# Patient Record
Sex: Male | Born: 1965
Health system: Southern US, Community
[De-identification: ages and names within clinical notes are randomized; demographics above are authoritative.]

## PROBLEM LIST (undated history)

## (undated) DIAGNOSIS — I10 Essential (primary) hypertension: Secondary | ICD-10-CM

## (undated) DIAGNOSIS — A159 Respiratory tuberculosis unspecified: Secondary | ICD-10-CM

## (undated) DIAGNOSIS — Z21 Asymptomatic human immunodeficiency virus [HIV] infection status: Secondary | ICD-10-CM

## (undated) DIAGNOSIS — F1910 Other psychoactive substance abuse, uncomplicated: Secondary | ICD-10-CM

## (undated) HISTORY — DX: Other psychoactive substance abuse, uncomplicated: F19.10

## (undated) HISTORY — DX: Essential (primary) hypertension: I10

## (undated) HISTORY — DX: Respiratory tuberculosis unspecified: A15.9

## (undated) HISTORY — DX: Asymptomatic human immunodeficiency virus (hiv) infection status: Z21

## (undated) HISTORY — PX: OTHER SURGICAL HISTORY: SHX169

---

## 2019-07-02 IMAGING — CR DG LUMBAR SPINE COMPLETE 4+V
5 series · 5 of 5 positions shown · non-contrast
Comparison: None.

CLINICAL DATA: Lumbar back pain.

EXAM:
LUMBAR SPINE - COMPLETE 4+ VIEW

[l-spine ap]
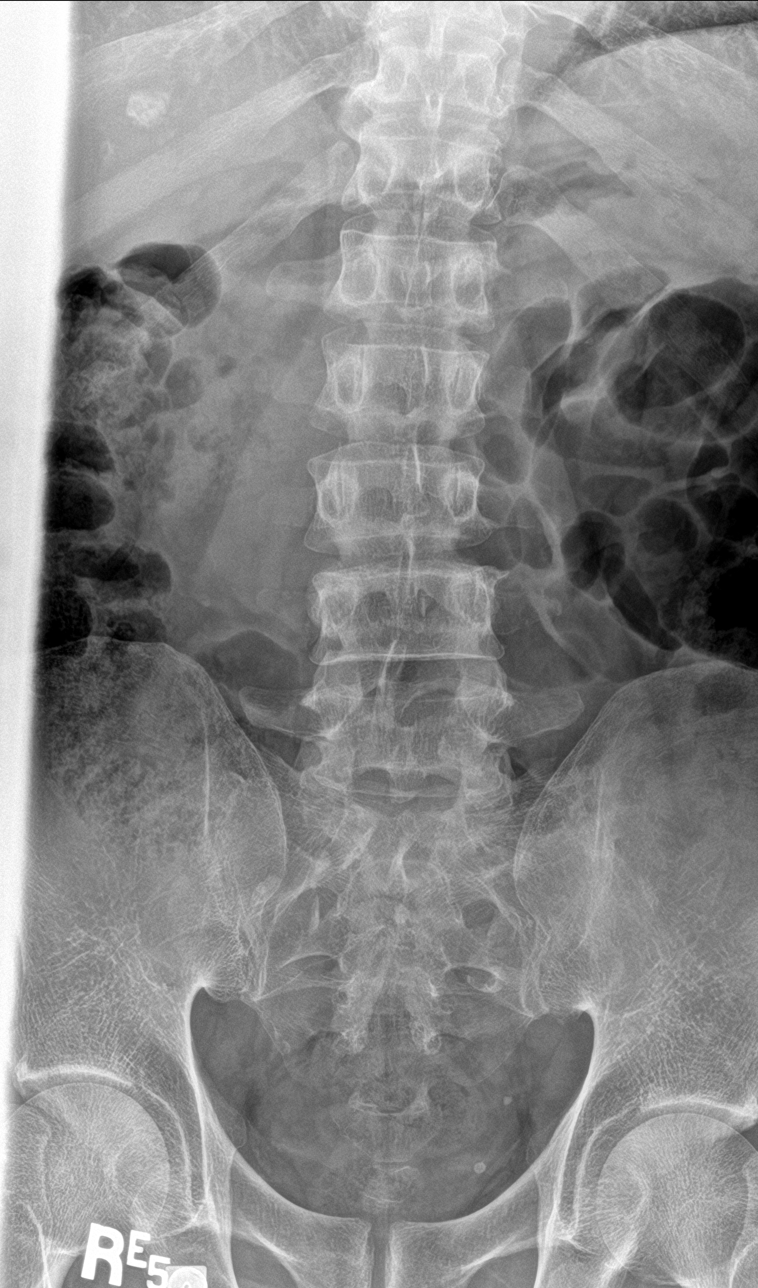

[l-spine obl (1 of 2)]
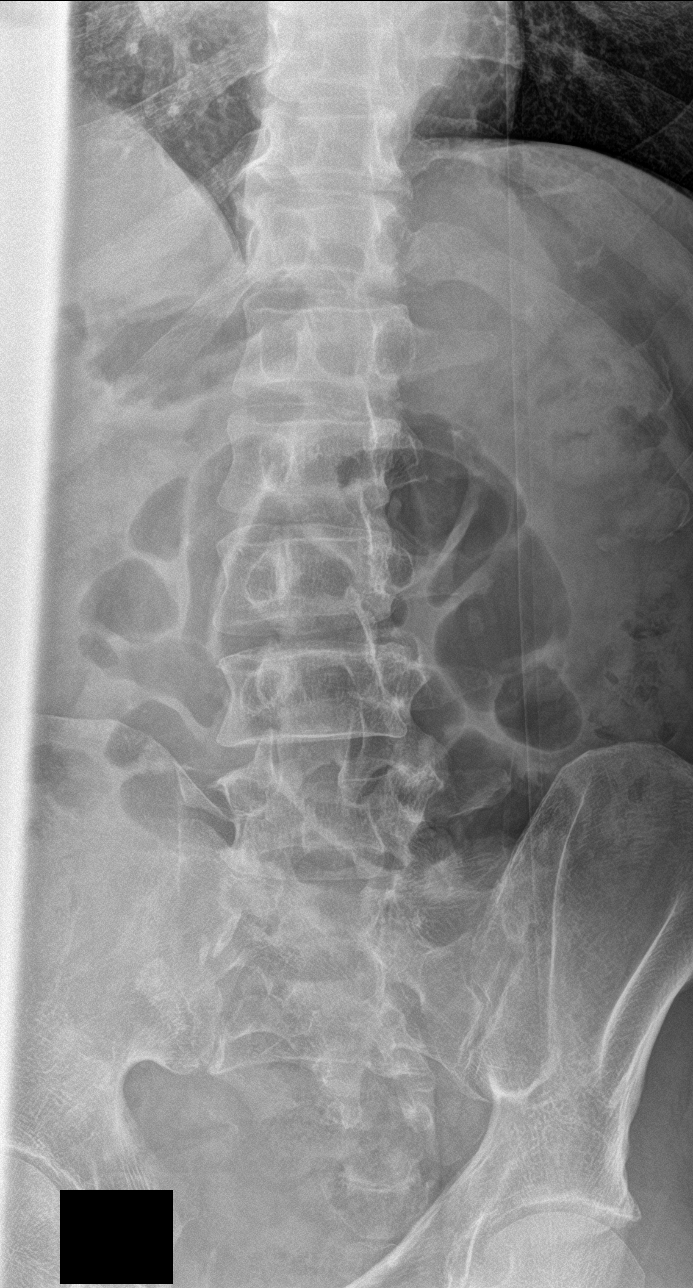

[l-spine obl (2 of 2)]
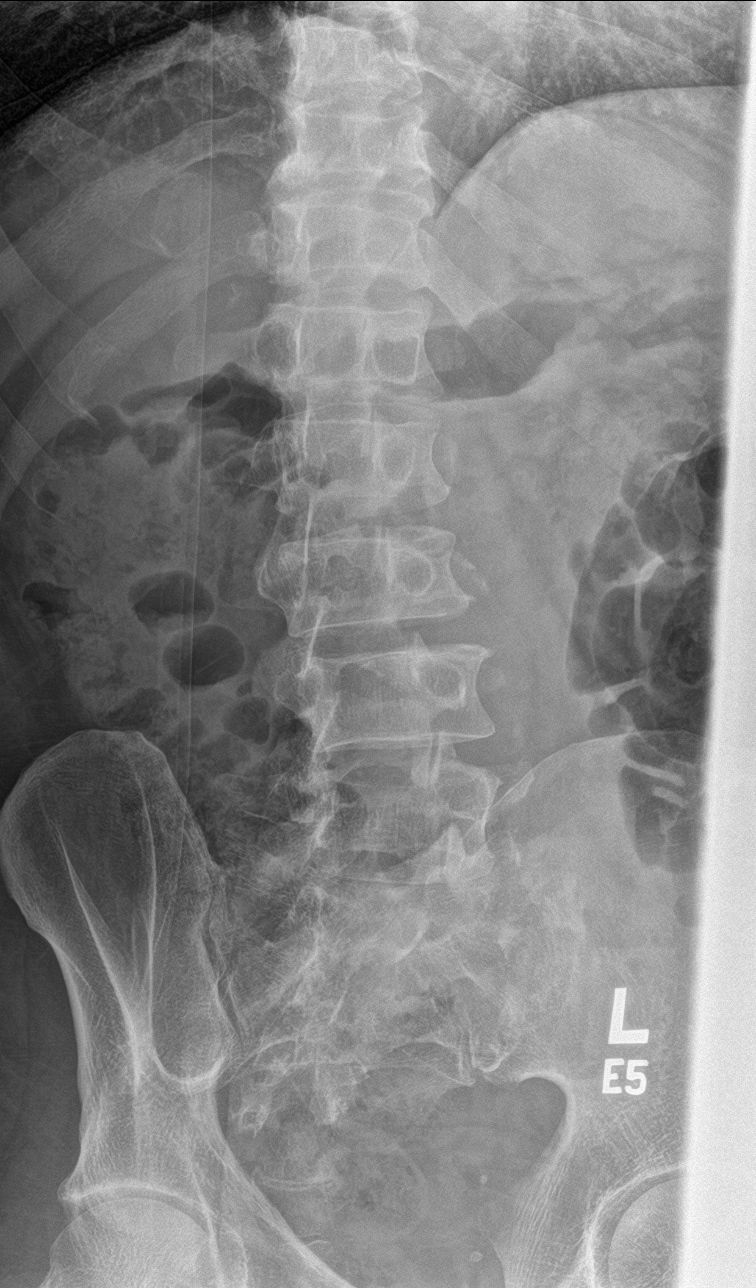

[l-spine lat]
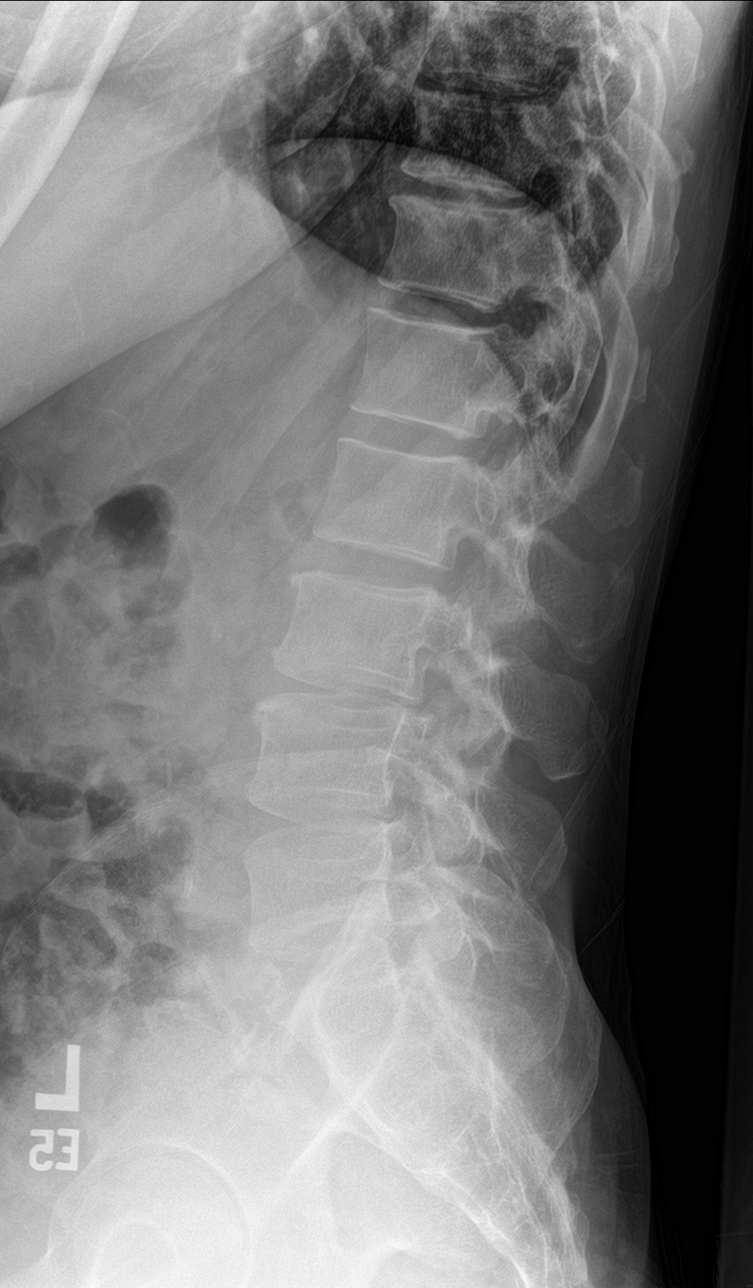

[l-spine spot]
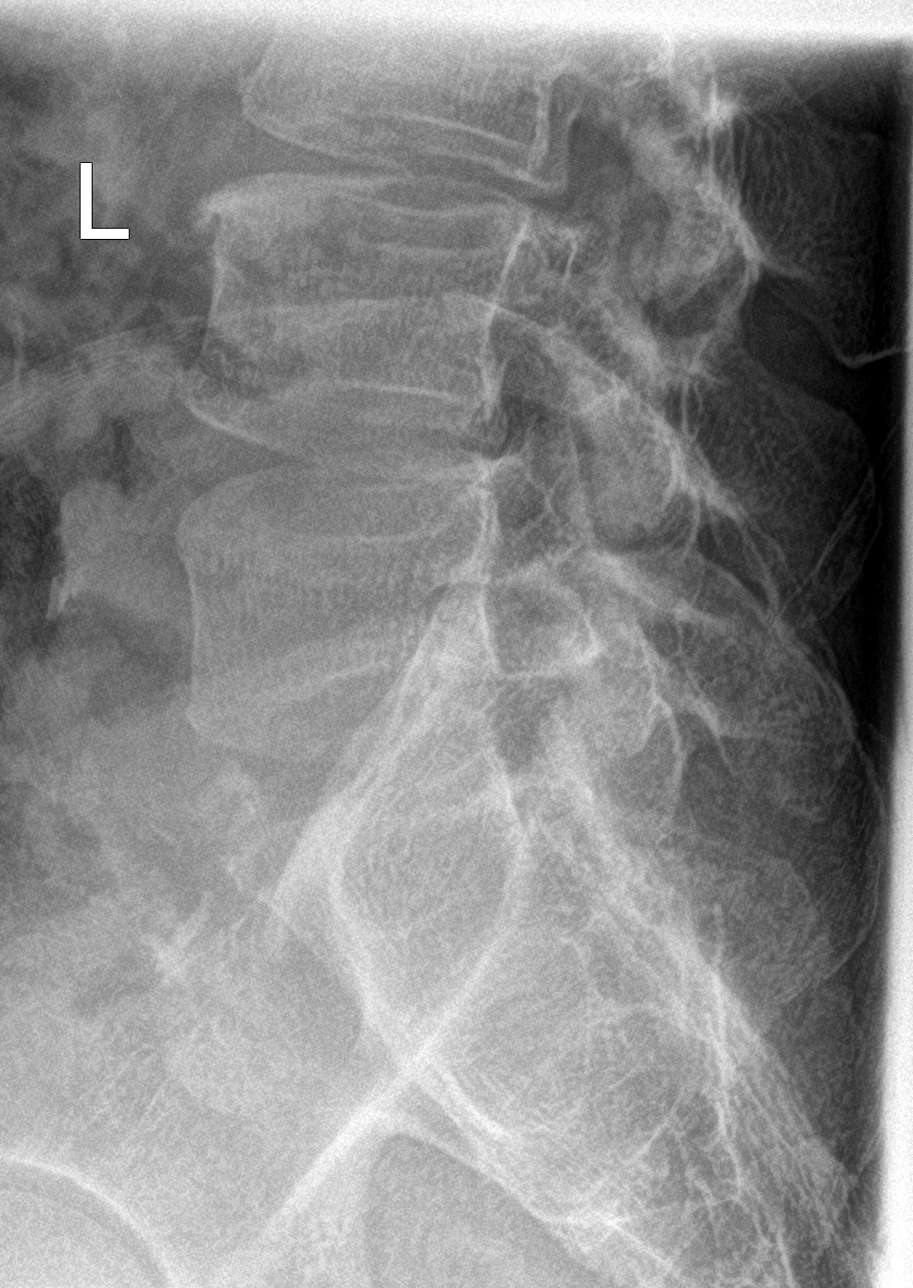

[5 of 5 positions shown; findings below may reference images not displayed]

FINDINGS: There is no evidence for acute fracture. Alignment is anatomic. Disc
spaces are preserved. There are mild degenerative endplate
osteophytes of the mid and lower lumbar spine. Soft tissues are
within normal limits. No focal osseous lesions are seen.
IMPRESSION: No acute fracture or malalignment.

Minimal degenerative changes.

## 2020-09-17 ENCOUNTER — Encounter: Payer: Self-pay | Admitting: Critical Care Medicine

## 2020-09-18 NOTE — Progress Notes (Addendum)
Patient ID: Paul Blake, male   DOB: 1965/08/07, 55 y.o.   MRN: [id number] This is a 55 year old male who is an new resident for 1 week at the [facility].  He just got out of prison from a correctional facility in [place] February 15.  Patient has HIV for the past 33 years and is currently on triple therapy and has an upcoming appointment March 17 at the HIV [facility] clinic.  He does have enough supply medications to last until that visit.  He brings with him a stack of medical records during his prison term.  He was followed closely by a physician in the prison system.  He is triple vaccinated with Materna vaccine.  Patient also has history of hypertension was on hydrochlorthiazide however this was stopped during the imprisonment last blood pressure in the prison was 120/70.  The patient does complain of some headaches  On arrival blood pressure 154/96 pulse 97 saturation 97% room air  Patient is a well-appearing male in no acute distress chest is clear cardiac exam unremarkable Neurologic exam unremarkable  Plan is to get this patient to my clinic to establish for primary care and then as well get this patient in 2 [facility] for his HIV treatment he has very poor dentition including several cavities he had some dental work done in the prison system which was not successful apparently there is dental care access to [employer] and we will endeavor to access that care if not he will have to obtain an orange card  As to the blood pressure will observe for now I have asked the nurse to check several blood pressures on him if he remains elevated we will resume hydrochlorothiazide therapy for his blood pressure

## 2020-09-25 ENCOUNTER — Ambulatory Visit: Payer: Self-pay

## 2020-09-25 ENCOUNTER — Other Ambulatory Visit: Payer: Self-pay

## 2020-09-25 ENCOUNTER — Other Ambulatory Visit (HOSPITAL_COMMUNITY)
Admission: RE | Admit: 2020-09-25 | Discharge: 2020-09-25 | Disposition: A | Discharge status: Home / Self Care | Payer: Medicaid Other | Source: Ambulatory Visit | Attending: Internal Medicine | Admitting: Internal Medicine

## 2020-09-25 DIAGNOSIS — Z79899 Other long term (current) drug therapy: Secondary | ICD-10-CM

## 2020-09-25 DIAGNOSIS — B20 Human immunodeficiency virus [HIV] disease: Secondary | ICD-10-CM | POA: Insufficient documentation

## 2020-09-25 DIAGNOSIS — Z113 Encounter for screening for infections with a predominantly sexual mode of transmission: Secondary | ICD-10-CM | POA: Insufficient documentation

## 2020-09-26 ENCOUNTER — Encounter: Payer: Self-pay | Admitting: Internal Medicine

## 2020-09-26 LAB — URINE CYTOLOGY ANCILLARY ONLY
Chlamydia: NEGATIVE
Comment: NEGATIVE
Comment: NORMAL
Neisseria Gonorrhea: NEGATIVE

## 2020-09-26 LAB — URINALYSIS
Bilirubin Urine: NEGATIVE
Glucose, UA: NEGATIVE
Hgb urine dipstick: NEGATIVE
Ketones, ur: NEGATIVE
Leukocytes,Ua: NEGATIVE
Nitrite: NEGATIVE
Protein, ur: NEGATIVE
Specific Gravity, Urine: 1.011 (ref 1.001–1.03)
pH: 7 (ref 5.0–8.0)

## 2020-09-26 LAB — T-HELPER CELL (CD4) - (RCID CLINIC ONLY)
CD4 % Helper T Cell: 18 % — ABNORMAL LOW (ref 33–65)
CD4 T Cell Abs: 283 /uL — ABNORMAL LOW (ref 400–1790)

## 2020-09-30 LAB — CBC WITH DIFFERENTIAL/PLATELET
Absolute Monocytes: 463 cells/uL (ref 200–950)
Basophils Absolute: 52 cells/uL (ref 0–200)
Basophils Relative: 1 %
Eosinophils Absolute: 109 cells/uL (ref 15–500)
Eosinophils Relative: 2.1 %
HCT: 43.1 % (ref 38.5–50.0)
Hemoglobin: 14.5 g/dL (ref 13.2–17.1)
Lymphs Abs: 1664 cells/uL (ref 850–3900)
MCH: 28.7 pg (ref 27.0–33.0)
MCHC: 33.6 g/dL (ref 32.0–36.0)
MCV: 85.2 fL (ref 80.0–100.0)
MPV: 12 fL (ref 7.5–12.5)
Monocytes Relative: 8.9 %
Neutro Abs: 2912 cells/uL (ref 1500–7800)
Neutrophils Relative %: 56 %
Platelets: 170 10*3/uL (ref 140–400)
RBC: 5.06 10*6/uL (ref 4.20–5.80)
RDW: 14.3 % (ref 11.0–15.0)
Total Lymphocyte: 32 %
WBC: 5.2 10*3/uL (ref 3.8–10.8)

## 2020-09-30 LAB — QUANTIFERON-TB GOLD PLUS
Mitogen-NIL: >10 IU/mL
NIL: 0.2 IU/mL
QuantiFERON-TB Gold Plus: POSITIVE — AB
TB1-NIL: 2.89 IU/mL
TB2-NIL: 2.89 IU/mL

## 2020-09-30 LAB — COMPLETE METABOLIC PANEL WITH GFR
AG Ratio: 1.8 (calc) (ref 1.0–2.5)
ALT: 55 U/L — ABNORMAL HIGH (ref 9–46)
AST: 30 U/L (ref 10–35)
Albumin: 4.6 g/dL (ref 3.6–5.1)
Alkaline phosphatase (APISO): 82 U/L (ref 35–144)
BUN: 11 mg/dL (ref 7–25)
CO2: 28 mmol/L (ref 20–32)
Calcium: 9.6 mg/dL (ref 8.6–10.3)
Chloride: 107 mmol/L (ref 98–110)
Creat: 0.81 mg/dL (ref 0.70–1.33)
GFR, Est African American: 118 mL/min/{1.73_m2} (ref >=60)
GFR, Est Non African American: 101 mL/min/{1.73_m2} (ref >=60)
Globulin: 2.6 g/dL (calc) (ref 1.9–3.7)
Glucose, Bld: 93 mg/dL (ref 65–99)
Potassium: 4.2 mmol/L (ref 3.5–5.3)
Sodium: 141 mmol/L (ref 135–146)
Total Bilirubin: 0.5 mg/dL (ref 0.2–1.2)
Total Protein: 7.2 g/dL (ref 6.1–8.1)

## 2020-09-30 LAB — LIPID PANEL
Cholesterol: 210 mg/dL — ABNORMAL HIGH (ref <200)
HDL: 55 mg/dL (ref >=40)
LDL Cholesterol (Calc): 129 mg/dL (calc) — ABNORMAL HIGH
Non-HDL Cholesterol (Calc): 155 mg/dL (calc) — ABNORMAL HIGH (ref <130)
Total CHOL/HDL Ratio: 3.8 (calc) (ref <5.0)
Triglycerides: 144 mg/dL (ref <150)

## 2020-09-30 LAB — HIV-1 RNA ULTRAQUANT REFLEX TO GENTYP+
HIV 1 RNA Quant: NOT DETECTED copies/mL
HIV-1 RNA Quant, Log: NOT DETECTED Log copies/mL

## 2020-09-30 LAB — HEPATITIS B SURFACE ANTIGEN: Hepatitis B Surface Ag: NONREACTIVE

## 2020-09-30 LAB — RPR: RPR Ser Ql: NONREACTIVE

## 2020-09-30 LAB — HIV ANTIBODY (ROUTINE TESTING W REFLEX): HIV 1&2 Ab, 4th Generation: REACTIVE — AB

## 2020-09-30 LAB — HLA B*5701: HLA-B*5701 w/rflx HLA-B High: NEGATIVE

## 2020-09-30 LAB — HEPATITIS B CORE ANTIBODY, TOTAL: Hep B Core Total Ab: NONREACTIVE

## 2020-09-30 LAB — HEPATITIS C ANTIBODY
Hepatitis C Ab: NONREACTIVE
SIGNAL TO CUT-OFF: 0.01 (ref <1.00)

## 2020-09-30 LAB — HEPATITIS B SURFACE ANTIBODY,QUALITATIVE: Hep B S Ab: NONREACTIVE

## 2020-09-30 LAB — HIV-1/2 AB - DIFFERENTIATION
HIV-1 antibody: POSITIVE — AB
HIV-2 Ab: NEGATIVE

## 2020-09-30 LAB — HEPATITIS A ANTIBODY, TOTAL: Hepatitis A AB,Total: REACTIVE — AB

## 2020-10-01 ENCOUNTER — Encounter: Payer: Self-pay | Admitting: *Deleted

## 2020-10-01 ENCOUNTER — Other Ambulatory Visit (HOSPITAL_COMMUNITY): Payer: Self-pay | Admitting: Critical Care Medicine

## 2020-10-01 ENCOUNTER — Other Ambulatory Visit: Payer: Self-pay | Admitting: Critical Care Medicine

## 2020-10-01 ENCOUNTER — Encounter: Payer: Self-pay | Admitting: Critical Care Medicine

## 2020-10-01 MED ORDER — VALSARTAN-HYDROCHLOROTHIAZIDE 80-12.5 MG PO TABS: 1.0000 | ORAL_TABLET | Freq: Every day | ORAL | 1 refills | Status: DC

## 2020-10-01 MED FILL — VALSARTAN-HCTZ 80-12.5 MG T: 80-12.5 | 30 days supply | Qty: 30 | Fill #0

## 2020-10-01 NOTE — Progress Notes (Signed)
Patient ID: Paul Blake, male   DOB: [birth year], 55 y.o.   MRN: [id number] This is a 55 year old male history of HIV seen at the [facility] clinic.  He has had his initial intake at the [facility] and will have his physician visit in a week.  The patient currently has labs showing no viral load and all of his other S TI infections are negative  On arrival blood pressure 160/110 pulse 75 saturation 98% room air patient's exam is unremarkable  Impression is that of hypertension with HIV  Plan is for the patient to begin valsartan HCT 80/12-1/2 1 daily prescription will be sent to the pharmacy under the nurse find  The patient has an established visit with me March 29 in the clinic and he is encouraged to keep this follow-up appointment

## 2020-10-01 NOTE — Progress Notes (Signed)
valsa

## 2020-10-01 NOTE — Congregational Nurse Program (Signed)
  Dept: [phone]   Congregational Nurse Program Note  Date of Encounter: 10/01/2020  Past Medical History: No past medical history on file.  Encounter Details:  CNP Questionnaire - 10/01/20 1049      Questionnaire   Do you give verbal consent to treat you today? Yes    Visit Setting Other    Location [facility] At [facility]    Patient Status Homeless    Medical Provider No    Insurance Medicaid    Intervention Educate    Housing/Utilities No permanent housing    Transportation Within past 12 months, lack of transportation negatively impacted life    Interpersonal Safety Do not feel physically and emotionally safe where you currently live    Food Have food insecurities           Pt presents and is confused about upcoming appts, wrote down map and directions, address, ph# to [facility]. Wrote down date and time for dr Delford Field [facility]. And next visit w/ [facility].  He states he would also like to see dr Delford Field this pm for continued h/a's, he is first on list

## 2020-10-08 ENCOUNTER — Telehealth: Payer: Self-pay

## 2020-10-08 NOTE — Telephone Encounter (Signed)
[facility] Patient Advocate Encounter ? ?Insurance verification completed.   ? ?The patient is uninsured and will need patient assistance for medication. ? ?We can complete the application and will need to meet with the patient for signatures and income documentation. ? ?Izzah Pasqua, CPhT ?Specialty Pharmacy Patient Advocate ?[facility] ?Phone: [phone] ?Fax:  [phone]  ?

## 2020-10-09 ENCOUNTER — Ambulatory Visit (INDEPENDENT_AMBULATORY_CARE_PROVIDER_SITE_OTHER): Payer: Self-pay | Admitting: Internal Medicine

## 2020-10-09 ENCOUNTER — Other Ambulatory Visit: Payer: Self-pay

## 2020-10-09 ENCOUNTER — Encounter: Payer: Self-pay | Admitting: Internal Medicine

## 2020-10-09 VITALS — Wt 168.0 lb

## 2020-10-09 DIAGNOSIS — Z23 Encounter for immunization: Secondary | ICD-10-CM

## 2020-10-09 DIAGNOSIS — B20 Human immunodeficiency virus [HIV] disease: Secondary | ICD-10-CM

## 2020-10-09 MED ORDER — BICTEGRAVIR-EMTRICITAB-TENOFOV 50-200-25 MG PO TABS: 1.0000 | ORAL_TABLET | Freq: Every day | ORAL | 11 refills | Status: DC

## 2020-10-09 NOTE — Addendum Note (Signed)
Addended byRutha Bouchard T on: 10/09/2020 02:49 PM   Modules accepted: Orders

## 2020-10-09 NOTE — Progress Notes (Addendum)
[facility]  Reason for Consult: new patient to continue HIV care    There are no problems to display for this patient.     HPI: Paul Blake is a 55 y.o. male here to establish hiv care  He is currently well controlled on prezcobix/descovy  #hiv -dx'ed 1991; got it from his wife; no ivdu -remembers taking bactrim for a long time; but doesn't remember any OI history -previous HIV provider [facility] Lenise Arena most recent prior to going to [place]) -therapy Approximately 2016-Currently on prezcobix/descovy Doesn't recall switching ART regimen previously due to failure Previous therapy include kaletra, atazanavir, lamivudine, truvada, combivir, azt -still have medication from prison  #positive quantiferon gold testing/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 Took inh for 6-9 months 1980s -currently no cough, chest pain, sob -endorse some weight loss (185 lbs --> 169 lbs since prison) - he thinks stress/food related from prison -had good appetite before, but not as good since prison time, lots of stress now looking for place to stay/job -no fever/chill/nightsweat, rash -have chronic knees pain no new joint pain  #diarrhea/fatigue -ate something from [facility] [employer] 2 days ago and developed diarrhea/fatigue, prior to 10/09/2020 visit -diarrhea 2 x/day  -no n/v/abd pain (just the first day -no blood in stool  -the course is improving   #htn -he doesn't really know about this much -med list show hctz   #social -he was recently incarcerated in [place] (2020-2021) prior to transfer here -his wife passed away from AIDS 1990s -just relocated from [place] 08/2020 -no relative here; staying at [employer] -in the process of getting a job; he previously drive trucks in [place] -not in active sexual relationship at this time -smoking; no etoh; no street drugs  Review of Systems: ROS Negative other  ROS      PMH: htn hiv  Social History   Tobacco Use  . Smoking status: Current Every Day Smoker    Types: Cigarettes  . Smokeless tobacco: Never Used  Substance Use Topics  . Alcohol use: Not Currently    Fam hx: No cancer, dm2, rheumatologic disease  OBJECTIVE: Vitals:   10/09/20 1356  Weight: 168 lb (76.2 kg)   There is no height or weight on file to calculate BMI.   Physical Exam Constitutional:      Appearance: He is obese.  HENT:     Head: Normocephalic and atraumatic.     Mouth/Throat:     Mouth: Mucous membranes are moist.     Pharynx: Oropharynx is clear.  Eyes:     Conjunctiva/sclera: Conjunctivae normal.     Pupils: Pupils are equal, round, and reactive to light.  Cardiovascular:     Rate and Rhythm: Normal rate and regular rhythm.     Pulses: Normal pulses.     Heart sounds: Normal heart sounds.  Pulmonary:     Effort: Pulmonary effort is normal.     Breath sounds: Normal breath sounds.  Abdominal:     General: Abdomen is flat.     Palpations: Abdomen is soft.  Musculoskeletal:        General: Normal range of motion.     Cervical back: Normal range of motion.  Skin:    General: Skin is warm.     Findings: No rash.  Neurological:     General: No focal deficit present.     Mental Status: He is alert and oriented to person, place, and time.  Cranial Nerves: No cranial nerve deficit.  Psychiatric:        Mood and Affect: Mood normal.        Behavior: Behavior normal.     Lab: Lab Results  Component Value Date   WBC 5.2 09/25/2020   HGB 14.5 09/25/2020   HCT 43.1 09/25/2020   MCV 85.2 09/25/2020   PLT 170 09/25/2020     Chemistry      Component Value Date/Time   NA 141 09/25/2020 0950   K 4.2 09/25/2020 0950   CL 107 09/25/2020 0950   CO2 28 09/25/2020 0950   BUN 11 09/25/2020 0950   CREATININE 0.81 09/25/2020 0950      Component Value Date/Time   CALCIUM 9.6 09/25/2020 0950   AST 30 09/25/2020 0950   ALT 55 (H)  09/25/2020 0950   BILITOT 0.5 09/25/2020 0950     Lab Results  Component Value Date   CHOL 210 (H) 09/25/2020   HDL 55 09/25/2020   LDLCALC 129 (H) 09/25/2020   TRIG 144 09/25/2020   CHOLHDL 3.8 09/25/2020    hiv          vl      /     cd4 (%) 09/25/20   UD    /     283 (18) 08/220    166 2020-2021 UD with blips here and there  Microbiology:  Serology: 09/25/2020 Quant gold Positive Urine gc/chlam negative rpr nonreactive Hep c Ab, hep b sAg/sAb/cAb nonreactive Hep a Ab reactive  Imaging:   Assessment/plan: Problem List Items Addressed This Visit   None   Visit Diagnoses    HIV disease (HCC)    -  Primary   Encounter for vaccination         #hiv -current therapy prezcobix, descovy; discuss switching to biktarvy. doenst appear prior VF or need to switch regimen due to that. 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 No sx  #recent diarrheal illness Improving Continue to monitor  #hcm -immunization covid vaccine x2 by 11/2019 -hepatitis Previous hepatitis B vaccine 2018; 09/25/2020 hep B sAb negative; will repeat hep b vaccine today 10/09/2020 with heplasav Hep A vaccine 2019 Pneumovax 04/2017 prevnar to give today 10/09/2020 Will save tdap for next visit as he is feeling under the weather at this time   I spent 60 minute reviewing data/chart, and coordinating care and >50% direct face to face time providing counseling/discussing diagnostics/treatment plan with patient    Follow-up: Return in about 5 weeks (around 11/13/2020).  [name], MD [facility] for Infectious Disease [facility] [phone] pager   [phone] cell 10/09/2020, 2:10 PM

## 2020-10-09 NOTE — Addendum Note (Signed)
Addended by: Lorenso Courier on: 10/09/2020 04:18 PM   Modules accepted: Orders

## 2020-10-09 NOTE — Patient Instructions (Signed)
Your hiv viral load is undetectable  Start biktarvy today (1 pill once a day); recheck blood test in 1 month; follow up with me 1 week after (5-6 weeks)  Vaccination today Pneumonia (prevnar) Hepatitis b (heplisav) shot 1 of 2; next visit can get the 2nd dose of hepatitis b vaccine Will also consider given tetatnus booster on next visit

## 2020-10-21 ENCOUNTER — Other Ambulatory Visit: Payer: Self-pay

## 2020-10-21 ENCOUNTER — Encounter: Payer: Self-pay | Admitting: Critical Care Medicine

## 2020-10-21 ENCOUNTER — Other Ambulatory Visit: Payer: Self-pay | Admitting: Critical Care Medicine

## 2020-10-21 ENCOUNTER — Ambulatory Visit: Payer: Self-pay | Attending: Critical Care Medicine | Admitting: Critical Care Medicine

## 2020-10-21 VITALS — BP 135/84 | HR 101 | Resp 20 | Ht 70.0 in | Wt 167.0 lb

## 2020-10-21 DIAGNOSIS — I10 Essential (primary) hypertension: Secondary | ICD-10-CM | POA: Insufficient documentation

## 2020-10-21 DIAGNOSIS — R7611 Nonspecific reaction to tuberculin skin test without active tuberculosis: Secondary | ICD-10-CM | POA: Insufficient documentation

## 2020-10-21 DIAGNOSIS — Z21 Asymptomatic human immunodeficiency virus [HIV] infection status: Secondary | ICD-10-CM | POA: Insufficient documentation

## 2020-10-21 DIAGNOSIS — B20 Human immunodeficiency virus [HIV] disease: Secondary | ICD-10-CM | POA: Insufficient documentation

## 2020-10-21 MED ORDER — VALSARTAN-HYDROCHLOROTHIAZIDE 80-12.5 MG PO TABS
1.0000 | ORAL_TABLET | Freq: Every day | ORAL | 1 refills | Status: DC
Start: 2020-10-21 — End: 2020-11-04

## 2020-10-21 NOTE — Assessment & Plan Note (Signed)
Primary hypertension at goal now continue valsartan HCT 80/12.5     electrolytes kidney function normal

## 2020-10-21 NOTE — Progress Notes (Signed)
Subjective:    Patient ID: Paul Blake, male    DOB: 1965/09/24, 55 y.o.   MRN: [id number]  55 y.o.M HTN HIV+  Here to est PCP.  10/21/2020 This patient was seen previously at the [facility] clinic and is here to establish primary care.  He has been in with infectious disease for his HIV.  He is now on Biktarvy and his viral load is quite low at this time.  He is still smoking a pack and half a day of cigarettes. Previously was seen at the [facility] clinic as documented below he now lives at the [facility]  Seen at [facility] now at [facility]: This is a 55 year old male who is an new resident for 1 week at the [facility].  He just got out of prison from a correctional facility in [place] February 15.  Patient has HIV for the past 33 years and is currently on triple therapy and has an upcoming appointment March 17 at the [facility].  He does have enough supply medications to last until that visit.  He brings with him a stack of medical records during his prison term.  He was followed closely by a physician in the prison system.  He is triple vaccinated with Materna vaccine.  Patient also has history of hypertension was on hydrochlorthiazide however this was stopped during the imprisonment last blood pressure in the prison was 120/70.  The patient does complain of some headaches Plan is to get this patient to my clinic to establish for primary care and then as well get this patient in 2 [facility] for his HIV treatment he has very poor dentition including several cavities he had some dental work done in the prison system which was not successful apparently there is dental care access to [employer] and we will endeavor to access that care if not he will have to obtain an orange card  Patient has been on valsartan HCT 80/12-1/2 since being seen at the shelter.  Blood pressure is improved today on arrival 135/84 which is  at goal  Patient has no other complaints at this time  Past Medical History:  Diagnosis Date  . Hypertension   . Tuberculosis      History reviewed. No pertinent family history.   Social History   Socioeconomic History  . Marital status: Single    Spouse name: Not on file  . Number of children: Not on file  . Years of education: Not on file  . Highest education level: Not on file  Occupational History  . Not on file  Tobacco Use  . Smoking status: Current Every Day Smoker    Types: Cigarettes  . Smokeless tobacco: Never Used  Substance and Sexual Activity  . Alcohol use: Not Currently  . Drug use: Not on file  . Sexual activity: Not on file  Other Topics Concern  . Not on file  Social History Narrative  . Not on file   Social Determinants of Health   Financial Resource Strain: Not on file  Food Insecurity: Not on file  Transportation Needs: Not on file  Physical Activity: Not on file  Stress: Not on file  Social Connections: Not on file  Intimate Partner Violence: Not on file     Not on File   Outpatient Medications Prior to Visit  Medication Sig Dispense Refill  . bictegravir-emtricitabine-tenofovir AF (BIKTARVY) 50-200-25 MG TABS tablet Take 1 tablet  by mouth daily. 30 tablet 11  . valsartan-hydrochlorothiazide (DIOVAN HCT) 80-12.5 MG tablet Take 1 tablet by mouth daily. 30 tablet 1   No facility-administered medications prior to visit.     Review of Systems  HENT: Negative.   Respiratory: Negative.   Cardiovascular: Negative.   Gastrointestinal: Negative.   Genitourinary: Negative.   Musculoskeletal: Negative.   Neurological: Negative.   Psychiatric/Behavioral: Negative.        Objective:   Physical Exam Vitals:   10/21/20 1459  BP: 135/84  Pulse: (!) 101  Resp: 20  SpO2: 99%  Weight: 167 lb (75.8 kg)  Height: 5\' 10"  (1.778 m)    Gen: Pleasant, well-nourished, in no distress,  normal affect  ENT: No lesions,  mouth clear,   oropharynx clear, no postnasal drip  Neck: No JVD, no TMG, no carotid bruits  Lungs: No use of accessory muscles, no dullness to percussion, clear without rales or rhonchi  Cardiovascular: RRR, heart sounds normal, no murmur or gallops, no peripheral edema  Abdomen: soft and NT, no HSM,  BS normal  Musculoskeletal: No deformities, no cyanosis or clubbing  Neuro: alert, non focal  Skin: Warm, no lesions or rashes        Assessment & Plan:  I personally reviewed all images and lab data in the [facility] system as well as any outside material available during this office visit and agree with the  radiology impressions.   HIV (human immunodeficiency virus infection) (HCC) HIV currently asymptomatic his CD4 counts are good and his viral load is low  Continue Biktarvy and follow-up per infectious disease  Positive PPD, treated Positive PPD and QuantiFERON gold previously treated for 6 months with INH in the criminal justice system  HTN (hypertension) Primary hypertension at goal now continue valsartan HCT 80/12.5     electrolytes kidney function normal   [place] was seen today for establish care.  Diagnoses and all orders for this visit:  Asymptomatic HIV infection, with no history of HIV-related illness (HCC)  Positive PPD, treated  Primary hypertension  Other orders -     valsartan-hydrochlorothiazide (DIOVAN HCT) 80-12.5 MG tablet; Take 1 tablet by mouth daily.   Tdap given at this visit

## 2020-10-21 NOTE — Patient Instructions (Signed)
You are on valsartan HCT for your blood pressure continue to stay on this medication for now,  Future refills of your blood pressure medicine are sent to our pharmacy here at the clinic this is where you get your medicine refilled for future  Follow a low-salt diet as outlined below  Tetanus shot was given  Try to reduce the amount of tobacco consumption you are you currently using by at least 25% to help reduce the blood pressure  Return to see Dr. Delford Field 4 months   https://www.mata.com/.pdf">  DASH Eating Plan DASH stands for Dietary Approaches to Stop Hypertension. The DASH eating plan is a healthy eating plan that has been shown to:  Reduce high blood pressure (hypertension).  Reduce your risk for type 2 diabetes, heart disease, and stroke.  Help with weight loss. What are tips for following this plan? Reading food labels  Check food labels for the amount of salt (sodium) per serving. Choose foods with less than 5 percent of the Daily Value of sodium. Generally, foods with less than 300 milligrams (mg) of sodium per serving fit into this eating plan.  To find whole grains, look for the word "whole" as the first word in the ingredient list. Shopping  Buy products labeled as "low-sodium" or "no salt added."  Buy fresh foods. Avoid canned foods and pre-made or frozen meals. Cooking  Avoid adding salt when cooking. Use salt-free seasonings or herbs instead of table salt or sea salt. Check with your health care provider or pharmacist before using salt substitutes.  Do not fry foods. Cook foods using healthy methods such as baking, boiling, grilling, roasting, and broiling instead.  Cook with heart-healthy oils, such as olive, canola, avocado, soybean, or sunflower oil. Meal planning  Eat a balanced diet that includes: ? 4 or more servings of fruits and 4 or more servings of vegetables each day. Try to fill one-half of your plate with  fruits and vegetables. ? 6-8 servings of whole grains each day. ? Less than 6 oz (170 g) of lean meat, poultry, or fish each day. A 3-oz (85-g) serving of meat is about the same size as a deck of cards. One egg equals 1 oz (28 g). ? 2-3 servings of low-fat dairy each day. One serving is 1 cup (237 mL). ? 1 serving of nuts, seeds, or beans 5 times each week. ? 2-3 servings of heart-healthy fats. Healthy fats called omega-3 fatty acids are found in foods such as walnuts, flaxseeds, fortified milks, and eggs. These fats are also found in cold-water fish, such as sardines, salmon, and mackerel.  Limit how much you eat of: ? Canned or prepackaged foods. ? Food that is high in trans fat, such as some fried foods. ? Food that is high in saturated fat, such as fatty meat. ? Desserts and other sweets, sugary drinks, and other foods with added sugar. ? Full-fat dairy products.  Do not salt foods before eating.  Do not eat more than 4 egg yolks a week.  Try to eat at least 2 vegetarian meals a week.  Eat more home-cooked food and less restaurant, buffet, and fast food.   Lifestyle  When eating at a restaurant, ask that your food be prepared with less salt or no salt, if possible.  If you drink alcohol: ? Limit how much you use to:  0-1 drink a day for women who are not pregnant.  0-2 drinks a day for men. ? Be aware of how much  alcohol is in your drink. In the [place]S., one drink equals one 12 oz bottle of beer (355 mL), one 5 oz glass of wine (148 mL), or one 1 oz glass of hard liquor (44 mL). General information  Avoid eating more than 2,300 mg of salt a day. If you have hypertension, you may need to reduce your sodium intake to 1,500 mg a day.  Work with your health care provider to maintain a healthy body weight or to lose weight. Ask what an ideal weight is for you.  Get at least 30 minutes of exercise that causes your heart to beat faster (aerobic exercise) most days of the week.  Activities may include walking, swimming, or biking.  Work with your health care provider or dietitian to adjust your eating plan to your individual calorie needs. What foods should I eat? Fruits All fresh, dried, or frozen fruit. Canned fruit in natural juice (without added sugar). Vegetables Fresh or frozen vegetables (raw, steamed, roasted, or grilled). Low-sodium or reduced-sodium tomato and vegetable juice. Low-sodium or reduced-sodium tomato sauce and tomato paste. Low-sodium or reduced-sodium canned vegetables. Grains Whole-grain or whole-wheat bread. Whole-grain or whole-wheat pasta. Brown rice. Orpah Cobb. Bulgur. Whole-grain and low-sodium cereals. Pita bread. Low-fat, low-sodium crackers. Whole-wheat flour tortillas. Meats and other proteins Skinless chicken or [place]. Ground chicken or [place]. Pork with fat trimmed off. Fish and seafood. Egg whites. Dried beans, peas, or lentils. Unsalted nuts, nut butters, and seeds. Unsalted canned beans. Lean cuts of beef with fat trimmed off. Low-sodium, lean precooked or cured meat, such as sausages or meat loaves. Dairy Low-fat (1%) or fat-free (skim) milk. Reduced-fat, low-fat, or fat-free cheeses. Nonfat, low-sodium ricotta or cottage cheese. Low-fat or nonfat yogurt. Low-fat, low-sodium cheese. Fats and oils Soft margarine without trans fats. Vegetable oil. Reduced-fat, low-fat, or light mayonnaise and salad dressings (reduced-sodium). Canola, safflower, olive, avocado, soybean, and sunflower oils. Avocado. Seasonings and condiments Herbs. Spices. Seasoning mixes without salt. Other foods Unsalted popcorn and pretzels. Fat-free sweets. The items listed above may not be a complete list of foods and beverages you can eat. Contact a dietitian for more information. What foods should I avoid? Fruits Canned fruit in a light or heavy syrup. Fried fruit. Fruit in cream or butter sauce. Vegetables Creamed or fried vegetables. Vegetables in a  cheese sauce. Regular canned vegetables (not low-sodium or reduced-sodium). Regular canned tomato sauce and paste (not low-sodium or reduced-sodium). Regular tomato and vegetable juice (not low-sodium or reduced-sodium). Rosita Fire. Olives. Grains Baked goods made with fat, such as croissants, muffins, or some breads. Dry pasta or rice meal packs. Meats and other proteins Fatty cuts of meat. Ribs. Fried meat. Tomasa Blase. Bologna, salami, and other precooked or cured meats, such as sausages or meat loaves. Fat from the back of a pig (fatback). Bratwurst. Salted nuts and seeds. Canned beans with added salt. Canned or smoked fish. Whole eggs or egg yolks. Chicken or [place] with skin. Dairy Whole or 2% milk, cream, and half-and-half. Whole or full-fat cream cheese. Whole-fat or sweetened yogurt. Full-fat cheese. Nondairy creamers. Whipped toppings. Processed cheese and cheese spreads. Fats and oils Butter. Stick margarine. Lard. Shortening. Ghee. Bacon fat. Tropical oils, such as coconut, palm kernel, or palm oil. Seasonings and condiments Onion salt, garlic salt, seasoned salt, table salt, and sea salt. Worcestershire sauce. Tartar sauce. Barbecue sauce. Teriyaki sauce. Soy sauce, including reduced-sodium. Steak sauce. Canned and packaged gravies. Fish sauce. Oyster sauce. Cocktail sauce. Store-bought horseradish. Ketchup. Mustard. Meat flavorings and tenderizers. Bouillon  cubes. Hot sauces. Pre-made or packaged marinades. Pre-made or packaged taco seasonings. Relishes. Regular salad dressings. Other foods Salted popcorn and pretzels. The items listed above may not be a complete list of foods and beverages you should avoid. Contact a dietitian for more information. Where to find more information  National Heart, Lung, and Blood Institute: PopSteam.iswww.nhlbi.nih.gov  American Heart Association: www.heart.org  Academy of Nutrition and Dietetics: www.eatright.org  National Kidney Foundation:  www.kidney.org Summary  The DASH eating plan is a healthy eating plan that has been shown to reduce high blood pressure (hypertension). It may also reduce your risk for type 2 diabetes, heart disease, and stroke.  When on the DASH eating plan, aim to eat more fresh fruits and vegetables, whole grains, lean proteins, low-fat dairy, and heart-healthy fats.  With the DASH eating plan, you should limit salt (sodium) intake to 2,300 mg a day. If you have hypertension, you may need to reduce your sodium intake to 1,500 mg a day.  Work with your health care provider or dietitian to adjust your eating plan to your individual calorie needs. This information is not intended to replace advice given to you by your health care provider. Make sure you discuss any questions you have with your health care provider. Document Revised: 06/15/2019 Document Reviewed: 06/15/2019 Elsevier Patient Education  2021 Elsevier Inc.  Tobacco Use Disorder Tobacco use disorder (TUD) occurs when a person craves, seeks, and uses tobacco, regardless of the consequences. This disorder can cause problems with mental and physical health. It can affect your ability to have healthy relationships, and it can keep you from meeting your responsibilities at work, home, or school. Tobacco may be:  Smoked as a cigarette or cigar.  Inhaled using e-cigarettes.  Smoked in a pipe or hookah.  Chewed as smokeless tobacco.  Inhaled into the nostrils as snuff. Tobacco products contain a dangerous chemical called nicotine, which is very addictive. Nicotine triggers hormones that make the body feel stimulated and works on areas of the brain that make you feel good. These effects can make it hard for people to quit nicotine. Tobacco contains many other unsafe chemicals that can damage almost every organ in the body. Smoking tobacco also puts others in danger due to fire risk and possible health problems caused by breathing in secondhand  smoke. What are the signs or symptoms? Symptoms of TUD may include:  Being unable to slow down or stop your tobacco use.  Spending an abnormal amount of time getting or using tobacco.  Craving tobacco. Cravings may last for up to 6 months after quitting.  Tobacco use that: ? Interferes with your work, school, or home life. ? Interferes with your personal and social relationships. ? Makes you give up activities that you once enjoyed or found important.  Using tobacco even though you know that it is: ? Dangerous or bad for your health or someone else's health. ? Causing problems in your life.  Needing more and more of the substance to get the same effect (developing tolerance).  Experiencing unpleasant symptoms if you do not use the substance (withdrawal). Withdrawal symptoms may include: ? Depressed, anxious, or irritable mood. ? Difficulty concentrating. ? Increased appetite. ? Restlessness or trouble sleeping.  Using the substance to avoid withdrawal. How is this diagnosed? This condition may be diagnosed based on:  Your current and past tobacco use. Your health care provider may ask questions about how your tobacco use affects your life.  A physical exam. You may be  diagnosed with TUD if you have at least two symptoms within a 10-month period. How is this treated? This condition is treated by stopping tobacco use. Many people are unable to quit on their own and need help. Treatment may include:  Nicotine replacement therapy (NRT). NRT provides nicotine without the other harmful chemicals in tobacco. NRT gradually lowers the dosage of nicotine in the body and reduces withdrawal symptoms. NRT is available as: ? Over-the-counter gums, lozenges, and skin patches. ? Prescription mouth inhalers and nasal sprays.  Medicine that acts on the brain to reduce cravings and withdrawal symptoms.  A type of talk therapy that examines your triggers for tobacco use, how to avoid them,  and how to cope with cravings (behavioral therapy).  Hypnosis. This may help with withdrawal symptoms.  Joining a support group for others coping with TUD. The best treatment for TUD is usually a combination of medicine, talk therapy, and support groups. Recovery can be a long process. Many people start using tobacco again after stopping (relapse). If you relapse, it does not mean that treatment will not work. Follow these instructions at home: Lifestyle  Do not use any products that contain nicotine or tobacco, such as cigarettes and e-cigarettes.  Avoid things that trigger tobacco use as much as you can. Triggers include people and situations that usually cause you to use tobacco.  Avoid drinks that contain caffeine, including coffee. These may worsen some withdrawal symptoms.  Find ways to manage stress. Wanting to smoke may cause stress, and stress can make you want to smoke. Relaxation techniques such as deep breathing, meditation, and yoga may help.  Attend support groups as needed. These groups are an important part of long-term recovery for many people. General instructions  Take over-the-counter and prescription medicines only as told by your health care provider.  Check with your health care provider before taking any new prescription or over-the-counter medicines.  Decide on a friend, family member, or smoking quit-line (such as 1-800-QUIT-NOW in the U.S[place] that you can call or text when you feel the urge to smoke or when you need help coping with cravings.  Keep all follow-up visits as told by your health care provider and therapist. This is important.   Contact a health care provider if:  You are not able to take your medicines as prescribed.  Your symptoms get worse, even with treatment. Summary  Tobacco use disorder (TUD) occurs when a person craves, seeks, and uses tobacco regardless of the consequences.  This condition may be diagnosed based on your current and  past tobacco use and a physical exam.  Many people are unable to quit on their own and need help. Recovery can be a long process.  The most effective treatment for TUD is usually a combination of medicine, talk therapy, and support groups. This information is not intended to replace advice given to you by your health care provider. Make sure you discuss any questions you have with your health care provider. Document Revised: 06/29/2017 Document Reviewed: 06/29/2017 Elsevier Patient Education  2021 [employer].

## 2020-10-21 NOTE — Assessment & Plan Note (Signed)
Positive PPD and QuantiFERON gold previously treated for 6 months with INH in the criminal justice system

## 2020-10-21 NOTE — Assessment & Plan Note (Signed)
HIV currently asymptomatic his CD4 counts are good and his viral load is low  Continue Biktarvy and follow-up per infectious disease

## 2020-11-04 ENCOUNTER — Other Ambulatory Visit (HOSPITAL_COMMUNITY): Payer: Self-pay

## 2020-11-04 ENCOUNTER — Other Ambulatory Visit: Payer: Self-pay | Admitting: Critical Care Medicine

## 2020-11-04 ENCOUNTER — Telehealth: Payer: Self-pay

## 2020-11-04 MED ORDER — VALSARTAN-HYDROCHLOROTHIAZIDE 80-12.5 MG PO TABS
1.0000 | ORAL_TABLET | Freq: Every day | ORAL | 0 refills | Status: DC
  Filled 2020-11-04: qty 30, 30d supply, fill #0
  Filled 2020-12-13: qty 30, 30d supply, fill #1

## 2020-11-04 NOTE — Congregational Nurse Program (Signed)
  Dept: [phone]   Congregational Nurse Program Note  Date of Encounter: 11/04/2020  Past Medical History: Past Medical History:  Diagnosis Date  . Hypertension   . Tuberculosis     Encounter Details:  CNP Questionnaire - 11/04/20 1715      Questionnaire   Do you give verbal consent to treat you today? Yes    Visit Setting Other    Location Patient Served At [facility]    Patient Status Homeless    Medical Provider Yes    Insurance Medicaid    Intervention Counsel;Educate;Support    Housing/Utilities No permanent housing    Transportation Need transportation assistance    Medication Have medication insecurities    Referrals PCP - [place];Medication Assistance         Initial viist  With client since his move in to the [facility]. States he is adjusting to [place] and the various systems . He has been connected with a PCP and [facility] case management Fleeta Emmer [phone] . He states he is on high blood pressure medication and needs to know how to get the next refill. Nurse checked with [facility]  ,medication filled at [facility] ,nurse will send note to Dr.W fro refill and pick up , client recently got his medicaid card but doesn't have money for co -pay. 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 Has an appointment at [facility] May 14 ,2022 2:30 pm. Odette Fraction cigarette  Smoker , using bus transportation system and trying to learn the city . Was at [facility] for 1 month being here at [facility] has given him the opportunity to set his goals ,and is determined to make things happen for him.Praised him for setting goals an making things work for him.  Very confident ,very polite  man . Receives help from [employer] .Nurse explained her role and will work to get his refills on medication.  Follow  as needed.

## 2020-11-04 NOTE — Telephone Encounter (Signed)
No script for blood pressure medication only for Biktarvy  And it will be delivered on 11-06-2020. Nurse followed up with note to Dr Delford Field ,he will order  blood pressure medications and call in order for [facility] for next refill Nurse will pick up med's tomorrow  And deliver to the center.

## 2020-11-05 ENCOUNTER — Other Ambulatory Visit (HOSPITAL_COMMUNITY): Payer: Self-pay

## 2020-11-05 NOTE — Congregational Nurse Program (Signed)
  Dept: [phone]   Congregational Nurse Program Note  Date of Encounter: 11/05/2020  Past Medical History: Past Medical History:  Diagnosis Date  . Hypertension   . Tuberculosis     Encounter Details:  CNP Questionnaire - 11/05/20 1716      Questionnaire   Do you give verbal consent to treat you today? Yes    Visit Setting Other    Location Patient Served At [facility]    Patient Status Homeless    Medical Provider Yes    Insurance Medicaid    Intervention Counsel;Educate;Support    Housing/Utilities No permanent housing    Transportation Need transportation assistance    Referrals Medication Assistance         nurse assisted with medication refill/pickup and delivery.client was in hallway ,delivered his meds to him .States today has been a little disappointing but elaborate as  he was in the hallway . Follow weekly

## 2020-12-01 ENCOUNTER — Ambulatory Visit: Payer: Medicaid Other | Admitting: Internal Medicine

## 2020-12-10 ENCOUNTER — Ambulatory Visit (INDEPENDENT_AMBULATORY_CARE_PROVIDER_SITE_OTHER): Payer: Self-pay | Admitting: Internal Medicine

## 2020-12-10 ENCOUNTER — Other Ambulatory Visit: Payer: Self-pay

## 2020-12-10 ENCOUNTER — Encounter: Payer: Self-pay | Admitting: Internal Medicine

## 2020-12-10 VITALS — BP 128/88 | HR 83 | Temp 97.6°F | Ht 70.0 in | Wt 163.0 lb

## 2020-12-10 DIAGNOSIS — B20 Human immunodeficiency virus [HIV] disease: Secondary | ICD-10-CM

## 2020-12-10 DIAGNOSIS — Z23 Encounter for immunization: Secondary | ICD-10-CM

## 2020-12-10 NOTE — Patient Instructions (Signed)
Will get 1 tube of blood test today to see how you do on your new hiv medication  In 3 months will see you again (please do blood test a week prior to visit)   Vaccine today: Tetanus booster

## 2020-12-10 NOTE — Progress Notes (Signed)
[facility]  Reason: HIV care f/u  Patient Active Problem List   Diagnosis Date Noted  . HIV (human immunodeficiency virus infection) (HCC) 10/21/2020  . 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 HTN (hypertension) 10/21/2020      HPI: Paul Blake is a 55 y.o. 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 He is frustrated waiting too long for the licence to come    He first saw me on 10/09/2020 in [place] ----------------------------------------- background He is currently well controlled on prezcobix/descovy  #hiv -dx'ed 1991; got it from his wife; no ivdu -remembers taking bactrim for a long time; but doesn't remember any OI history -previous HIV provider [facility] Lenise Arena most recent prior to going to [place]) -therapy Approximately 2016-Currently on prezcobix/descovy Doesn't recall switching ART regimen previously due to failure Previous therapy include kaletra, atazanavir, lamivudine, truvada, combivir, azt -still have medication from prison  #positive quantiferon gold testing/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 Took inh for 6-9 months 1980s -currently no cough, chest pain, sob -endorse some weight loss (185 lbs --> 169 lbs since prison) - he thinks stress/food related from prison -had good appetite before, but not as good since prison time, lots of stress now looking for place to stay/job -no fever/chill/nightsweat, rash -have  chronic knees pain no new joint pain  #htn -taking hctz -continue to see pcp dr Paul Blake   #social -he was recently incarcerated in [place] (2020-2021) prior to transfer here -his wife passed away from AIDS 1990s -just relocated from [place] 08/2020 -no relative here; staying at [employer] -in the process of getting a job; he previously drive trucks in [place] -not in active sexual relationship at this time -smoking; no etoh; no street drugs  Review of Systems: ROS Negative other ROS      PMH: htn hiv  Social History   Tobacco Use  . Smoking status: Current Every Day Smoker    Types: Cigarettes, Cigars  . Smokeless tobacco: Never Used  . Tobacco comment: cigars and black and milds 2-3 a week   Substance Use Topics  . Alcohol use: Not Currently  . Drug use: Never    Fam hx: No cancer, dm2, rheumatologic disease  OBJECTIVE: Vitals:   12/10/20 1104  BP: 128/88  Pulse: 83  Temp: 97.6 F (36.4 C)  TempSrc: Oral  SpO2: 99%  Weight: 163 lb (73.9 kg)  Height: 5\' 10"  (1.778 m)   Body mass index is 23.39 kg/m.   Exam: General/constitutional: no distress, pleasant HEENT: Normocephalic, PER, Conj Clear, EOMI, Oropharynx clear Neck supple CV: rrr no mrg Lungs: clear to auscultation, normal respiratory effort Abd: Soft, Nontender Ext: no edema Skin: No Rash Neuro: nonfocal MSK: no peripheral joint swelling/tenderness/warmth; back spines nontender   Lab: Lab Results  Component Value Date   WBC 5.2 09/25/2020   HGB 14.5 09/25/2020   HCT 43.1 09/25/2020   MCV 85.2 09/25/2020   PLT  170 09/25/2020     Chemistry      Component Value Date/Time   NA 141 09/25/2020 0950   K 4.2 09/25/2020 0950   CL 107 09/25/2020 0950   CO2 28 09/25/2020 0950   BUN 11 09/25/2020 0950   CREATININE 0.81 09/25/2020 0950      Component Value Date/Time   CALCIUM 9.6 09/25/2020 0950   AST 30 09/25/2020 0950   ALT 55 (H) 09/25/2020 0950   BILITOT 0.5 09/25/2020 0950      Lab Results  Component Value Date   CHOL 210 (H) 09/25/2020   HDL 55 09/25/2020   LDLCALC 129 (H) 09/25/2020   TRIG 144 09/25/2020   CHOLHDL 3.8 09/25/2020    hiv          vl      /     cd4 (%) 09/25/20   UD    /     283 (18) 08/220    166 2020-2021 UD with blips here and there  Microbiology:  Serology: 09/25/2020 Quant gold Positive Urine gc/chlam negative rpr nonreactive Hep c Ab, hep b sAg/sAb/cAb nonreactive Hep a Ab reactive  Imaging:   Assessment/plan: Problem List Items Addressed This Visit   None    #hiv current therapy prezcobix, descovy; discuss switching to biktarvy. doenst appear prior VF or need to switch regimen due to that. 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  Paul Band, MD [facility] [phone] pager   [phone] cell 12/10/2020, 11:32 AM

## 2020-12-10 NOTE — Addendum Note (Signed)
Addended by: Linna Hoff D on: 12/10/2020 11:57 AM   Modules accepted: Orders

## 2020-12-15 ENCOUNTER — Other Ambulatory Visit (HOSPITAL_COMMUNITY): Payer: Self-pay

## 2020-12-16 ENCOUNTER — Other Ambulatory Visit (HOSPITAL_COMMUNITY): Payer: Self-pay

## 2020-12-26 LAB — HIV-1 RNA ULTRAQUANT REFLEX TO GENTYP+
HIV 1 RNA Quant: NOT DETECTED copies/mL
HIV-1 RNA Quant, Log: NOT DETECTED Log copies/mL

## 2021-01-19 ENCOUNTER — Other Ambulatory Visit: Payer: Self-pay | Admitting: Critical Care Medicine

## 2021-01-19 ENCOUNTER — Other Ambulatory Visit (HOSPITAL_COMMUNITY): Payer: Self-pay

## 2021-01-19 MED ORDER — VALSARTAN-HYDROCHLOROTHIAZIDE 80-12.5 MG PO TABS
1.0000 | ORAL_TABLET | Freq: Every day | ORAL | 0 refills | Status: DC
  Filled 2021-01-19: qty 30, 30d supply, fill #0
  Filled 2021-02-17: qty 30, 30d supply, fill #1

## 2021-01-21 ENCOUNTER — Other Ambulatory Visit: Payer: Self-pay | Admitting: Family

## 2021-01-21 DIAGNOSIS — B20 Human immunodeficiency virus [HIV] disease: Secondary | ICD-10-CM

## 2021-02-09 ENCOUNTER — Other Ambulatory Visit: Payer: Self-pay

## 2021-02-09 ENCOUNTER — Ambulatory Visit: Payer: Medicaid Other

## 2021-02-09 ENCOUNTER — Ambulatory Visit: Payer: Self-pay

## 2021-02-11 ENCOUNTER — Encounter: Payer: Self-pay | Admitting: Internal Medicine

## 2021-02-18 ENCOUNTER — Other Ambulatory Visit (HOSPITAL_COMMUNITY): Payer: Self-pay

## 2021-02-23 ENCOUNTER — Ambulatory Visit: Payer: Medicaid Other | Admitting: Critical Care Medicine

## 2021-02-26 ENCOUNTER — Other Ambulatory Visit: Payer: Medicaid Other

## 2021-03-11 ENCOUNTER — Other Ambulatory Visit: Payer: Self-pay

## 2021-03-11 DIAGNOSIS — B20 Human immunodeficiency virus [HIV] disease: Secondary | ICD-10-CM

## 2021-03-12 LAB — T-HELPER CELL (CD4) - (RCID CLINIC ONLY)
CD4 % Helper T Cell: 20 % — ABNORMAL LOW (ref 33–65)
CD4 T Cell Abs: 429 /uL (ref 400–1790)

## 2021-03-13 ENCOUNTER — Encounter: Payer: Medicaid Other | Admitting: Internal Medicine

## 2021-03-13 LAB — CBC
HCT: 45.2 % (ref 38.5–50.0)
Hemoglobin: 14.8 g/dL (ref 13.2–17.1)
MCH: 27.8 pg (ref 27.0–33.0)
MCHC: 32.7 g/dL (ref 32.0–36.0)
MCV: 84.8 fL (ref 80.0–100.0)
MPV: 11.8 fL (ref 7.5–12.5)
Platelets: 155 10*3/uL (ref 140–400)
RBC: 5.33 10*6/uL (ref 4.20–5.80)
RDW: 14.7 % (ref 11.0–15.0)
WBC: 5.1 10*3/uL (ref 3.8–10.8)

## 2021-03-13 LAB — COMPREHENSIVE METABOLIC PANEL
AG Ratio: 1.7 (calc) (ref 1.0–2.5)
ALT: 17 U/L (ref 9–46)
AST: 19 U/L (ref 10–35)
Albumin: 4.5 g/dL (ref 3.6–5.1)
Alkaline phosphatase (APISO): 79 U/L (ref 35–144)
BUN: 20 mg/dL (ref 7–25)
CO2: 26 mmol/L (ref 20–32)
Calcium: 9.2 mg/dL (ref 8.6–10.3)
Chloride: 108 mmol/L (ref 98–110)
Creat: 1 mg/dL (ref 0.70–1.30)
Globulin: 2.6 g/dL (calc) (ref 1.9–3.7)
Glucose, Bld: 84 mg/dL (ref 65–99)
Potassium: 3.8 mmol/L (ref 3.5–5.3)
Sodium: 140 mmol/L (ref 135–146)
Total Bilirubin: 0.4 mg/dL (ref 0.2–1.2)
Total Protein: 7.1 g/dL (ref 6.1–8.1)

## 2021-03-13 LAB — RPR: RPR Ser Ql: NONREACTIVE

## 2021-03-13 LAB — HIV-1 RNA QUANT-NO REFLEX-BLD
HIV 1 RNA Quant: <20 Copies/mL — ABNORMAL HIGH
HIV-1 RNA Quant, Log: <1.3 Log cps/mL — ABNORMAL HIGH

## 2021-03-17 ENCOUNTER — Other Ambulatory Visit (HOSPITAL_COMMUNITY): Payer: Self-pay

## 2021-03-17 ENCOUNTER — Other Ambulatory Visit: Payer: Self-pay | Admitting: Critical Care Medicine

## 2021-03-17 MED ORDER — VALSARTAN-HYDROCHLOROTHIAZIDE 80-12.5 MG PO TABS
1.0000 | ORAL_TABLET | Freq: Every day | ORAL | 0 refills | Status: DC
  Filled 2021-03-17: qty 30, 30d supply, fill #0

## 2021-03-18 ENCOUNTER — Other Ambulatory Visit (HOSPITAL_COMMUNITY): Payer: Self-pay

## 2021-03-25 ENCOUNTER — Encounter: Payer: Self-pay | Admitting: Internal Medicine

## 2021-03-31 ENCOUNTER — Ambulatory Visit: Payer: Medicaid Other | Admitting: Critical Care Medicine

## 2021-04-06 ENCOUNTER — Ambulatory Visit: Payer: Self-pay | Attending: Critical Care Medicine | Admitting: Critical Care Medicine

## 2021-04-06 ENCOUNTER — Other Ambulatory Visit: Payer: Self-pay

## 2021-04-06 ENCOUNTER — Other Ambulatory Visit (HOSPITAL_COMMUNITY): Payer: Self-pay

## 2021-04-06 ENCOUNTER — Ambulatory Visit (HOSPITAL_COMMUNITY)
Admission: RE | Admit: 2021-04-06 | Discharge: 2021-04-06 | Disposition: A | Discharge status: Home / Self Care | Payer: Medicaid Other | Source: Ambulatory Visit | Attending: Critical Care Medicine | Admitting: Critical Care Medicine

## 2021-04-06 ENCOUNTER — Encounter: Payer: Self-pay | Admitting: Critical Care Medicine

## 2021-04-06 VITALS — BP 143/92 | HR 82 | Resp 16 | Wt 175.2 lb

## 2021-04-06 DIAGNOSIS — M545 Low back pain, unspecified: Secondary | ICD-10-CM | POA: Insufficient documentation

## 2021-04-06 DIAGNOSIS — Z21 Asymptomatic human immunodeficiency virus [HIV] infection status: Secondary | ICD-10-CM

## 2021-04-06 DIAGNOSIS — I10 Essential (primary) hypertension: Secondary | ICD-10-CM

## 2021-04-06 DIAGNOSIS — G4700 Insomnia, unspecified: Secondary | ICD-10-CM | POA: Insufficient documentation

## 2021-04-06 DIAGNOSIS — G8929 Other chronic pain: Secondary | ICD-10-CM | POA: Insufficient documentation

## 2021-04-06 DIAGNOSIS — F419 Anxiety disorder, unspecified: Secondary | ICD-10-CM | POA: Insufficient documentation

## 2021-04-06 DIAGNOSIS — F5101 Primary insomnia: Secondary | ICD-10-CM

## 2021-04-06 MED ORDER — MELOXICAM 15 MG PO TABS
15.0000 mg | ORAL_TABLET | Freq: Every day | ORAL | 0 refills | Status: DC
  Filled 2021-04-06: qty 30, 30d supply, fill #0
  Filled 2021-05-25: qty 10, 10d supply, fill #1

## 2021-04-06 MED ORDER — HYDROXYZINE PAMOATE 25 MG PO CAPS
25.0000 mg | ORAL_CAPSULE | Freq: Three times a day (TID) | ORAL | 0 refills | Status: DC | PRN
  Filled 2021-04-06: qty 60, 20d supply, fill #0

## 2021-04-06 MED ORDER — VALSARTAN-HYDROCHLOROTHIAZIDE 160-25 MG PO TABS
1.0000 | ORAL_TABLET | Freq: Every day | ORAL | 3 refills | Status: DC
  Filled 2021-04-06: qty 30, 30d supply, fill #0
  Filled 2021-05-08: qty 30, 30d supply, fill #1
  Filled 2021-06-16: qty 30, 30d supply, fill #2

## 2021-04-06 MED ORDER — CYCLOBENZAPRINE HCL 10 MG PO TABS
10.0000 mg | ORAL_TABLET | Freq: Three times a day (TID) | ORAL | 0 refills | Status: DC | PRN
  Filled 2021-04-06: qty 60, 20d supply, fill #0

## 2021-04-06 NOTE — Assessment & Plan Note (Signed)
Lumbar back pain with muscle spasticity Plan meloxicam 15 mg daily, as needed cyclobenzaprine, and will obtain x-rays of the lumbar spine

## 2021-04-06 NOTE — Assessment & Plan Note (Signed)
Plan trial of hydroxyzine 25 mg 3 times daily as needed

## 2021-04-06 NOTE — Assessment & Plan Note (Signed)
Hypertension not well controlled  Plan to increase valsartan HCT to 160/25 daily

## 2021-04-06 NOTE — Progress Notes (Signed)
Established Patient Office Visit  Subjective:  Patient ID: Paul Blake, male    DOB: 1966/05/07  Age: 55 y.o. MRN: [id number]  CC:  Chief Complaint  Patient presents with   Hypertension    HPI Paul Blake presents for primary care to follow-up.  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  He is this is caused increased pain in the lumbar area.  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  He is compliant with his HIV therapy.  He does agree to a flu vaccine.  Patient has no other complaints other than some insomnia and also mild anxiety.  He is requesting hydroxyzine which he is received in the past. On arrival blood pressure is elevated 143/92 despite being on valsartan HCT 80/12.5 daily Past Medical History:  Diagnosis Date   Hypertension    Tuberculosis     Past Surgical History:  Procedure Laterality Date   no past surgery       History reviewed. No pertinent family history.  Social History   Socioeconomic History   Marital status: Single    Spouse name: Not on file   Number of children: Not on file   Years of education: Not on file   Highest education level: Not on file  Occupational History   Not on file  Tobacco Use   Smoking status: Every Day    Types: Cigarettes, Cigars   Smokeless tobacco: Never   Tobacco comments:    cigars and black and milds 2-3 a week   Substance and Sexual Activity   Alcohol use: Not Currently   Drug use: Never   Sexual activity: Not Currently    Comment: declined condoms 12/10/20  Other Topics Concern   Not on file  Social History Narrative   Not on file   Social Determinants of Health   Financial Resource Strain: Not on file  Food Insecurity: Not on file  Transportation Needs: Not on file  Physical Activity: Not on file  Stress: Not on file  Social Connections: Not on file  Intimate Partner Violence: Not on file    Outpatient Medications Prior to Visit  Medication Sig Dispense  Refill   bictegravir-emtricitabine-tenofovir AF (BIKTARVY) 50-200-25 MG TABS tablet Take 1 tablet by mouth daily. 30 tablet 11   valsartan-hydrochlorothiazide (DIOVAN HCT) 80-12.5 MG tablet Take 1 tablet by mouth daily. 60 tablet 0   No facility-administered medications prior to visit.    No Known Allergies  ROS Review of Systems  Constitutional:  Negative for chills, diaphoresis and fever.  HENT:  Negative for congestion, ear discharge, ear pain, hearing loss, nosebleeds, sore throat and tinnitus.   Eyes:  Negative for photophobia and discharge.  Respiratory:  Negative for cough, shortness of breath, wheezing and stridor.        No excess mucus  Cardiovascular:  Negative for chest pain, palpitations and leg swelling.  Gastrointestinal:  Negative for abdominal pain, blood in stool, constipation, diarrhea, nausea and vomiting.  Endocrine: Negative for polydipsia.  Genitourinary:  Negative for dysuria, flank pain, frequency, hematuria and urgency.  Musculoskeletal:  Positive for back pain. Negative for myalgias and neck pain.  Skin:  Negative for rash.  Allergic/Immunologic: Negative for environmental allergies.  Neurological:  Negative for dizziness, tremors, seizures, weakness and headaches.  Hematological:  Does not bruise/bleed easily.  Psychiatric/Behavioral:  Positive for sleep disturbance. Negative for hallucinations and suicidal ideas. The patient is nervous/anxious.   All other systems  reviewed and are negative.    Objective:    Physical Exam Vitals reviewed.  Constitutional:      Appearance: Normal appearance. He is well-developed. He is not diaphoretic.  HENT:     Head: Normocephalic and atraumatic.     Nose: No nasal deformity, septal deviation, mucosal edema or rhinorrhea.     Right Sinus: No maxillary sinus tenderness or frontal sinus tenderness.     Left Sinus: No maxillary sinus tenderness or frontal sinus tenderness.     Mouth/Throat:     Pharynx: No  oropharyngeal exudate.  Eyes:     General: No scleral icterus.    Conjunctiva/sclera: Conjunctivae normal.     Pupils: Pupils are equal, round, and reactive to light.  Neck:     Thyroid: No thyromegaly.     Vascular: No carotid bruit or JVD.     Trachea: Trachea normal. No tracheal tenderness or tracheal deviation.  Cardiovascular:     Rate and Rhythm: Normal rate and regular rhythm.     Chest Wall: PMI is not displaced.     Pulses: Normal pulses. No decreased pulses.     Heart sounds: Normal heart sounds, S1 normal and S2 normal. Heart sounds not distant. No murmur heard. No systolic murmur is present.  No diastolic murmur is present.    No friction rub. No gallop. No S3 or S4 sounds.  Pulmonary:     Effort: No tachypnea, accessory muscle usage or respiratory distress.     Breath sounds: No stridor. No decreased breath sounds, wheezing, rhonchi or rales.  Chest:     Chest wall: No tenderness.  Abdominal:     General: Bowel sounds are normal. There is no distension.     Palpations: Abdomen is soft. Abdomen is not rigid.     Tenderness: There is no abdominal tenderness. There is no guarding or rebound.  Musculoskeletal:        General: Normal range of motion.     Cervical back: Normal range of motion and neck supple. No edema, erythema or rigidity. No muscular tenderness. Normal range of motion.     Comments: Bilateral lumbar muscle paraspinal spasticity, no deformity seen, tenderness in lower lumbar areas.  No focal deficits  Lymphadenopathy:     Head:     Right side of head: No submental or submandibular adenopathy.     Left side of head: No submental or submandibular adenopathy.     Cervical: No cervical adenopathy.  Skin:    General: Skin is warm and dry.     Coloration: Skin is not pale.     Findings: No rash.     Nails: There is no clubbing.  Neurological:     General: No focal deficit present.     Mental Status: He is alert and oriented to person, place, and time.  Mental status is at baseline.     Sensory: No sensory deficit.  Psychiatric:        Speech: Speech normal.        Behavior: Behavior normal.    BP (!) 143/92   Pulse 82   Resp 16   Wt 175 lb 3.2 oz (79.5 kg)   SpO2 97%   BMI 25.14 kg/m  Wt Readings from Last 3 Encounters:  04/06/21 175 lb 3.2 oz (79.5 kg)  12/10/20 163 lb (73.9 kg)  11/04/20 172 lb (78 kg)     Health Maintenance Due  Topic Date Due   Zoster Vaccines- Shingrix (1 of  2) Never done   COVID-19 Vaccine (4 - Booster for Moderna series) 11/28/2020   INFLUENZA VACCINE  Never done    There are no preventive care reminders to display for this patient.  No results found for: TSH Lab Results  Component Value Date   WBC 5.1 03/11/2021   HGB 14.8 03/11/2021   HCT 45.2 03/11/2021   MCV 84.8 03/11/2021   PLT 155 03/11/2021   Lab Results  Component Value Date   NA 140 03/11/2021   K 3.8 03/11/2021   CO2 26 03/11/2021   GLUCOSE 84 03/11/2021   BUN 20 03/11/2021   CREATININE 1.00 03/11/2021   BILITOT 0.4 03/11/2021   AST 19 03/11/2021   ALT 17 03/11/2021   PROT 7.1 03/11/2021   CALCIUM 9.2 03/11/2021   Lab Results  Component Value Date   CHOL 210 (H) 09/25/2020   Lab Results  Component Value Date   HDL 55 09/25/2020   Lab Results  Component Value Date   LDLCALC 129 (H) 09/25/2020   Lab Results  Component Value Date   TRIG 144 09/25/2020   Lab Results  Component Value Date   CHOLHDL 3.8 09/25/2020   No results found for: HGBA1C    Assessment & Plan:   Problem List Items Addressed This Visit       Cardiovascular and Mediastinum   HTN (hypertension) - Primary    Hypertension not well controlled  Plan to increase valsartan HCT to 160/25 daily      Relevant Medications   valsartan-hydrochlorothiazide (DIOVAN-HCT) 160-25 MG tablet     Other   HIV (human immunodeficiency virus infection) (HCC)    Continue B[facility]tarvy per [facility]      Lumbar back pain     Lumbar back pain with muscle spasticity Plan meloxicam 15 mg daily, as needed cyclobenzaprine, and will obtain x-rays of the lumbar spine      Relevant Medications   meloxicam (MOBIC) 15 MG tablet   cyclobenzaprine (FLEXERIL) 10 MG tablet   Other Relevant Orders   DG Lumbar Spine Complete   Insomnia    Plan trial of melatonin 10 mg daily      Anxiety    Plan trial of hydroxyzine 25 mg 3 times daily as needed      Relevant Medications   hydrOXYzine (VISTARIL) 25 MG capsule    Meds ordered this encounter  Medications   valsartan-hydrochlorothiazide (DIOVAN-HCT) 160-25 MG tablet    Sig: Take 1 tablet by mouth daily.    Dispense:  60 tablet    Refill:  3    [employer], new dose   meloxicam (MOBIC) 15 MG tablet    Sig: Take 1 tablet (15 mg total) by mouth daily.    Dispense:  40 tablet    Refill:  0    [employer] [facility]   cyclobenzaprine (FLEXERIL) 10 MG tablet    Sig: Take 1 tablet (10 mg total) by mouth 3 (three) times daily as needed for muscle spasms.    Dispense:  60 tablet    Refill:  0    [employer] [facility]   hydrOXYzine (VISTARIL) 25 MG capsule    Sig: Take 1 capsule (25 mg total) by mouth every 8 (eight) hours as needed for anxiety.    Dispense:  60 capsule    Refill:  0    [employer] [facility]   Patient did receive flu vaccine this visit Follow-up: Return in about 3 months (around 07/06/2021).  Asencion Noble, MD

## 2021-04-06 NOTE — Patient Instructions (Addendum)
Flu vaccine was given  Begin meloxicam 15 mg daily for back pain and use cyclobenzaprine 3 times daily as needed for back spasm these medications were sent to the [facility] outpatient pharmacy  X-rays of the lower back will be made, go to [facility] next-door in the ground-floor is the radiology department  Increase valsartan HCT to 160/25 1 daily and this was sent to the [facility] outpatient pharmacy, you can take 2 of your current valsartan medications daily until that bottle is empty the refill will be a 1 tablet daily which is a higher dose  Trial of hydroxyzine 25 mg take 3 times daily as needed for anxiety  Try 10 mg of melatonin an hour before trying to sleep as needed for insomnia  Return to see Dr. Delford Field 3 months

## 2021-04-06 NOTE — Assessment & Plan Note (Signed)
Continue Biktarvy per [facility]

## 2021-04-06 NOTE — Assessment & Plan Note (Signed)
Plan trial of melatonin 10 mg daily

## 2021-04-07 ENCOUNTER — Other Ambulatory Visit (HOSPITAL_COMMUNITY): Payer: Self-pay

## 2021-04-08 ENCOUNTER — Telehealth: Payer: Self-pay

## 2021-04-08 NOTE — Telephone Encounter (Signed)
Contacted pt to go over lab results pt didn't answer lvm   Sent a CRM and forward labs to NT to give pt labs when they call back   

## 2021-04-22 ENCOUNTER — Telehealth: Payer: Self-pay

## 2021-04-22 NOTE — Telephone Encounter (Signed)
Patient called, states [facility] told him there is an issue with his insurance and will not refill his Biktarvy. Patient states he only has ADAP, not insurance.   Sandie Ano, RN

## 2021-04-22 NOTE — Telephone Encounter (Signed)
Contacted [facility] who stated it's too early for him to refill his medication. Patient filled medication on 9/18 and cannot fill until 10/8. Left message for patient to return call.   Raylee Strehl Loyola Mast, RN

## 2021-04-22 NOTE — Telephone Encounter (Signed)
I called patient to relay information. Patient states he has checked with his wife at home and she has found the medication.  Melani Brisbane T Pricilla Loveless

## 2021-05-06 ENCOUNTER — Ambulatory Visit: Payer: Medicaid Other | Admitting: Internal Medicine

## 2021-05-08 ENCOUNTER — Other Ambulatory Visit (HOSPITAL_COMMUNITY): Payer: Self-pay

## 2021-05-08 ENCOUNTER — Other Ambulatory Visit: Payer: Self-pay | Admitting: Critical Care Medicine

## 2021-05-09 MED ORDER — HYDROXYZINE PAMOATE 25 MG PO CAPS
25.0000 mg | ORAL_CAPSULE | Freq: Three times a day (TID) | ORAL | 2 refills | Status: DC | PRN
  Filled 2021-05-09: qty 60, 20d supply, fill #0
  Filled 2021-05-25 – 2021-06-16 (×2): qty 60, 20d supply, fill #1
  Filled 2021-08-24: qty 60, 20d supply, fill #0

## 2021-05-09 NOTE — Telephone Encounter (Signed)
Requested Prescriptions  Pending Prescriptions Disp Refills  . hydrOXYzine (VISTARIL) 25 MG capsule 60 capsule 2    Sig: Take 1 capsule (25 mg total) by mouth every 8 (eight) hours as needed for anxiety.     Ear, Nose, and Throat:  Antihistamines Passed - 05/08/2021  3:28 PM      Passed - Valid encounter within last 12 months    Recent Outpatient Visits          1 month ago Primary hypertension   [facility] Storm Frisk, MD   6 months ago Primary hypertension   [facility] Storm Frisk, MD      Future Appointments            In 1 month Delford Field Charlcie Cradle, MD [facility]

## 2021-05-11 ENCOUNTER — Other Ambulatory Visit (HOSPITAL_COMMUNITY): Payer: Self-pay

## 2021-05-25 ENCOUNTER — Encounter: Payer: Self-pay | Admitting: Internal Medicine

## 2021-05-25 ENCOUNTER — Other Ambulatory Visit: Payer: Self-pay | Admitting: Critical Care Medicine

## 2021-05-25 ENCOUNTER — Other Ambulatory Visit: Payer: Self-pay

## 2021-05-25 ENCOUNTER — Ambulatory Visit (INDEPENDENT_AMBULATORY_CARE_PROVIDER_SITE_OTHER): Payer: Self-pay | Admitting: Internal Medicine

## 2021-05-25 ENCOUNTER — Other Ambulatory Visit (HOSPITAL_COMMUNITY): Payer: Self-pay

## 2021-05-25 VITALS — BP 131/79 | HR 96 | Temp 98.3°F | Wt 172.0 lb

## 2021-05-25 DIAGNOSIS — Z113 Encounter for screening for infections with a predominantly sexual mode of transmission: Secondary | ICD-10-CM

## 2021-05-25 DIAGNOSIS — Z1159 Encounter for screening for other viral diseases: Secondary | ICD-10-CM

## 2021-05-25 DIAGNOSIS — Z23 Encounter for immunization: Secondary | ICD-10-CM

## 2021-05-25 DIAGNOSIS — B20 Human immunodeficiency virus [HIV] disease: Secondary | ICD-10-CM

## 2021-05-25 DIAGNOSIS — Z1211 Encounter for screening for malignant neoplasm of colon: Secondary | ICD-10-CM

## 2021-05-25 DIAGNOSIS — R21 Rash and other nonspecific skin eruption: Secondary | ICD-10-CM

## 2021-05-25 NOTE — Progress Notes (Signed)
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 male here for ongoing hiv care  05/25/21 id f/u Social -- Psychologist, occupational. Lives with wife now; married 02/28/2021. Wife is HIV positive and controlled on medication Reviewed labs from 02/2021 when he missed his appointment HIV rna quant <20 and has been undetectable in our system since 09/2020 No missed dose biktarvy last 4 weeks ROS -- no fever, chill, weight loss, headache, cough, chest pain, abd pain/n/v/diarrhea, rash, focal weakness/numbness-tingling  Has 3-4 days right armpit region tender nodule. Just changed deodorant recently when it occurs. 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 He is frustrated waiting too long for the licence to come    He first saw me on 10/09/2020 in [facility] ----------------------------------------- background He is currently well controlled on prezcobix/descovy  #hiv -dx'ed 1991; got it from his wife; no ivdu -remembers taking bactrim for a long time; but doesn't remember any OI history -previous HIV provider [facility] Lenise Arena most recent prior to  going to [place]) -therapy Approximately 2016-Currently on prezcobix/descovy Doesn't recall switching ART regimen previously due to failure Previous therapy include kaletra, atazanavir, lamivudine, truvada, combivir, azt -still have medication from prison  #positive quantiferon gold testing/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 Took inh for 6-9 months 1980s -currently no cough, chest pain, sob -endorse some weight loss (185 lbs --> 169 lbs since prison) - he thinks stress/food related from prison -had good appetite before, but not as good since prison time, lots of stress now looking for place to stay/job -no fever/chill/nightsweat, rash -have chronic knees pain no new joint pain  #htn -taking hctz -continue to see pcp dr Delford Field   #social -he was recently incarcerated in [place] (2020-2021) prior to transfer here -his wife passed away from AIDS 1990s -just relocated from [place] 08/2020 -no relative here; staying at [employer] -in the process of getting a job; he previously drive trucks in [place] -not in active sexual relationship at this time -smoking; no etoh; no street drugs  Review of Systems: ROS Negative other ROS      PMH: htn hiv  Social History   Tobacco Use   Smoking status: Every Day    Types: Cigarettes, Cigars   Smokeless tobacco: Never   Tobacco comments:    cigars and black and milds 2-3 a week   Substance Use Topics   Alcohol use: Not Currently   Drug use: Never    Fam hx: No cancer, dm2, rheumatologic disease  OBJECTIVE: Vitals:   05/25/21 1621  Weight: 172 lb (78 kg)  Body mass index is 24.68 kg/m.   Exam: General/constitutional: no distress, pleasant HEENT: Normocephalic, PER, Conj Clear, EOMI, Oropharynx clear Neck supple CV: rrr no mrg Lungs: clear to auscultation, normal respiratory effort Abd: Soft, Nontender Ext: no edema Skin: slightly tender 2 cm hard nodule right axillary area that is mobile Neuro:  nonfocal MSK: no peripheral joint swelling/tenderness/warmth; back spines nontender    Lab: Lab Results  Component Value Date   WBC 5.1 03/11/2021   HGB 14.8 03/11/2021   HCT 45.2 03/11/2021   MCV 84.8 03/11/2021   PLT 155 03/11/2021     Chemistry      Component Value Date/Time   NA 140 03/11/2021 1622   K 3.8 03/11/2021 1622   CL 108 03/11/2021 1622   CO2 26 03/11/2021 1622   BUN 20 03/11/2021 1622   CREATININE 1.00 03/11/2021 1622      Component Value Date/Time   CALCIUM 9.2 03/11/2021 1622   AST 19 03/11/2021 1622   ALT 17 03/11/2021 1622   BILITOT 0.4 03/11/2021 1622     Lab Results  Component Value Date   CHOL 210 (H) 09/25/2020   HDL 55 09/25/2020   LDLCALC 129 (H) 09/25/2020   TRIG 144 09/25/2020   CHOLHDL 3.8 09/25/2020    hiv          vl      /     cd4 (%) 02/2021    UD 09/25/20   UD    /     283 (18) 08/220    166 2020-2021 UD with blips here and there  Microbiology:  Serology: 02/2021 rpr negative  09/25/2020 Quant gold Positive Urine gc/chlam negative rpr nonreactive Hep c Ab, hep b sAg/sAb/cAb nonreactive Hep a Ab reactive  Imaging:   Assessment/plan: Problem List Items Addressed This Visit   None Visit Diagnoses     HIV disease (HCC)    -  Primary   Relevant Orders   T-helper cell (CD4)- ([facility] clinic only)   HIV-1 RNA quant-no reflex-bld   CBC   Comprehensive metabolic panel   Skin rash       Need for hepatitis B screening test       Relevant Orders   Hepatitis B surface antibody,quantitative   Screening for STDs (sexually transmitted diseases)       Relevant Orders   RPR   Urine cytology ancillary only   Cytology (oral, anal, urethral) ancillary only   Cytology (oral, anal, urethral) ancillary only       #hiv Juanell Fairly patient current therapy prezcobix, descovy; discuss switching to biktarvy which we after 09/2020 visit. doenst appear prior VF or need to switch regimen due to that. No know genotype, no prior integrase  strand inhibitor use  Patient compliant with biktarvy and has been doing well on that   -continue biktarvy -discussed u=u -encourage ongoing compliance -labs can be done 6 months from 02/2021 in 08/2021 -approved for RW 09/2020   #htn #cad risk Patient has a PCP. Will defer care in this regard -continue hctz/asa   #hx latent tb Treated 1980s No sx  #right axillary nodule Suspect a granuloma due to ingrown hair. No clinical suggestion of infection. Duration of 3-4 days only unlikely malignancy. Has had same that resolved about 1-2 weeks  -if continues to enlarge/painful, patient advised to contact our clinic in 2-3 weeks so we can consider biopsy   #hcm -immunization covid vaccine x2 by 11/2019 tdap 11/2020 Pneumovax 04/2017 prevnar to give  today 10/09/2020 Meningococcal  -hepatitis Previous hepatitis B vaccine 2018; 09/25/2020 hep B sAb negative; repeat hep b vaccine started 10/09/2020 with heplasav, and second shot today 05/25/2021 Hep A vaccine 2019 -cancer screening Referal to colonoscopy placed 05/25/21   Follow-up: Return in about 4 months (around 09/22/2021).  [name], MD [facility] [phone] pager   [phone] cell 05/25/2021, 4:22 PM

## 2021-05-25 NOTE — Addendum Note (Signed)
Addended by: Marcell Anger on: 05/25/2021 04:53 PM   Modules accepted: Orders

## 2021-05-25 NOTE — Patient Instructions (Signed)
Will finish your hepatitis b vaccine series today  You can see [place] in 08/2021 and do labs either same day of 5 days at least prior to visit (labs ordered)  Continue your biktarvy

## 2021-05-25 NOTE — Telephone Encounter (Signed)
Requested medications are due for refill today.  yes  Requested medications are on the active medications list.  yes  Last refill. 04/06/2021  Future visit scheduled.   yes  Notes to clinic.  Medication not delegated. 

## 2021-05-25 NOTE — Telephone Encounter (Signed)
Pt came into clinic requesting med refill for muscle relaxer and inflammation meds. Advised will take 24-48 hrs to see if Dr. Delford Field will approve

## 2021-05-26 ENCOUNTER — Other Ambulatory Visit (HOSPITAL_COMMUNITY): Payer: Self-pay

## 2021-05-26 ENCOUNTER — Telehealth: Payer: Self-pay | Admitting: Critical Care Medicine

## 2021-05-26 MED ORDER — MELOXICAM 15 MG PO TABS
15.0000 mg | ORAL_TABLET | Freq: Every day | ORAL | 0 refills | Status: DC
  Filled 2021-05-26: qty 40, 40d supply, fill #0
  Filled 2021-06-16: qty 30, 30d supply, fill #0

## 2021-05-26 MED ORDER — CYCLOBENZAPRINE HCL 10 MG PO TABS
10.0000 mg | ORAL_TABLET | Freq: Three times a day (TID) | ORAL | 0 refills | Status: DC | PRN
  Filled 2021-05-26: qty 60, 20d supply, fill #0

## 2021-05-26 MED ORDER — CYCLOBENZAPRINE HCL 10 MG PO TABS
10.0000 mg | ORAL_TABLET | Freq: Three times a day (TID) | ORAL | 1 refills | Status: DC | PRN
  Filled 2021-05-26 – 2021-05-27 (×2): qty 60, 20d supply, fill #0

## 2021-05-26 NOTE — Telephone Encounter (Signed)
         Show:Clear all [x] Written[] Templated[] Copied  Added by: [x] S  [] Hover for details       Pt came into clinic requesting med refill for muscle relaxer and inflammation meds. Advised will take 24-48 hrs to see if Dr. will approve

## 2021-05-26 NOTE — Telephone Encounter (Signed)
Pt was contacted and voicemail was left

## 2021-05-26 NOTE — Telephone Encounter (Signed)
Call pt and tell him meds sent to [facility] outpatient pharmacy under the Nurse patient assistance fund

## 2021-05-27 ENCOUNTER — Other Ambulatory Visit (HOSPITAL_COMMUNITY): Payer: Self-pay

## 2021-06-05 ENCOUNTER — Other Ambulatory Visit (HOSPITAL_COMMUNITY): Payer: Self-pay

## 2021-06-16 ENCOUNTER — Other Ambulatory Visit (HOSPITAL_COMMUNITY): Payer: Self-pay

## 2021-07-05 NOTE — Progress Notes (Signed)
Established Patient Office Visit  Subjective:  Patient ID: Paul Blake, male    DOB: 07/04/1966  Age: 55 y.o. MRN: [id number]  CC:  Chief Complaint  Patient presents with   Hypertension    HPI Paul SaleShawn Meschke presents for primary care follow-up visit.  Patient history of HIV and hypertension.  On arrival blood pressures 110/75.  Patient maintains valsartan HCT 160/25.  Patient also is on the omeprazole daily and Biktarvy.  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  Patient does have housing at this time he lives with his fiance.  Note we had x-rayed his lumbar previously was normal.   Past Medical History:  Diagnosis Date   Hypertension    Tuberculosis     Past Surgical History:  Procedure Laterality Date   no past surgery       History reviewed. No pertinent family history.  Social History   Socioeconomic History   Marital status: Single    Spouse name: Not on file   Number of children: Not on file   Years of education: Not on file   Highest education level: Not on file  Occupational History   Not on file  Tobacco Use   Smoking status: Every Day    Types: Cigarettes, Cigars   Smokeless tobacco: Never   Tobacco comments:    cigars and black and milds 2-3 a week   Substance and Sexual Activity   Alcohol use: Not Currently   Drug use: Never   Sexual activity: Not Currently    Comment: declined condoms 12/10/20  Other Topics Concern   Not on file  Social History Narrative   Not on file   Social Determinants of Health   Financial Resource Strain: Not on file  Food Insecurity: Not on file  Transportation Needs: Not on file  Physical Activity: Not on file  Stress: Not on file  Social Connections: Not on file  Intimate Partner Violence: Not on file    Outpatient Medications Prior to Visit  Medication Sig Dispense Refill   bictegravir-emtricitabine-tenofovir AF  (BIKTARVY) 50-200-25 MG TABS tablet Take 1 tablet by mouth daily. 30 tablet 11   hydrOXYzine (VISTARIL) 25 MG capsule Take 1 capsule (25 mg total) by mouth every 8 (eight) hours as needed for anxiety. 60 capsule 2   cyclobenzaprine (FLEXERIL) 10 MG tablet Take 1 tablet (10 mg total) by mouth 3 (three) times daily as needed for muscle spasms. 60 tablet 1   meloxicam (MOBIC) 15 MG tablet Take 1 tablet (15 mg total) by mouth daily. 40 tablet 0   valsartan-hydrochlorothiazide (DIOVAN-HCT) 160-25 MG tablet Take 1 tablet by mouth daily. 60 tablet 3   No facility-administered medications prior to visit.    No Known Allergies  ROS Review of Systems  Constitutional:  Negative for chills, diaphoresis and fever.  HENT:  Negative for congestion, hearing loss, nosebleeds, sore throat and tinnitus.   Eyes:  Negative for photophobia and redness.  Respiratory:  Negative for cough, shortness of breath, wheezing and stridor.   Cardiovascular:  Negative for chest pain, palpitations and leg swelling.  Gastrointestinal:  Negative for abdominal pain, blood in stool, constipation, diarrhea, nausea and vomiting.  Endocrine: Negative for polydipsia.  Genitourinary:  Negative for dysuria, flank pain, frequency, hematuria and urgency.  Musculoskeletal:  Positive for back pain. Negative for myalgias and neck pain.  Skin:  Negative for rash.  Allergic/Immunologic: Negative for environmental allergies.  Neurological:  Negative for dizziness, tremors, seizures, weakness and headaches.  Hematological:  Does not bruise/bleed easily.  Psychiatric/Behavioral:  Negative for suicidal ideas. The patient is not nervous/anxious.      Objective:    Physical Exam Vitals reviewed.  Constitutional:      Appearance: Normal appearance. He is well-developed. He is not diaphoretic.  HENT:     Head: Normocephalic and atraumatic.     Nose: No nasal deformity, septal deviation, mucosal edema or rhinorrhea.     Right Sinus: No  maxillary sinus tenderness or frontal sinus tenderness.     Left Sinus: No maxillary sinus tenderness or frontal sinus tenderness.     Mouth/Throat:     Pharynx: No oropharyngeal exudate.  Eyes:     General: No scleral icterus.    Conjunctiva/sclera: Conjunctivae normal.     Pupils: Pupils are equal, round, and reactive to light.  Neck:     Thyroid: No thyromegaly.     Vascular: No carotid bruit or JVD.     Trachea: Trachea normal. No tracheal tenderness or tracheal deviation.  Cardiovascular:     Rate and Rhythm: Normal rate and regular rhythm.     Chest Wall: PMI is not displaced.     Pulses: Normal pulses. No decreased pulses.     Heart sounds: Normal heart sounds, S1 normal and S2 normal. Heart sounds not distant. No murmur heard. No systolic murmur is present.  No diastolic murmur is present.    No friction rub. No gallop. No S3 or S4 sounds.  Pulmonary:     Effort: No tachypnea, accessory muscle usage or respiratory distress.     Breath sounds: No stridor. No decreased breath sounds, wheezing, rhonchi or rales.  Chest:     Chest wall: No tenderness.  Abdominal:     General: Bowel sounds are normal. There is no distension.     Palpations: Abdomen is soft. Abdomen is not rigid.     Tenderness: There is no abdominal tenderness. There is no guarding or rebound.  Musculoskeletal:        General: Tenderness present. Normal range of motion.     Cervical back: Normal range of motion and neck supple. No edema, erythema or rigidity. No muscular tenderness. Normal range of motion.     Comments: Bilateral upper trapezius muscle spasticity  Lymphadenopathy:     Head:     Right side of head: No submental or submandibular adenopathy.     Left side of head: No submental or submandibular adenopathy.     Cervical: No cervical adenopathy.  Skin:    General: Skin is warm and dry.     Coloration: Skin is not pale.     Findings: No rash.     Nails: There is no clubbing.  Neurological:      Mental Status: He is alert and oriented to person, place, and time.     Sensory: No sensory deficit.  Psychiatric:        Mood and Affect: Mood normal.        Speech: Speech normal.        Behavior: Behavior normal.        Thought Content: Thought content normal.        Judgment: Judgment normal.    BP 110/75   Pulse (!) 101   Resp 16   Wt 163 lb 9.6 oz (74.2 kg)   SpO2 96%   BMI 23.47 kg/m  Wt  Readings from Last 3 Encounters:  07/06/21 163 lb 9.6 oz (74.2 kg)  05/25/21 172 lb (78 kg)  04/06/21 175 lb 3.2 oz (79.5 kg)     Health Maintenance Due  Topic Date Due   Zoster Vaccines- Shingrix (1 of 2) Never done   COVID-19 Vaccine (4 - Booster for Moderna series) 10/31/2020    There are no preventive care reminders to display for this patient.  No results found for: TSH Lab Results  Component Value Date   WBC 5.1 03/11/2021   HGB 14.8 03/11/2021   HCT 45.2 03/11/2021   MCV 84.8 03/11/2021   PLT 155 03/11/2021   Lab Results  Component Value Date   NA 140 03/11/2021   K 3.8 03/11/2021   CO2 26 03/11/2021   GLUCOSE 84 03/11/2021   BUN 20 03/11/2021   CREATININE 1.00 03/11/2021   BILITOT 0.4 03/11/2021   AST 19 03/11/2021   ALT 17 03/11/2021   PROT 7.1 03/11/2021   CALCIUM 9.2 03/11/2021   Lab Results  Component Value Date   CHOL 210 (H) 09/25/2020   Lab Results  Component Value Date   HDL 55 09/25/2020   Lab Results  Component Value Date   LDLCALC 129 (H) 09/25/2020   Lab Results  Component Value Date   TRIG 144 09/25/2020   Lab Results  Component Value Date   CHOLHDL 3.8 09/25/2020   No results found for: HGBA1C    Assessment & Plan:   Problem List Items Addressed This Visit       Cardiovascular and Mediastinum   HTN (hypertension) - Primary    Hypertension well controlled  Continue valsartan HCT      Relevant Medications   valsartan-hydrochlorothiazide (DIOVAN-HCT) 160-25 MG tablet     Other   HIV (human immunodeficiency virus  infection) ([place] [facility])    Treatment per infectious disease      Lumbar back pain    Back pain has migrated more into the trapezius muscles upper back  I gave the patient topical Voltaren  gel and also back exercises      Tobacco use       Current smoking consumption amount: 4 cigarettes a day  Dicsussion on advise to quit smoking and smoking impacts: Cardiovascular impacts  Patient's willingness to quit: Not ready to quit  Methods to quit smoking discussed: Behavioral modification nicotine replacement  Medication management of smoking session drugs discussed: Nicotine replacement  Resources provided:  AVS   Setting quit date not established  Follow-up arranged 4 months   Time spent counseling the patient: 3 minutes       Other Visit Diagnoses     Need for immunization against influenza       Relevant Orders   Flu Vaccine QUAD 24mo+IM (Fluarix, Fluzone & Alfiuria Quad PF) (Completed)       Meds ordered this encounter  Medications   omeprazole (PRILOSEC) 20 MG capsule    Sig: Take 1 capsule (20 mg total) by mouth daily before breakfast.    Dispense:  60 capsule    Refill:  3   valsartan-hydrochlorothiazide (DIOVAN-HCT) 160-25 MG tablet    Sig: Take 1 tablet by mouth daily.    Dispense:  60 tablet    Refill:  3   diclofenac Sodium (VOLTAREN) 1 % GEL    Sig: Apply 2 g topically in the morning, at noon, and at bedtime.    Dispense:  50 g    Refill:  3  Follow-up: Return in about 4 months (around 11/04/2021).    Asencion Noble, MD

## 2021-07-06 ENCOUNTER — Encounter: Payer: Self-pay | Admitting: Critical Care Medicine

## 2021-07-06 ENCOUNTER — Ambulatory Visit: Payer: Self-pay | Attending: Critical Care Medicine | Admitting: Critical Care Medicine

## 2021-07-06 ENCOUNTER — Other Ambulatory Visit: Payer: Self-pay

## 2021-07-06 VITALS — BP 110/75 | HR 101 | Resp 16 | Wt 163.6 lb

## 2021-07-06 DIAGNOSIS — F17210 Nicotine dependence, cigarettes, uncomplicated: Secondary | ICD-10-CM | POA: Insufficient documentation

## 2021-07-06 DIAGNOSIS — Z7901 Long term (current) use of anticoagulants: Secondary | ICD-10-CM | POA: Insufficient documentation

## 2021-07-06 DIAGNOSIS — Z23 Encounter for immunization: Secondary | ICD-10-CM | POA: Insufficient documentation

## 2021-07-06 DIAGNOSIS — M545 Low back pain, unspecified: Secondary | ICD-10-CM

## 2021-07-06 DIAGNOSIS — Z21 Asymptomatic human immunodeficiency virus [HIV] infection status: Secondary | ICD-10-CM | POA: Insufficient documentation

## 2021-07-06 DIAGNOSIS — M546 Pain in thoracic spine: Secondary | ICD-10-CM | POA: Insufficient documentation

## 2021-07-06 DIAGNOSIS — Z720 Tobacco use: Secondary | ICD-10-CM | POA: Insufficient documentation

## 2021-07-06 DIAGNOSIS — I10 Essential (primary) hypertension: Secondary | ICD-10-CM | POA: Insufficient documentation

## 2021-07-06 MED ORDER — DICLOFENAC SODIUM 1 % EX GEL
2.0000 g | Freq: Three times a day (TID) | CUTANEOUS | 3 refills | Status: DC
  Filled 2021-07-06: qty 100, 16d supply, fill #0
  Filled 2021-07-10: qty 100, 12d supply, fill #0

## 2021-07-06 MED ORDER — OMEPRAZOLE 20 MG PO CPDR
20.0000 mg | DELAYED_RELEASE_CAPSULE | Freq: Every day | ORAL | 3 refills | Status: DC
  Filled 2021-07-06: qty 30, 30d supply, fill #0

## 2021-07-06 MED ORDER — VALSARTAN-HYDROCHLOROTHIAZIDE 160-25 MG PO TABS
1.0000 | ORAL_TABLET | Freq: Every day | ORAL | 3 refills | Status: DC
  Filled 2021-07-06: qty 60, 60d supply, fill #0
  Filled 2021-07-12 – 2021-07-21 (×6): qty 30, 30d supply, fill #0
  Filled 2021-07-21: qty 60, 60d supply, fill #0
  Filled 2021-08-20 – 2021-08-24 (×3): qty 30, 30d supply, fill #0

## 2021-07-06 NOTE — Assessment & Plan Note (Signed)
Treatment per infectious disease

## 2021-07-06 NOTE — Assessment & Plan Note (Signed)
    Current smoking consumption amount: 4 cigarettes a day  Dicsussion on advise to quit smoking and smoking impacts: Cardiovascular impacts  Patient's willingness to quit: Not ready to quit  Methods to quit smoking discussed: Behavioral modification nicotine replacement  Medication management of smoking session drugs discussed: Nicotine replacement  Resources provided:  AVS   Setting quit date not established  Follow-up arranged 4 months   Time spent counseling the patient: 3 minutes  

## 2021-07-06 NOTE — Assessment & Plan Note (Signed)
Hypertension well controlled  Continue valsartan HCT

## 2021-07-06 NOTE — Assessment & Plan Note (Signed)
Back pain has migrated more into the trapezius muscles upper back  I gave the patient topical Voltaren  gel and also back exercises

## 2021-07-06 NOTE — Patient Instructions (Signed)
Take omeprazole one daily before breakfast for acid reflux  Take voltaren gel apply to upper back 3 times a day for back pain and do attached back exercises  Stay on valsartan hct for blood pressure  Flu vaccine was given  Return Dr Delford Field 4 months

## 2021-07-07 ENCOUNTER — Other Ambulatory Visit: Payer: Self-pay

## 2021-07-10 ENCOUNTER — Other Ambulatory Visit: Payer: Self-pay

## 2021-07-10 ENCOUNTER — Other Ambulatory Visit (HOSPITAL_COMMUNITY): Payer: Self-pay

## 2021-07-13 ENCOUNTER — Other Ambulatory Visit: Payer: Self-pay | Admitting: Critical Care Medicine

## 2021-07-13 ENCOUNTER — Other Ambulatory Visit (HOSPITAL_COMMUNITY): Payer: Self-pay

## 2021-07-13 ENCOUNTER — Other Ambulatory Visit: Payer: Self-pay

## 2021-07-13 MED ORDER — MELOXICAM 15 MG PO TABS
15.0000 mg | ORAL_TABLET | Freq: Every day | ORAL | 0 refills | Status: DC
  Filled 2021-07-13: qty 30, 30d supply, fill #0
  Filled 2021-08-24: qty 10, 10d supply, fill #0

## 2021-07-14 ENCOUNTER — Other Ambulatory Visit: Payer: Self-pay

## 2021-07-14 ENCOUNTER — Other Ambulatory Visit (HOSPITAL_COMMUNITY): Payer: Self-pay

## 2021-07-15 ENCOUNTER — Other Ambulatory Visit (HOSPITAL_COMMUNITY): Payer: Self-pay

## 2021-07-15 ENCOUNTER — Other Ambulatory Visit: Payer: Self-pay

## 2021-07-16 ENCOUNTER — Other Ambulatory Visit: Payer: Self-pay

## 2021-07-17 ENCOUNTER — Other Ambulatory Visit (HOSPITAL_COMMUNITY): Payer: Self-pay

## 2021-07-21 ENCOUNTER — Other Ambulatory Visit (HOSPITAL_COMMUNITY): Payer: Self-pay

## 2021-07-21 ENCOUNTER — Other Ambulatory Visit: Payer: Self-pay

## 2021-07-22 ENCOUNTER — Other Ambulatory Visit: Payer: Self-pay

## 2021-07-27 ENCOUNTER — Other Ambulatory Visit: Payer: Self-pay

## 2021-08-17 ENCOUNTER — Other Ambulatory Visit: Payer: Self-pay

## 2021-08-17 ENCOUNTER — Ambulatory Visit (HOSPITAL_COMMUNITY)
Admission: EM | Admit: 2021-08-17 | Discharge: 2021-08-17 | Disposition: A | Discharge status: Other Acute Inpatient Hospital | Payer: Medicaid Other | Attending: Family Medicine | Admitting: Family Medicine

## 2021-08-17 ENCOUNTER — Encounter (HOSPITAL_COMMUNITY): Payer: Self-pay | Admitting: *Deleted

## 2021-08-17 ENCOUNTER — Emergency Department (HOSPITAL_COMMUNITY): Payer: Self-pay

## 2021-08-17 ENCOUNTER — Emergency Department (HOSPITAL_COMMUNITY)
Admission: EM | Admit: 2021-08-17 | Discharge: 2021-08-18 | Disposition: A | Discharge status: Home / Self Care | Payer: Self-pay | Attending: Emergency Medicine | Admitting: Emergency Medicine

## 2021-08-17 DIAGNOSIS — R519 Headache, unspecified: Secondary | ICD-10-CM | POA: Insufficient documentation

## 2021-08-17 DIAGNOSIS — G43909 Migraine, unspecified, not intractable, without status migrainosus: Secondary | ICD-10-CM

## 2021-08-17 DIAGNOSIS — R791 Abnormal coagulation profile: Secondary | ICD-10-CM | POA: Insufficient documentation

## 2021-08-17 DIAGNOSIS — R531 Weakness: Secondary | ICD-10-CM | POA: Insufficient documentation

## 2021-08-17 DIAGNOSIS — R42 Dizziness and giddiness: Secondary | ICD-10-CM

## 2021-08-17 DIAGNOSIS — I10 Essential (primary) hypertension: Secondary | ICD-10-CM | POA: Insufficient documentation

## 2021-08-17 DIAGNOSIS — R29818 Other symptoms and signs involving the nervous system: Secondary | ICD-10-CM | POA: Insufficient documentation

## 2021-08-17 DIAGNOSIS — F17290 Nicotine dependence, other tobacco product, uncomplicated: Secondary | ICD-10-CM | POA: Insufficient documentation

## 2021-08-17 LAB — CBC
HCT: 44.4 % (ref 39.0–52.0)
Hemoglobin: 14.9 g/dL (ref 13.0–17.0)
MCH: 29.3 pg (ref 26.0–34.0)
MCHC: 33.6 g/dL (ref 30.0–36.0)
MCV: 87.2 fL (ref 80.0–100.0)
Platelets: 183 10*3/uL (ref 150–400)
RBC: 5.09 MIL/uL (ref 4.22–5.81)
RDW: 15 % (ref 11.5–15.5)
WBC: 5.5 10*3/uL (ref 4.0–10.5)
nRBC: 0 % (ref 0.0–0.2)

## 2021-08-17 LAB — I-STAT CHEM 8, ED
BUN: 19 mg/dL (ref 6–20)
Calcium, Ion: 1.16 mmol/L (ref 1.15–1.40)
Chloride: 103 mmol/L (ref 98–111)
Creatinine, Ser: 1 mg/dL (ref 0.61–1.24)
Glucose, Bld: 93 mg/dL (ref 70–99)
HCT: 46 % (ref 39.0–52.0)
Hemoglobin: 15.6 g/dL (ref 13.0–17.0)
Potassium: 3.1 mmol/L — ABNORMAL LOW (ref 3.5–5.1)
Sodium: 140 mmol/L (ref 135–145)
TCO2: 26 mmol/L (ref 22–32)

## 2021-08-17 LAB — CBG MONITORING, ED: Glucose-Capillary: 110 mg/dL — ABNORMAL HIGH (ref 70–99)

## 2021-08-17 LAB — COMPREHENSIVE METABOLIC PANEL
ALT: 15 U/L (ref 0–44)
AST: 18 U/L (ref 15–41)
Albumin: 4.1 g/dL (ref 3.5–5.0)
Alkaline Phosphatase: 62 U/L (ref 38–126)
Anion gap: 9 (ref 5–15)
BUN: 18 mg/dL (ref 6–20)
CO2: 22 mmol/L (ref 22–32)
Calcium: 8.7 mg/dL — ABNORMAL LOW (ref 8.9–10.3)
Chloride: 106 mmol/L (ref 98–111)
Creatinine, Ser: 1.08 mg/dL (ref 0.61–1.24)
GFR, Estimated: >60 mL/min (ref >60)
Glucose, Bld: 91 mg/dL (ref 70–99)
Potassium: 3.2 mmol/L — ABNORMAL LOW (ref 3.5–5.1)
Sodium: 137 mmol/L (ref 135–145)
Total Bilirubin: 0.6 mg/dL (ref 0.3–1.2)
Total Protein: 6.9 g/dL (ref 6.5–8.1)

## 2021-08-17 LAB — DIFFERENTIAL
Abs Immature Granulocytes: 0.01 10*3/uL (ref 0.00–0.07)
Basophils Absolute: 0 10*3/uL (ref 0.0–0.1)
Basophils Relative: 1 %
Eosinophils Absolute: 0.2 10*3/uL (ref 0.0–0.5)
Eosinophils Relative: 3 %
Immature Granulocytes: 0 %
Lymphocytes Relative: 39 %
Lymphs Abs: 2.2 10*3/uL (ref 0.7–4.0)
Monocytes Absolute: 0.6 10*3/uL (ref 0.1–1.0)
Monocytes Relative: 11 %
Neutro Abs: 2.5 10*3/uL (ref 1.7–7.7)
Neutrophils Relative %: 46 %

## 2021-08-17 LAB — PROTIME-INR
INR: 1 (ref 0.8–1.2)
Prothrombin Time: 12.8 seconds (ref 11.4–15.2)

## 2021-08-17 LAB — APTT: aPTT: 30 seconds (ref 24–36)

## 2021-08-17 IMAGING — CT CT HEAD W/O CM
3 series · 16 of 47 positions shown, 19 images · non-contrast
Comparison: None.

CLINICAL DATA: Neuro deficit, acute, stroke suspected.  Dizziness.



[Series 3: head 5.0 h30s · axial · 0.44mm/px · z∈[-107,+33]mm · 10 of 34 slices shown, 13 images]
[im 3/34  brain]
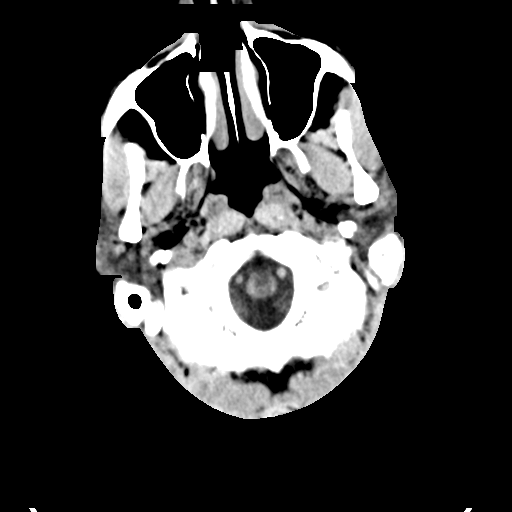
[im 3/34  bone]
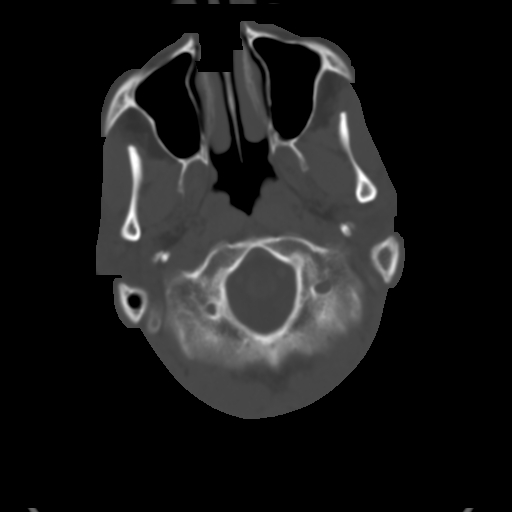
[im 6/34  brain]
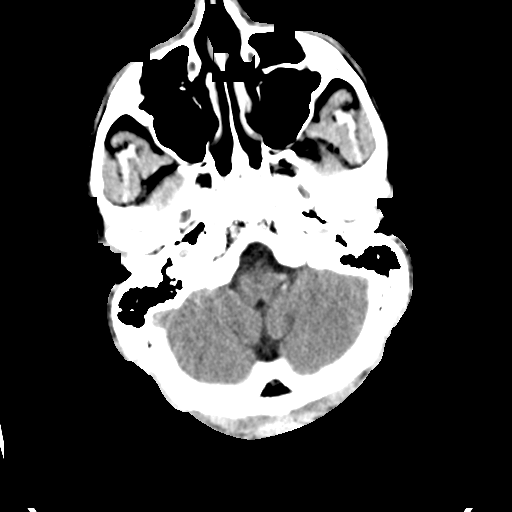
[im 10/34  brain]
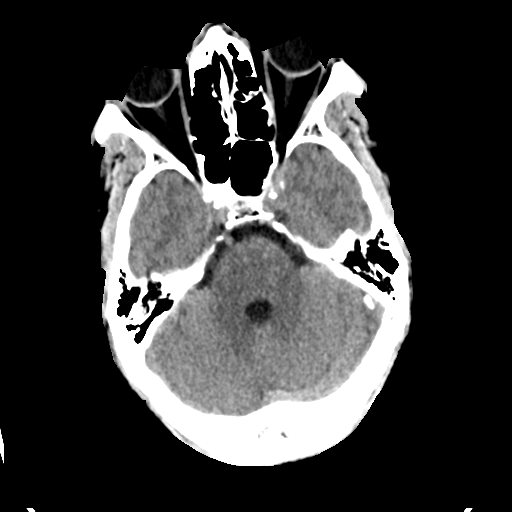
[im 12/34  brain]
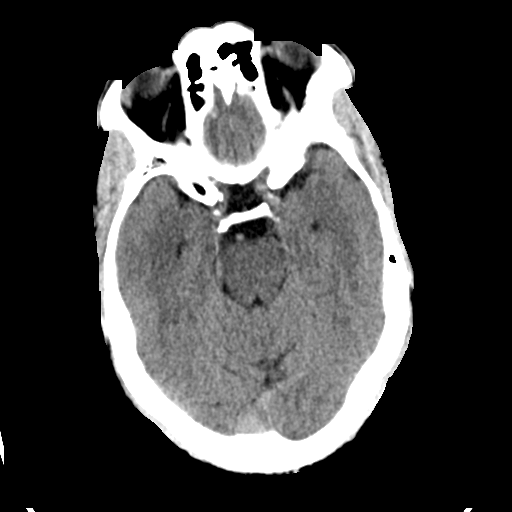
[im 15/34  brain]
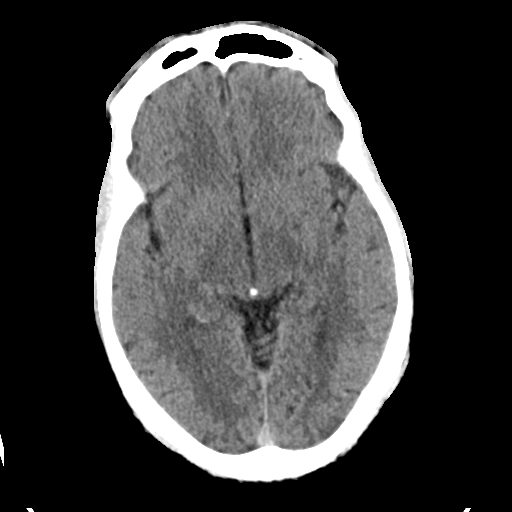
[im 15/34  bone]
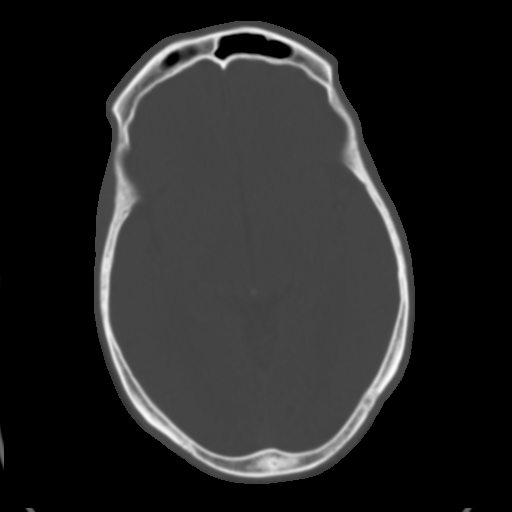
[im 19/34  brain]
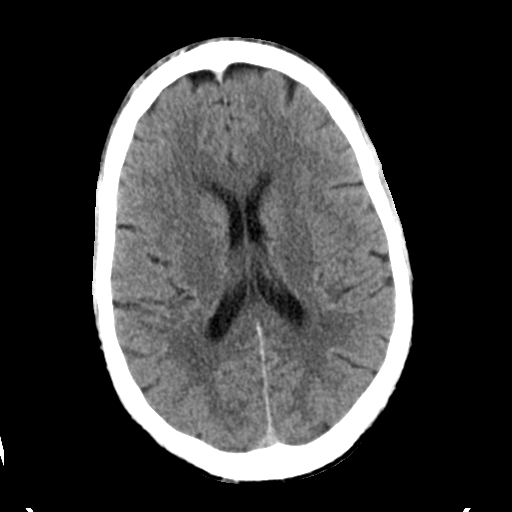
[im 22/34  brain]
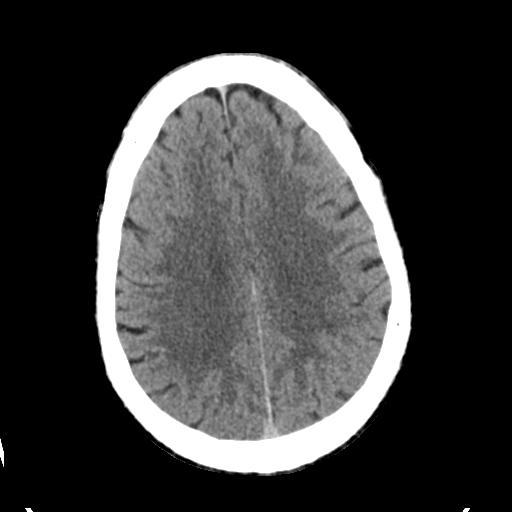
[im 26/34  brain]
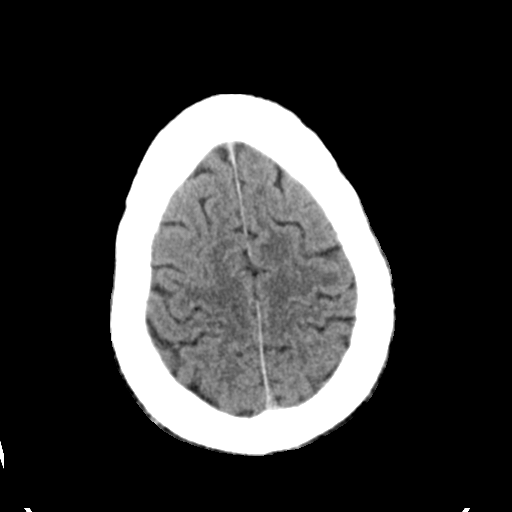
[im 28/34  brain]
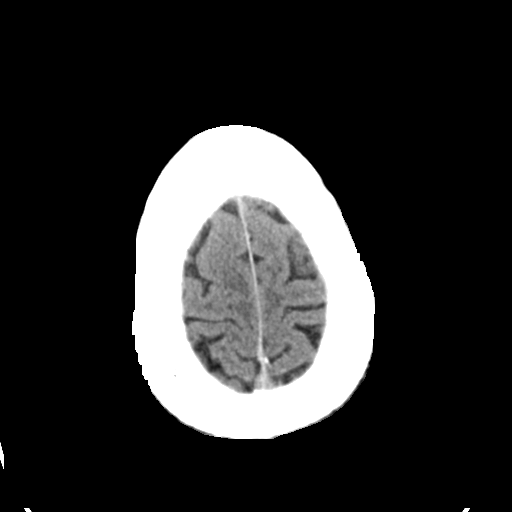
[im 28/34  bone]
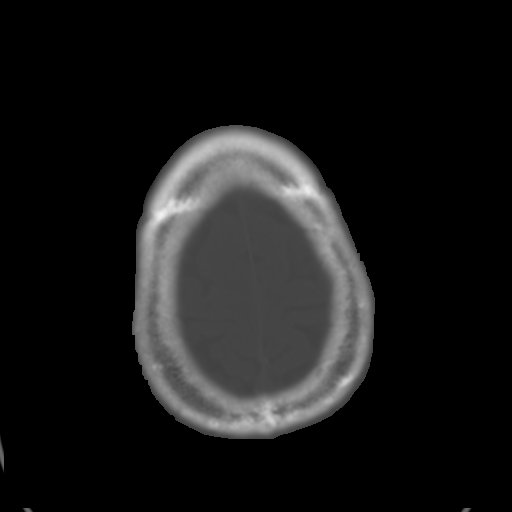
[im 31/34  brain]
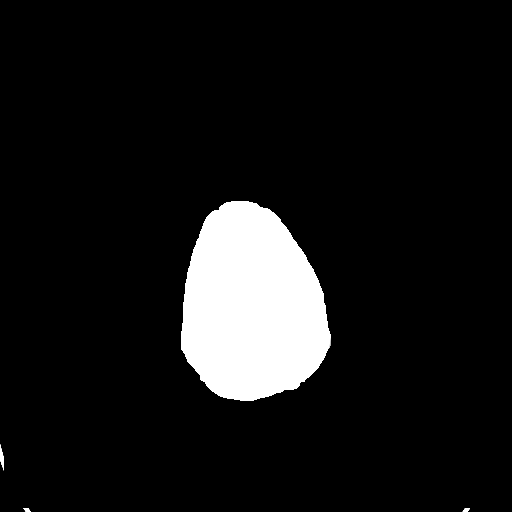

[Series 5: head 3.0 mpr cor · coronal · 0.33mm/px · 3 of 73 slices shown]
[im 25/73  brain]
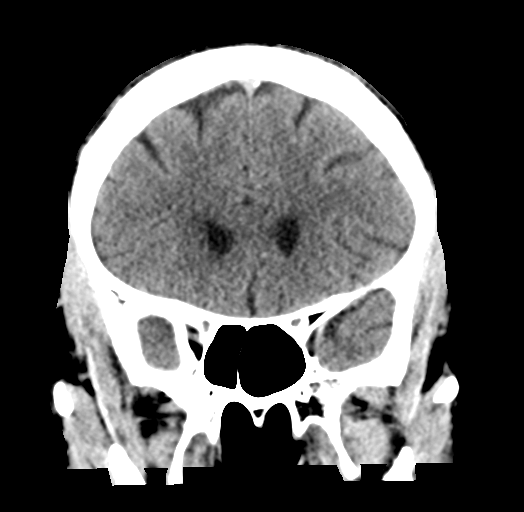
[im 33/73  brain]
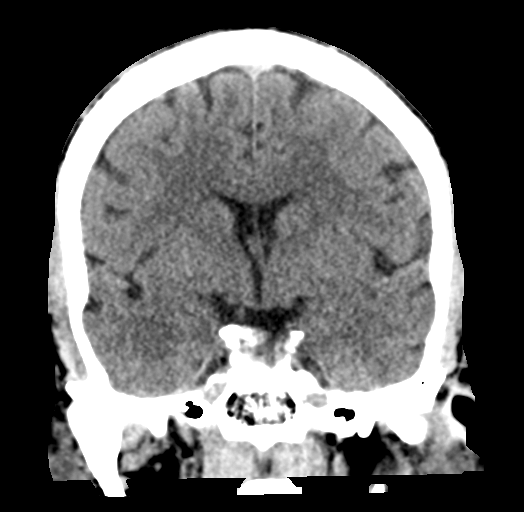
[im 41/73  brain]
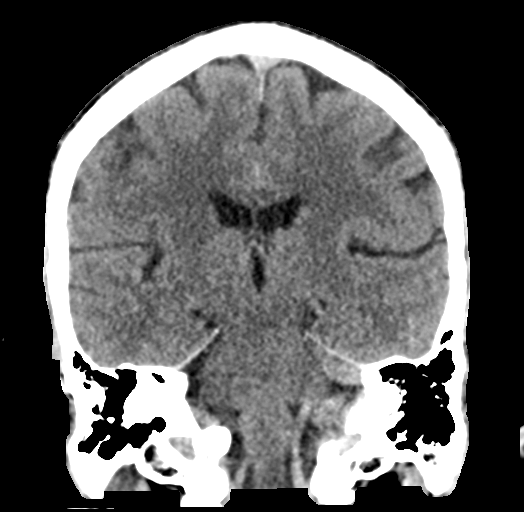

[Series 6: head 3.0 mpr sag · sagittal · 0.33mm/px · 3 of 57 slices shown]
[im 19/57  brain]
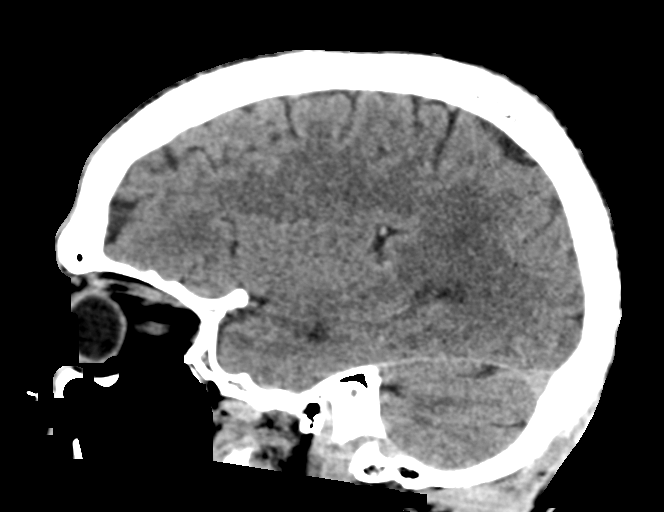
[im 29/57  brain]
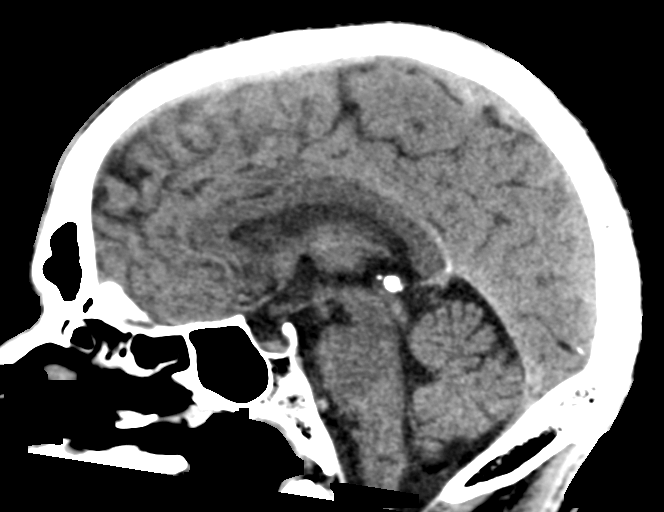
[im 38/57  brain]
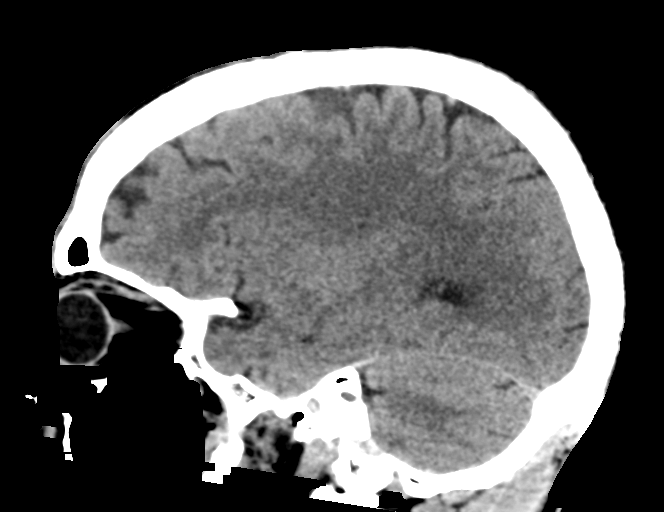

[16 of 47 positions shown; findings below may reference images not displayed]

FINDINGS: Brain: No acute intracranial abnormality. Specifically, no
hemorrhage, hydrocephalus, mass lesion, acute infarction, or
significant intracranial injury.

Vascular: No hyperdense vessel or unexpected calcification.

Skull: No acute calvarial abnormality.

Sinuses/Orbits: No acute findings

Other: None
IMPRESSION: Normal study.

## 2021-08-17 MED ORDER — SODIUM CHLORIDE 0.9% FLUSH: 3.0000 mL | Freq: Once | INTRAVENOUS | Status: DC

## 2021-08-17 NOTE — ED Notes (Signed)
PT reports he went to bed between 2200 and 2300 last night and woke up at 0430 and felt drowsy and different. Pt reports 0600 feeling dizzy while at work.

## 2021-08-17 NOTE — ED Provider Triage Note (Signed)
Emergency Medicine Provider Triage Evaluation Note  Paul Blake , a 56 y.o. male  was evaluated in triage.  History of HIV and hypertension.  Pt complains of dizziness that began while he was at work around 6 AM.  He continued to work however later in the morning he saw the nurse at work who took his blood pressure that was 90/60.  He was sent to the ER to be evaluated.  Urgent care sent him to the emergency department to rule out a stroke.  Review of Systems  Positive: Dizziness, left-sided pain, full body weakness Negative: Chest pain or shortness of breath  Physical Exam  There were no vitals taken for this visit. Gen:   Awake, no distress   Resp:  Normal effort  MSK:   Moves extremities without difficulty  Other:  No pronator drift.  4-5 finger grip on the left side.  PERRLA, no visual field deficit.  RRR, CTA B.  Medical Decision Making  Medically screening exam initiated at 5:17 PM.  Appropriate orders placed.  Paul Blake was informed that the remainder of the evaluation will be completed by another provider, this initial triage assessment does not replace that evaluation, and the importance of remaining in the ED until their evaluation is complete.    Van negative.  Apparently has felt dizzy and weak all day.  Has told some people that it started when he woke up, told me that it began at 6 AM.  Regardless, outside of stroke window   Georgeanna Lea A, PA-C 08/17/21 1720

## 2021-08-17 NOTE — ED Triage Notes (Signed)
Pt woke up this morning with dizziness and L sided weakness. Went to [facility] from his place of work and they sent him here. L sided weakness remains. LSN last night before going to bed.

## 2021-08-17 NOTE — ED Notes (Signed)
Report given to [facility] CN, states for [facility] to sent pt to triage on arrival.

## 2021-08-17 NOTE — ED Notes (Signed)
EMS transport to ED

## 2021-08-17 NOTE — ED Triage Notes (Signed)
Pt reports feeling dizzy at work  6AM to [employer]. During he lunch time he went to Nurse station for BP check, Pt reports BP 90/60 when checked by nurse at work. Pt does take BP meds each morning.

## 2021-08-17 NOTE — ED Notes (Signed)
called [facility] to transport patient to ED.   Papers printed per protocol.

## 2021-08-17 NOTE — ED Notes (Signed)
PTreports feeling dizzy since 0600 today . BP checked at work approx 1100 with a reported SBP 90"S.  Pt arrived to [facility] still feeling dizzy

## 2021-08-18 ENCOUNTER — Emergency Department (HOSPITAL_COMMUNITY): Payer: Self-pay

## 2021-08-18 IMAGING — MR MR CERVICAL SPINE WO/W CM
7 of 9 series · 26 of 48 positions shown · IV contrast (gadavist)
Comparison: None.

CLINICAL DATA: Demyelinating disease.

EXAM:
MRI CERVICAL SPINE WITHOUT AND WITH CONTRAST
TECHNIQUE: Multiplanar and multiecho pulse sequences of the cervical spine, to
include the craniocervical junction and cervicothoracic junction,
were obtained without and with intravenous contrast.
CONTRAST:  7mL GADAVIST GADOBUTROL 1 MMOL/ML IV SOLN

[Series 5: T1 · axial · 3.0mm · 0.39mm/px · z∈[-58,+83]mm · 6 of 44 slices shown (1 of 2)]
[im 1/44]
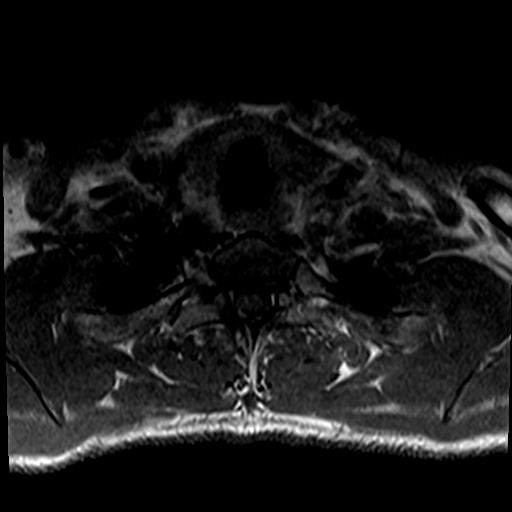
[im 9/44]
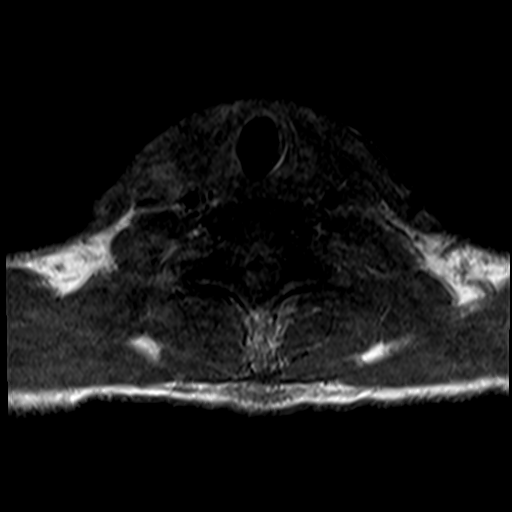
[im 18/44]
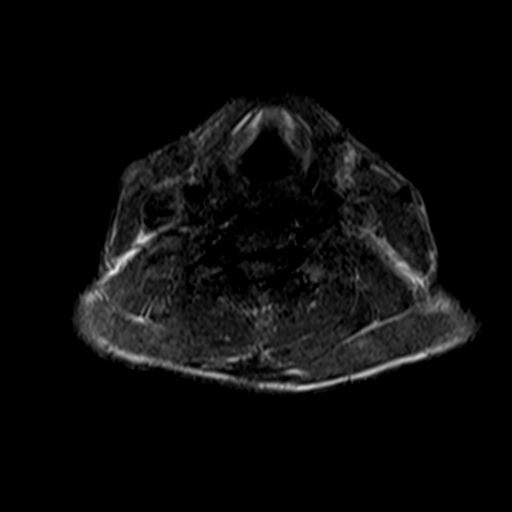
[im 26/44]
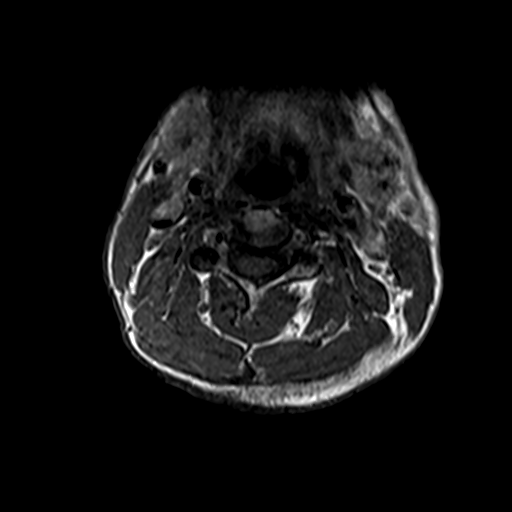
[im 35/44]
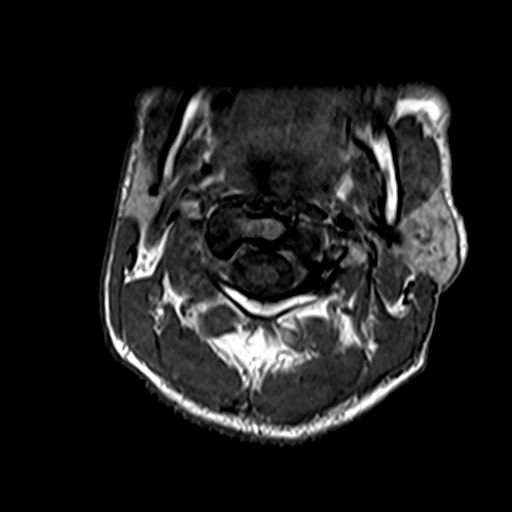
[im 44/44]
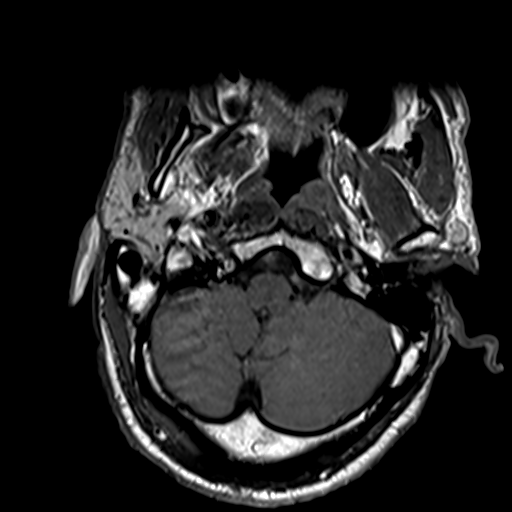

[Series 9: T2 · sagittal · 3.0mm · 0.69mm/px · 2 of 17 slices shown (1 of 2)]
[im 1/17]
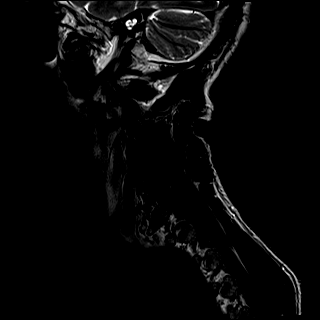
[im 17/17]
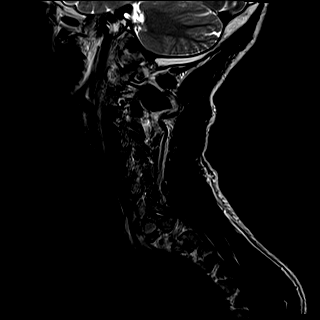

[Series 10: T1 · sagittal · 3.0mm · 0.69mm/px · 2 of 17 slices shown (2 of 2)]
[im 1/17]
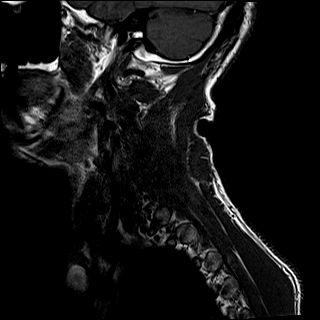
[im 17/17]
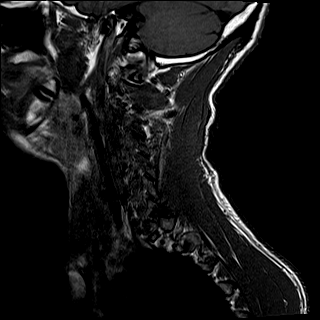

[Series 11: STIR · sagittal · 3.0mm · 0.86mm/px · 2 of 17 slices shown]
[im 1/17]
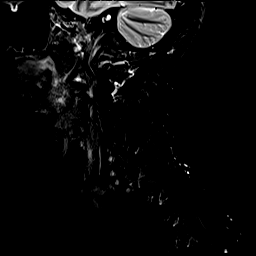
[im 17/17]
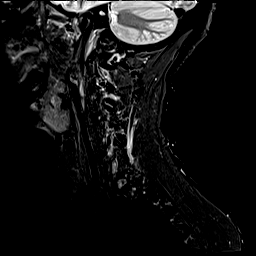

[Series 11: T1 fat-sat post-contrast · sagittal · 3.0mm · 0.43mm/px · 2 of 17 slices shown]
[im 1/17]
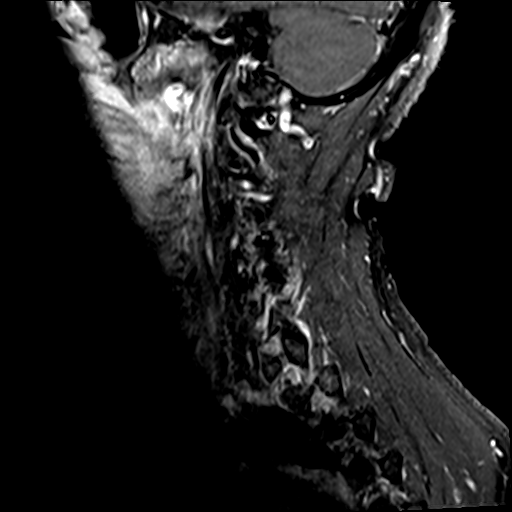
[im 17/17]
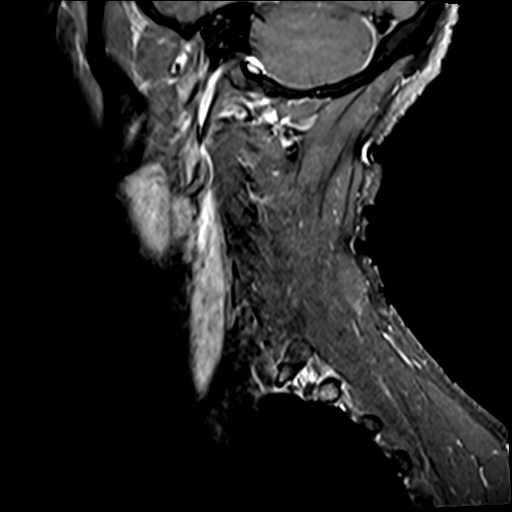

[Series 12: T2 · axial · 3.0mm · 0.66mm/px · z∈[-179,-40]mm · 6 of 42 slices shown (2 of 2)]
[im 1/42]
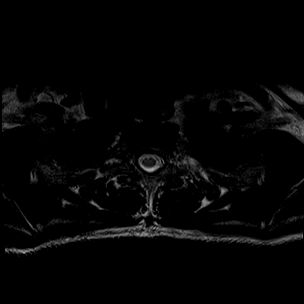
[im 9/42]
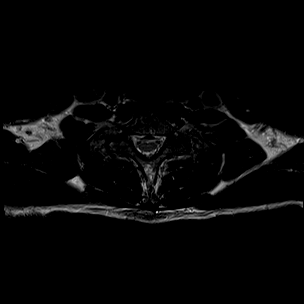
[im 17/42]
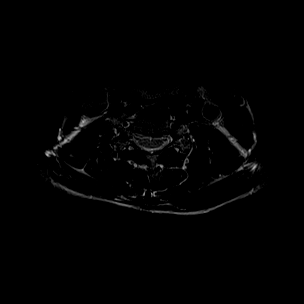
[im 25/42]
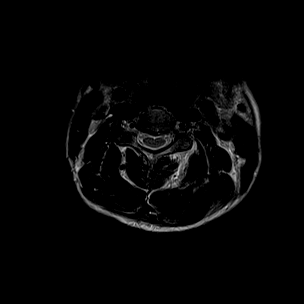
[im 33/42]
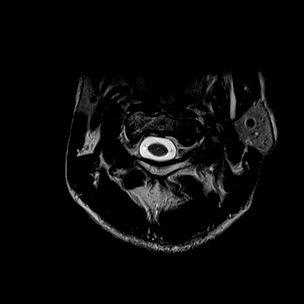
[im 42/42]
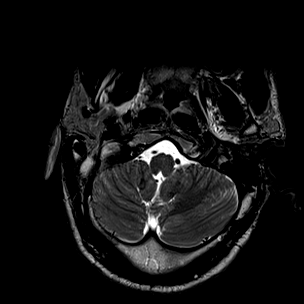

[Series 12: T1 post-contrast · axial · 3.0mm · 0.39mm/px · z∈[-58,+83]mm · 6 of 44 slices shown]
[im 1/44]
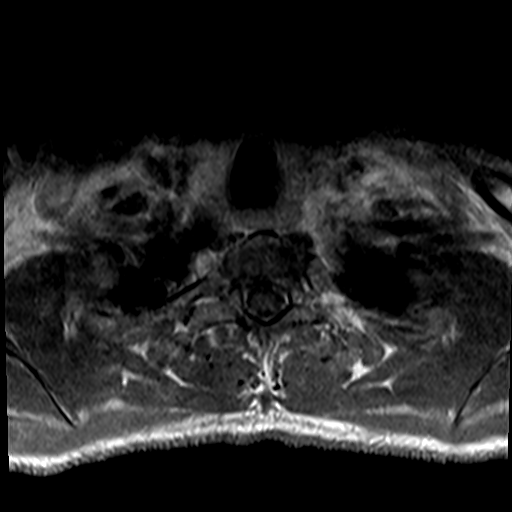
[im 9/44]
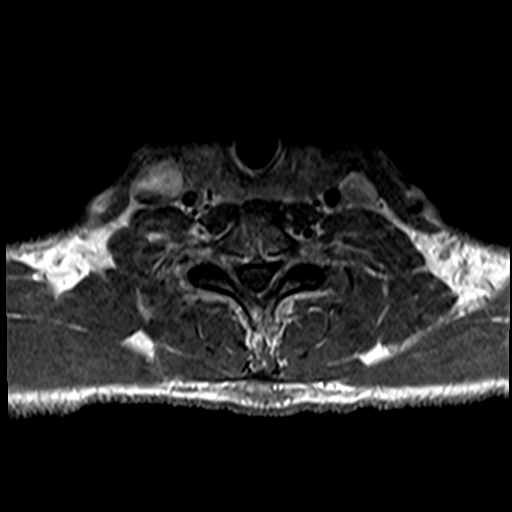
[im 18/44]
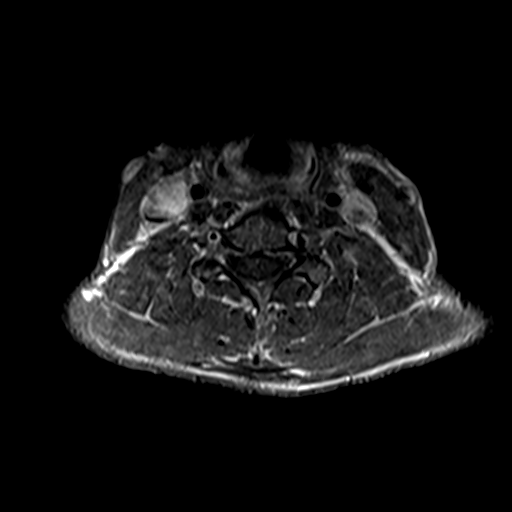
[im 26/44]
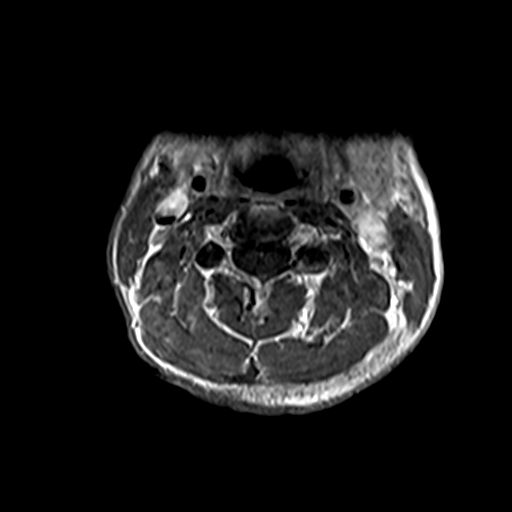
[im 35/44]
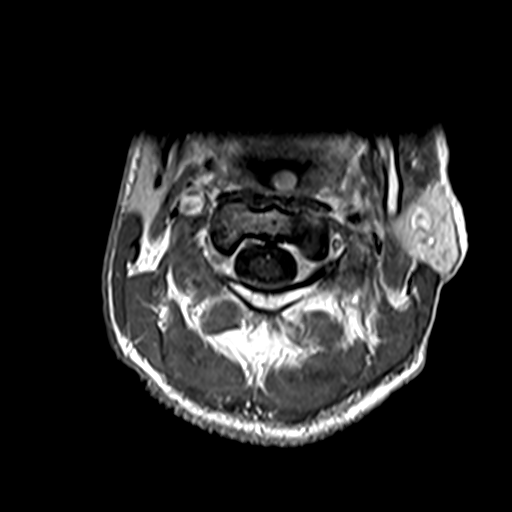
[im 44/44]
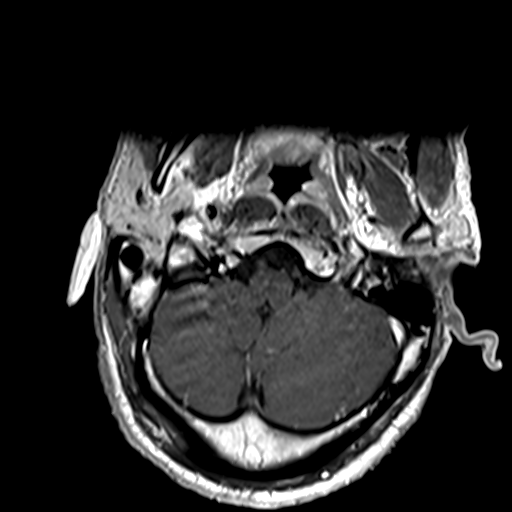

[26 of 48 positions shown; findings below may reference images not displayed]

FINDINGS: Alignment: Mild dextrocurvature.

Vertebrae: No fracture, evidence of discitis, or bone lesion.

Cord: Normal signal and morphology.

Posterior Fossa, vertebral arteries, paraspinal tissues: Negative.

Disc levels:

C2-3: Unremarkable.

C3-4: Tiny central protrusion

C4-5: Disc narrowing and right foraminal bulging/uncovertebral
spurring. Negative facets. Right foraminal narrowing is mild.

C5-6: Disc narrowing and bulging with uncovertebral and endplate
spurring eccentric to the left where there is advanced foraminal
stenosis.

C6-7: Disc narrowing and bulging with uncovertebral ridging
eccentric to the left where there is high-grade foraminal stenosis.

C7-T1:Degenerative facet spurring with ligamentous thickening. Mild
disc bulging. No neural compression
IMPRESSION: 1. Normal MRI of the cord.  No evidence of demyelination.
2. Cervical spine degeneration with left foraminal impingement at
C5-6 and C6-7.

## 2021-08-18 IMAGING — MR MR HEAD W/O CM
12 of 13 series · 44 of 48 positions shown · non-contrast
Comparison: None.

CLINICAL DATA: Acute stroke suspected.  Left arm weakness.



[Series 5: DWI · axial · 3.0mm · 0.96mm/px · z∈[-106,+72]mm · 7 of 120 slices shown (1 of 4)]
[im 1/120]
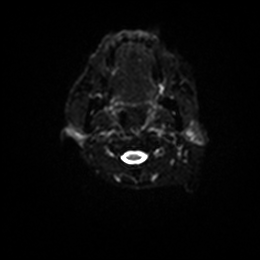
[im 20/120]
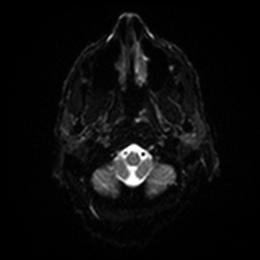
[im 40/120]
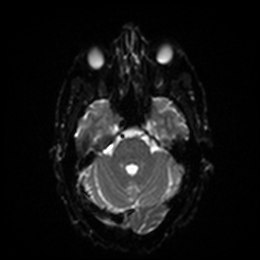
[im 60/120]
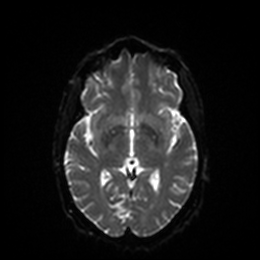
[im 80/120]
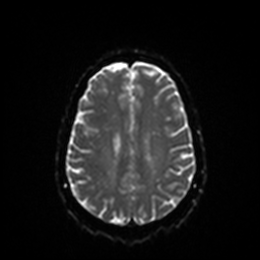
[im 100/120]
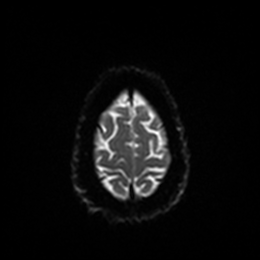
[im 120/120]
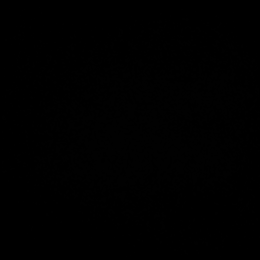

[Series 6: DWI · axial · 3.0mm · 0.96mm/px · z∈[-106,+66]mm · 4 of 59 slices shown (2 of 4)]
[im 1/59]
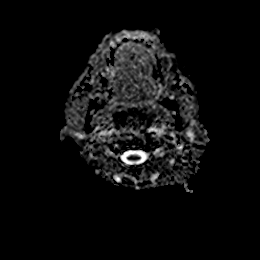
[im 20/59]
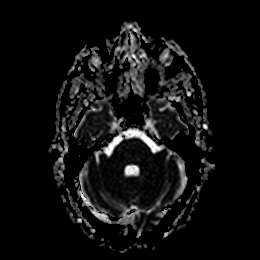
[im 39/59]
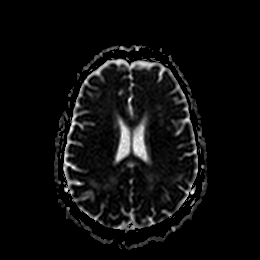
[im 59/59]
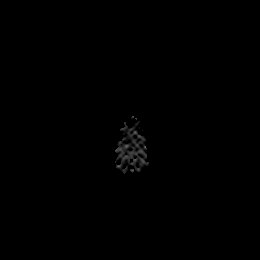

[Series 7: DWI · coronal · 4.0mm · 0.88mm/px · 6 of 86 slices shown (3 of 4)]
[im 1/86]
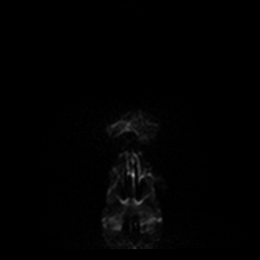
[im 18/86]
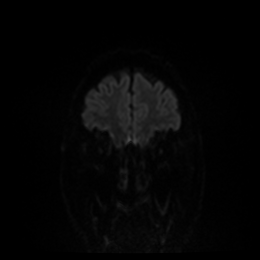
[im 35/86]
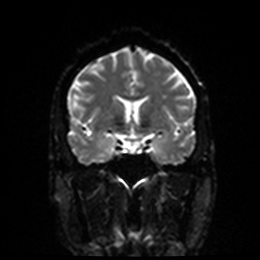
[im 52/86]
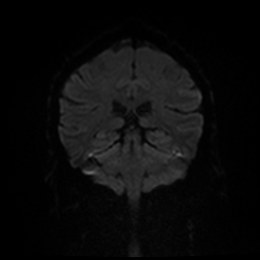
[im 69/86]
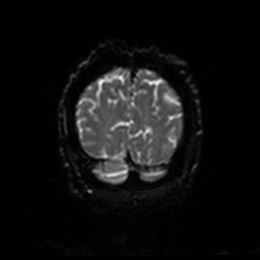
[im 86/86]
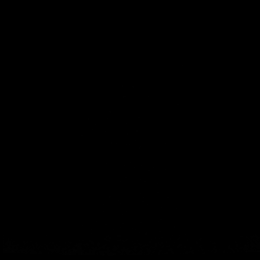

[Series 8: DWI · coronal · 4.0mm · 0.88mm/px · 3 of 42 slices shown (4 of 4)]
[im 1/42]
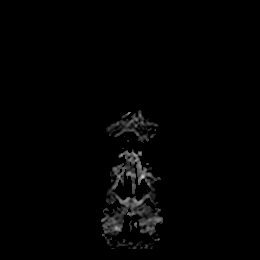
[im 21/42]
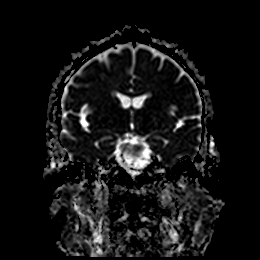
[im 42/42]
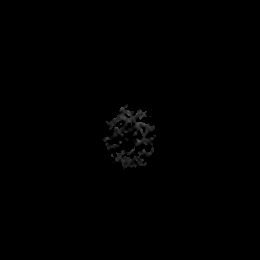

[Series 9: T1 · sagittal · 5.0mm · 0.78mm/px · 2 of 28 slices shown]
[im 1/28]
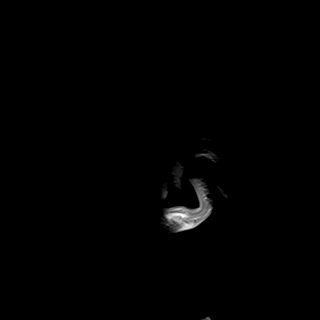
[im 28/28]
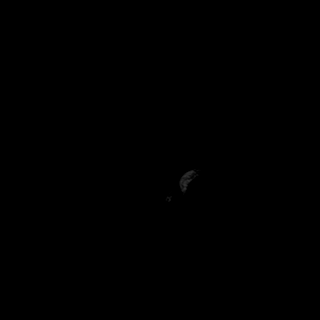

[Series 10: T2 · axial · 5.0mm · 0.78mm/px · z∈[-103,+69]mm · 2 of 30 slices shown (1 of 2)]
[im 1/30]
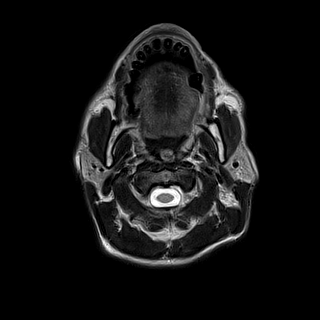
[im 30/30]
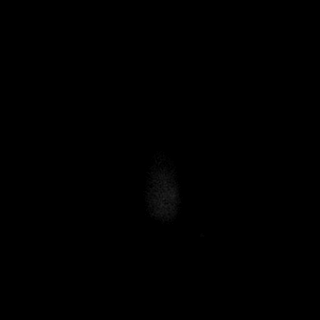

[Series 11: FLAIR · axial · 5.0mm · 0.49mm/px · z∈[-104,+67]mm · 2 of 30 slices shown]
[im 1/30]
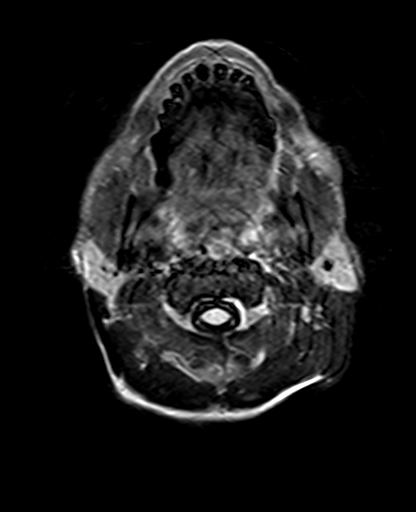
[im 30/30]
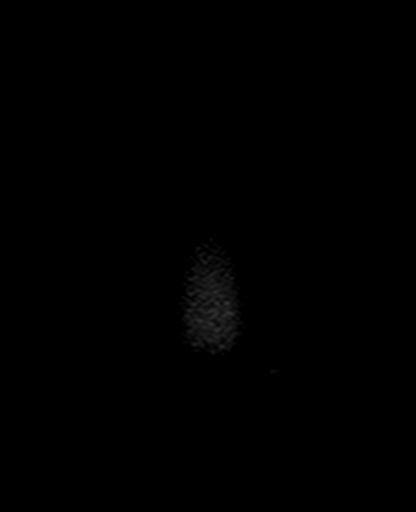

[Series 12: mag_images · axial · 3.0mm · 0.98mm/px · z∈[-106,+69]mm · 4 of 60 slices shown]
[im 1/60]
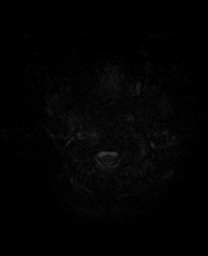
[im 20/60]
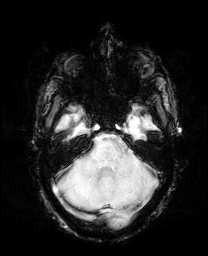
[im 40/60]
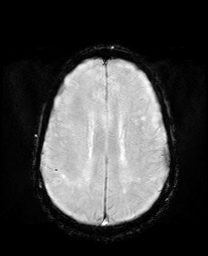
[im 60/60]
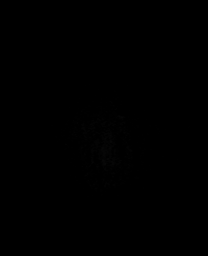

[Series 13: pha_images · axial · 3.0mm · 0.98mm/px · z∈[-106,+69]mm · 4 of 58 slices shown]
[im 1/58]
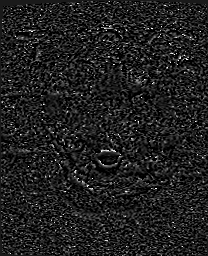
[im 20/58]
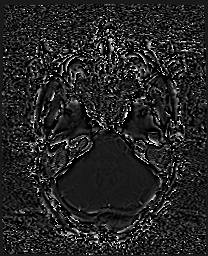
[im 39/58]
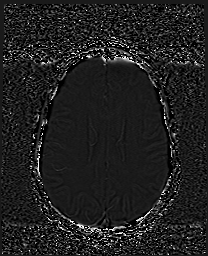
[im 58/58]
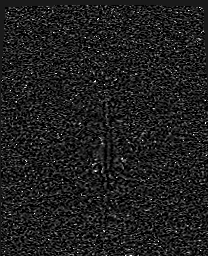

[Series 14: swi_images · axial · 3.0mm · 0.98mm/px · z∈[-106,+69]mm · 4 of 60 slices shown]
[im 1/60]
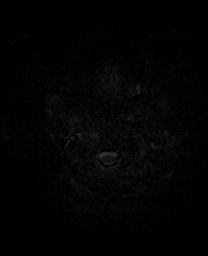
[im 20/60]
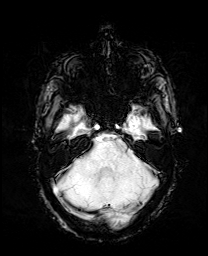
[im 40/60]
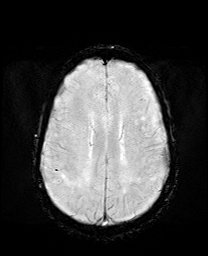
[im 60/60]
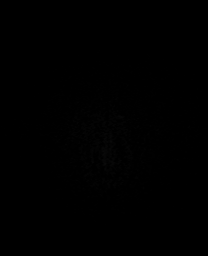

[Series 15: mip_images(sw) · axial · 24.0mm · 0.98mm/px · z∈[-95,+58]mm · 4 of 53 slices shown]
[im 1/53]
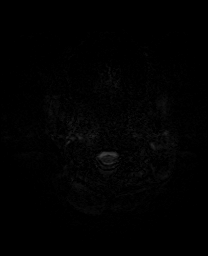
[im 18/53]
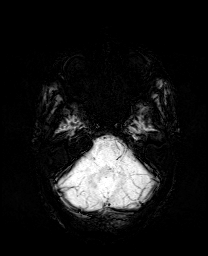
[im 35/53]
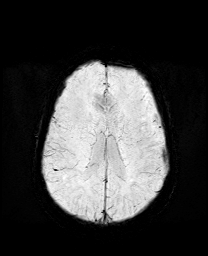
[im 53/53]
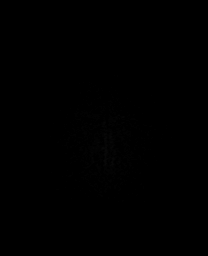

[Series 17: T2 · coronal · 5.0mm · 0.34mm/px · 2 of 36 slices shown (2 of 2)]
[im 1/36]
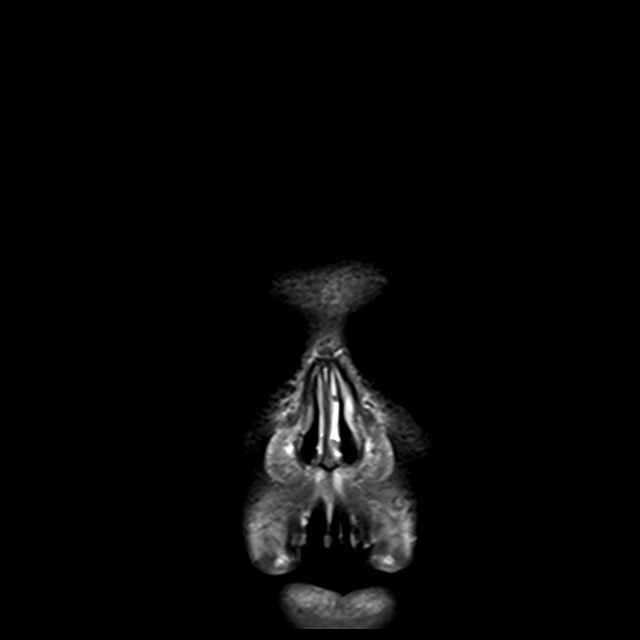
[im 36/36]
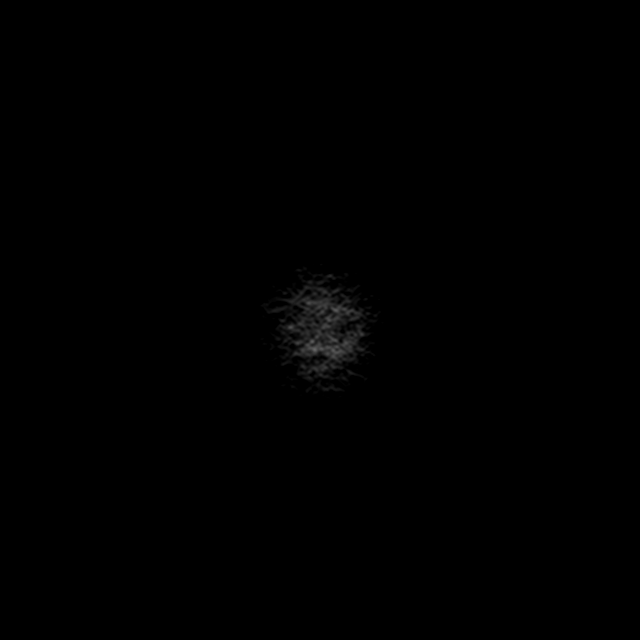

[44 of 48 positions shown; findings below may reference images not displayed]

FINDINGS: MRI HEAD FINDINGS

Brain: No acute infarction, hemorrhage, hydrocephalus, extra-axial
collection or mass lesion.

Mild white matter disease with small patchy FLAIR hyperintensities
in the deep cerebral white matter in a nonspecific pattern.

Vascular: Normal flow voids.

Skull and upper cervical spine: Normal marrow signal.

Sinuses/Orbits: Negative.

MRA HEAD FINDINGS

No branch occlusion, beading, or aneurysm. Mild atheromatous
undulation of branch vessels is possible. Vertebrobasilar tortuosity
without dilatation.

MRA NECK FINDINGS

Antegrade flow in the carotid and vertebral arteries. Covered arch
is normal diameter. Allowing for typical motion artifact there is no
evidence of stenosis, ulceration, or vasculopathy.
IMPRESSION: Brain MRI:

1. No acute finding.
2. Mild white matter disease with nonspecific pattern and history of
hypertension favoring chronic small vessel ischemia.

Intracranial MRA:

No emergent finding or flow limiting stenosis.

Neck MRA:

Negative.

## 2021-08-18 IMAGING — MR MR MRA HEAD W/O CM
1 series · 19 of 48 positions shown · non-contrast
Comparison: None.

CLINICAL DATA: Acute stroke suspected.  Left arm weakness.



[Series 5: 3d cow · axial · 0.5mm · 0.41mm/px · z∈[-75,+6]mm · 19 of 172 slices shown]
[im 1/172]
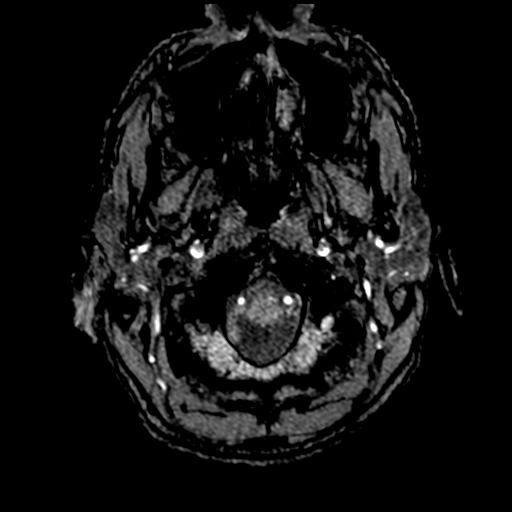
[im 4/172]
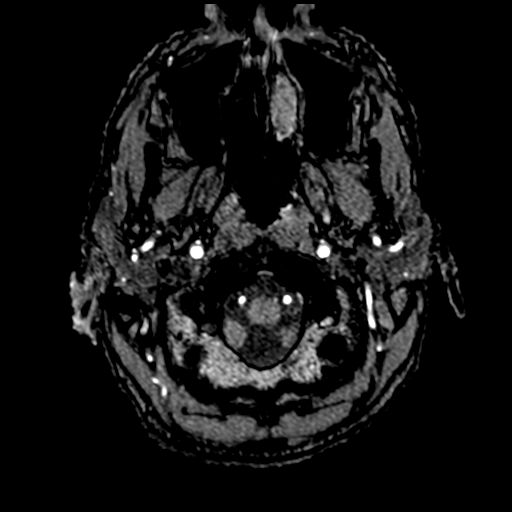
[im 8/172]
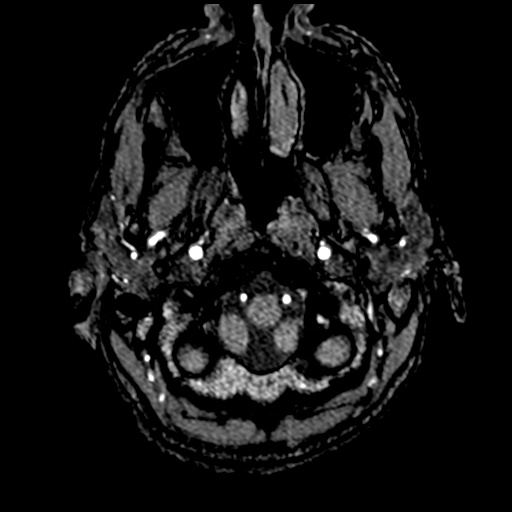
[im 11/172]
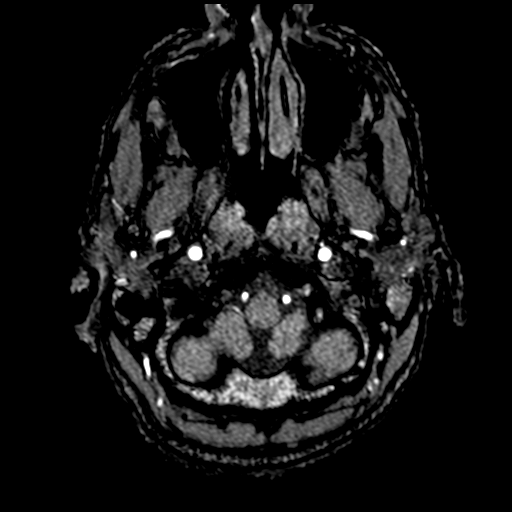
[im 15/172]
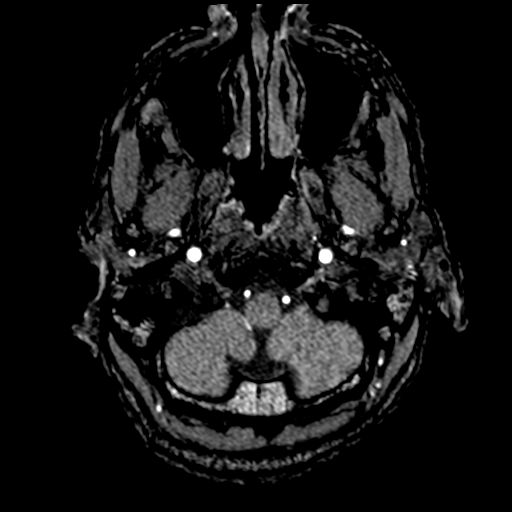
[im 19/172]
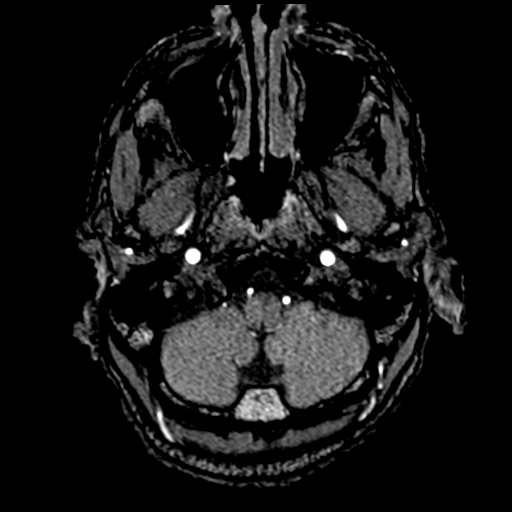
[im 22/172]
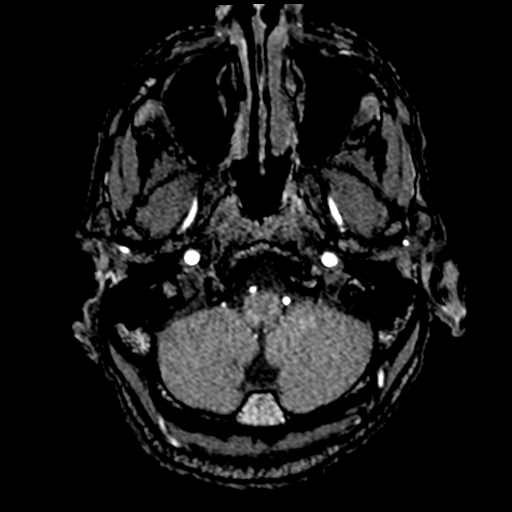
[im 26/172]
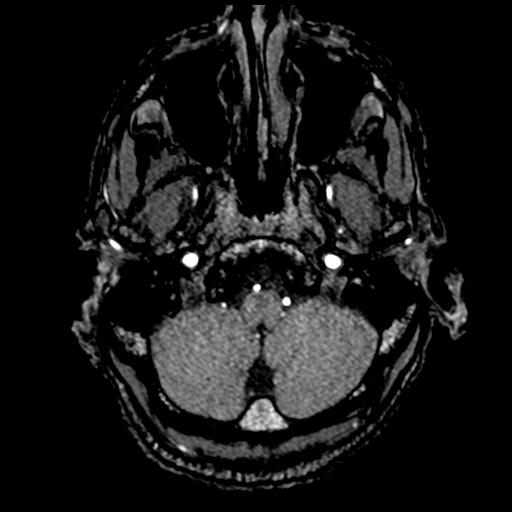
[im 30/172]
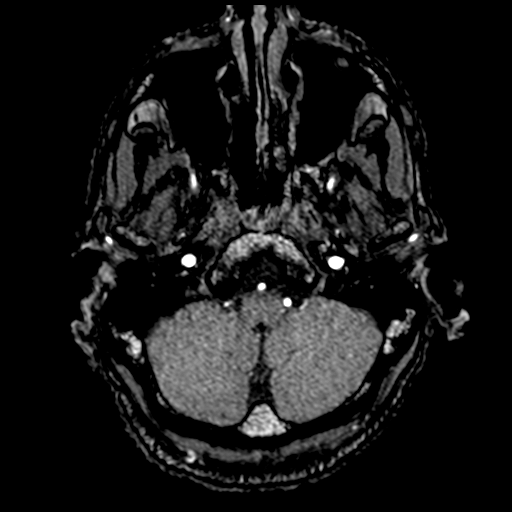
[im 33/172]
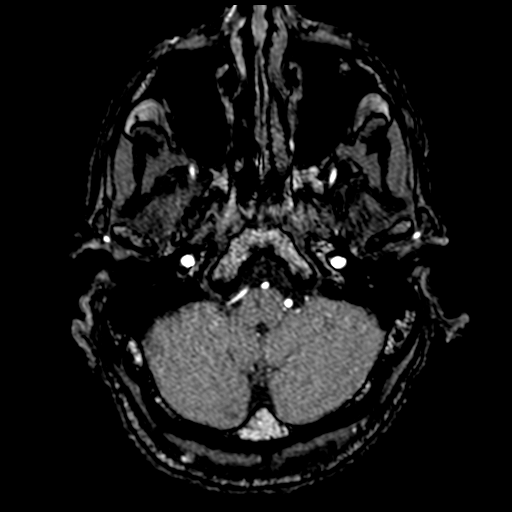
[im 37/172]
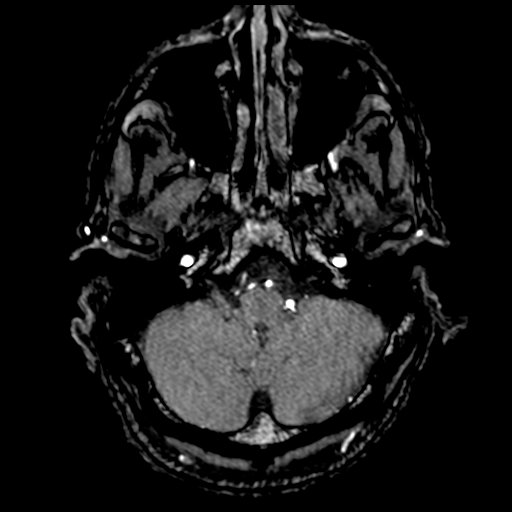
[im 55/172]
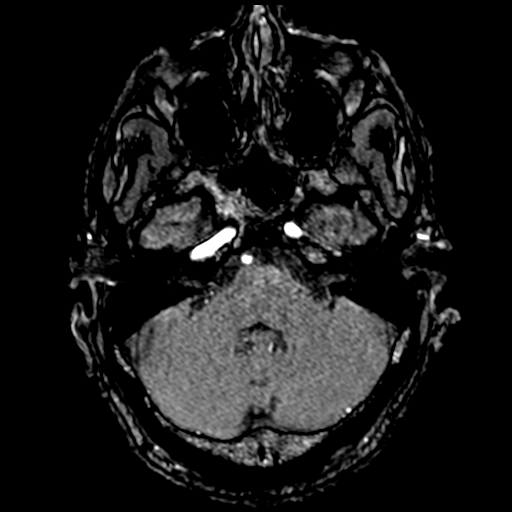
[im 77/172]
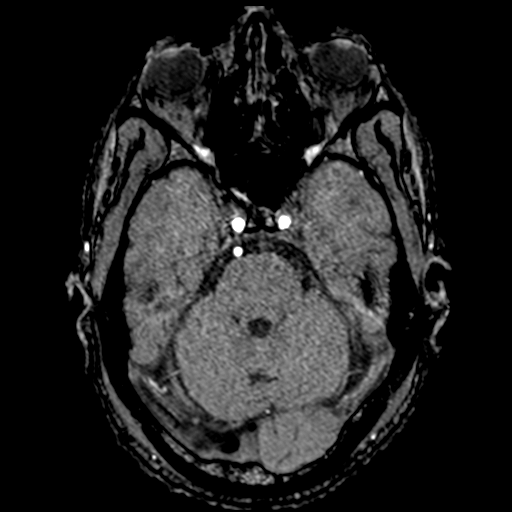
[im 88/172]
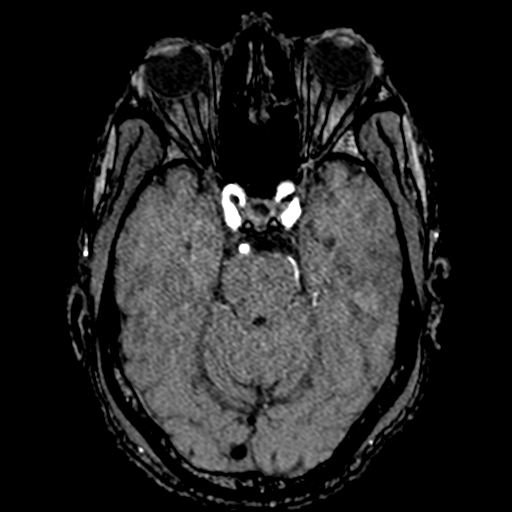
[im 99/172]
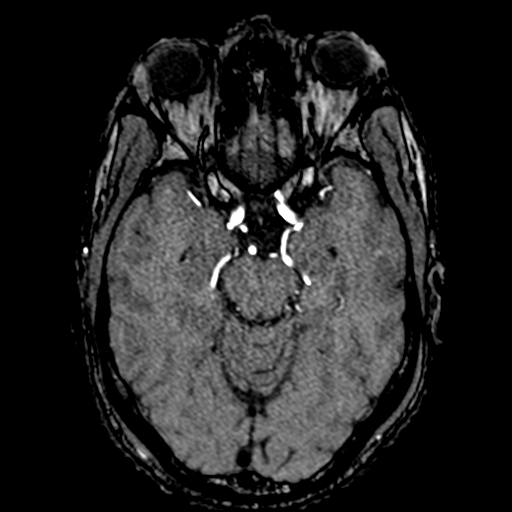
[im 121/172]
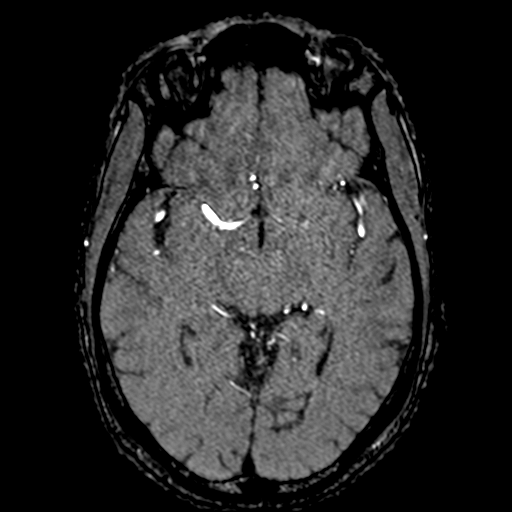
[im 142/172]
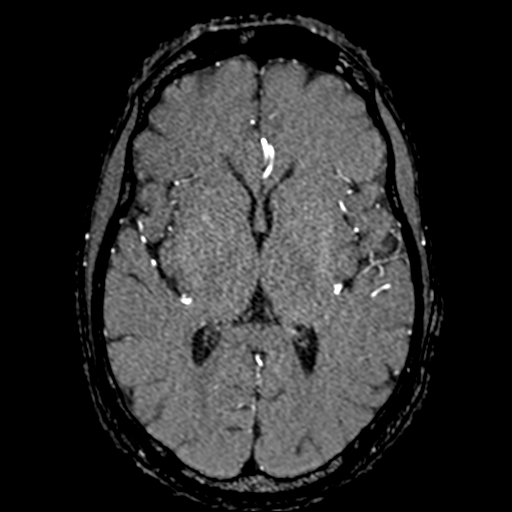
[im 146/172]
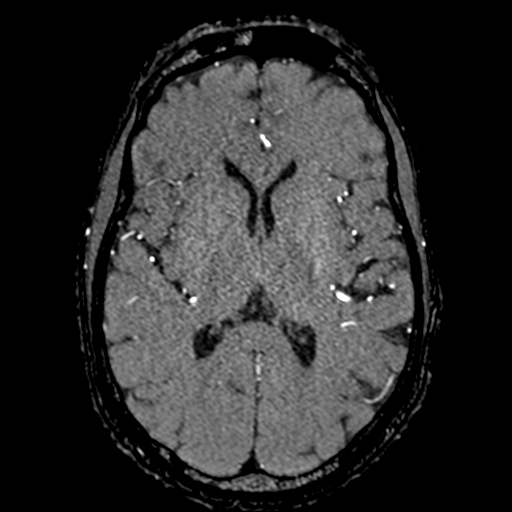
[im 164/172]
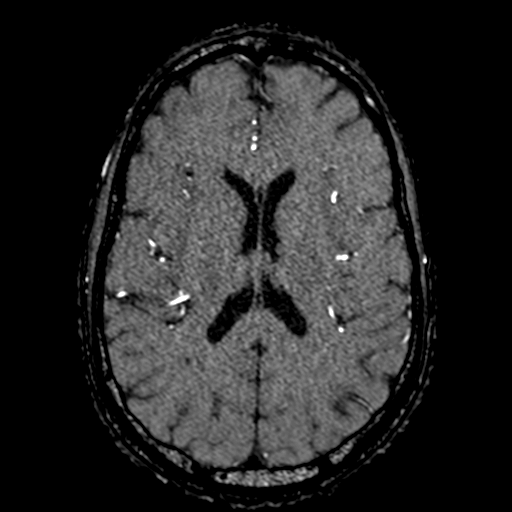

[19 of 48 positions shown; findings below may reference images not displayed]

FINDINGS: MRI HEAD FINDINGS

Brain: No acute infarction, hemorrhage, hydrocephalus, extra-axial
collection or mass lesion.

Mild white matter disease with small patchy FLAIR hyperintensities
in the deep cerebral white matter in a nonspecific pattern.

Vascular: Normal flow voids.

Skull and upper cervical spine: Normal marrow signal.

Sinuses/Orbits: Negative.

MRA HEAD FINDINGS

No branch occlusion, beading, or aneurysm. Mild atheromatous
undulation of branch vessels is possible. Vertebrobasilar tortuosity
without dilatation.

MRA NECK FINDINGS

Antegrade flow in the carotid and vertebral arteries. Covered arch
is normal diameter. Allowing for typical motion artifact there is no
evidence of stenosis, ulceration, or vasculopathy.
IMPRESSION: Brain MRI:

1. No acute finding.
2. Mild white matter disease with nonspecific pattern and history of
hypertension favoring chronic small vessel ischemia.

Intracranial MRA:

No emergent finding or flow limiting stenosis.

Neck MRA:

Negative.

## 2021-08-18 MED ORDER — ACETAMINOPHEN 500 MG PO TABS
1000.0000 mg | ORAL_TABLET | Freq: Once | ORAL | Status: AC
  Administered 2021-08-18: 02:00:00 1000 mg via ORAL
  Filled 2021-08-18: qty 2

## 2021-08-18 MED ORDER — GADOBUTROL 1 MMOL/ML IV SOLN
7.0000 mL | Freq: Once | INTRAVENOUS | Status: AC | PRN
  Administered 2021-08-18: 08:00:00 7 mL via INTRAVENOUS

## 2021-08-18 MED ORDER — PROCHLORPERAZINE EDISYLATE 10 MG/2ML IJ SOLN
10.0000 mg | Freq: Once | INTRAMUSCULAR | Status: AC
  Administered 2021-08-18: 02:00:00 10 mg via INTRAVENOUS
  Filled 2021-08-18: qty 2

## 2021-08-18 MED ORDER — LACTATED RINGERS IV BOLUS
1000.0000 mL | Freq: Once | INTRAVENOUS | Status: AC
  Administered 2021-08-18: 04:00:00 1000 mL via INTRAVENOUS

## 2021-08-18 MED ORDER — DIPHENHYDRAMINE HCL 50 MG/ML IJ SOLN
25.0000 mg | Freq: Once | INTRAMUSCULAR | Status: AC
  Administered 2021-08-18: 02:00:00 25 mg via INTRAVENOUS
  Filled 2021-08-18: qty 1

## 2021-08-18 NOTE — ED Provider Notes (Signed)
[facility] Emergency Department Provider Note MRN:  [id number]  Arrival date & time: 08/18/21     Chief Complaint   Dizziness and Weakness   History of Present Illness   Paul Blake is a 56 y.o. year-old male with a history of hypertension presenting to the ED with chief complaint of dizziness and weakness.  2 days ago at work patient felt lightheaded, was evaluated by a nurse and told he had low blood pressure and so was not allowed to go back to work.  Woke up the next morning with dizziness and weakness to the left arm and left leg.  He also has numbness to the left arm and left leg.  Symptoms constant since 7 AM on 1-23.  Review of Systems  A thorough review of systems was obtained and all systems are negative except as noted in the HPI and PMH.   Patient's Health History    Past Medical History:  Diagnosis Date   Hypertension    Tuberculosis     Past Surgical History:  Procedure Laterality Date   no past surgery       No family history on file.  Social History   Socioeconomic History   Marital status: Single    Spouse name: Not on file   Number of children: Not on file   Years of education: Not on file   Highest education level: Not on file  Occupational History   Not on file  Tobacco Use   Smoking status: Every Day    Types: Cigarettes, Cigars   Smokeless tobacco: Never   Tobacco comments:    cigars and black and milds 2-3 a week   Substance and Sexual Activity   Alcohol use: Not Currently   Drug use: Never   Sexual activity: Not Currently    Comment: declined condoms 12/10/20  Other Topics Concern   Not on file  Social History Narrative   Not on file   Social Determinants of Health   Financial Resource Strain: Not on file  Food Insecurity: Not on file  Transportation Needs: Not on file  Physical Activity: Not on file  Stress: Not on file  Social Connections: Not on file  Intimate Partner Violence: Not on file      Physical Exam   Vitals:   08/18/21 0526 08/18/21 0613  BP: 127/79 135/83  Pulse: 70 66  Resp: 16 17  Temp:    SpO2: 97% 98%    CONSTITUTIONAL: Well-appearing, NAD NEURO/PSYCH:  Alert and oriented x 3, decreased strength to left arm and left leg, decreased sensation to left arm and left leg, no visual field cuts, no aphasia, no neglect EYES:  eyes equal and reactive ENT/NECK:  no LAD, no JVD CARDIO: Regular rate, well-perfused, normal S1 and S2 PULM:  CTAB no wheezing or rhonchi GI/GU:  non-distended, non-tender MSK/SPINE:  No gross deformities, no edema SKIN:  no rash, atraumatic   *Additional and/or pertinent findings included in MDM below  Diagnostic and Interventional Summary    EKG Interpretation  Date/Time:  Monday August 17 2021 17:11:59 EST Ventricular Rate:  74 PR Interval:  160 QRS Duration: 126 QT Interval:  414 QTC Calculation: 459 R Axis:   -77 Text Interpretation: Normal sinus rhythm Right bundle branch block Left anterior fascicular block  Bifascicular block  Left ventricular hypertrophy with repolarization abnormality ( R in aVL , Romhilt-Estes ) Cannot rule out Septal infarct , age undetermined Abnormal ECG No previous ECGs available Confirmed by  Gerlene Fee [phone]) on 08/18/2021 1:34:10 AM       Labs Reviewed  COMPREHENSIVE METABOLIC PANEL - Abnormal; Notable for the following components:      Result Value   Potassium 3.2 (*)    Calcium 8.7 (*)    All other components within normal limits  I-STAT CHEM 8, ED - Abnormal; Notable for the following components:   Potassium 3.1 (*)    All other components within normal limits  PROTIME-INR  APTT  CBC  DIFFERENTIAL  CBG MONITORING, ED    MR BRAIN WO CONTRAST  Final Result    MR ANGIO HEAD WO CONTRAST  Final Result    MR ANGIO NECK WO CONTRAST  Final Result    CT HEAD WO CONTRAST  Final Result    MR CERVICAL SPINE W WO CONTRAST    (Results Pending)  MR BRAIN W CONTRAST    (Results  Pending)    Medications  sodium chloride flush (NS) 0.9 % injection 3 mL (3 mLs Intravenous Not Given 08/18/21 0230)  acetaminophen (TYLENOL) tablet 1,000 mg (1,000 mg Oral Given 08/18/21 0218)  diphenhydrAMINE (BENADRYL) injection 25 mg (25 mg Intravenous Given 08/18/21 0219)  prochlorperazine (COMPAZINE) injection 10 mg (10 mg Intravenous Given 08/18/21 0219)  lactated ringers bolus 1,000 mL (0 mLs Intravenous Stopped 08/18/21 0432)     Procedures  /  Critical Care .Critical Care Performed by: Maudie Flakes, MD Authorized by: Maudie Flakes, MD   Critical care provider statement:    Critical care time (minutes):  35   Critical care was necessary to treat or prevent imminent or life-threatening deterioration of the following conditions:  CNS failure or compromise (Concern for acute ischemic stroke)   Critical care was time spent personally by me on the following activities:  Development of treatment plan with patient or surrogate, discussions with consultants, evaluation of patient's response to treatment, examination of patient, ordering and review of laboratory studies, ordering and review of radiographic studies, ordering and performing treatments and interventions, pulse oximetry, re-evaluation of patient's condition and review of old charts  ED Course and Medical Decision Making  Initial Impression and Ddx Concern for acute ischemic stroke with last known well now 18 hours ago.  Van negative.  Awaiting MRI, anticipating admission.  Past medical/surgical history that increases complexity of ED encounter: None  Interpretation of Diagnostics I personally reviewed the EKG and my interpretation is as follows: Bundle branch block, no prior for comparison    Labs within normal limits, MR imaging is without evidence of stroke or explanation of patient's symptoms.  Patient Reassessment and Ultimate Disposition/Management Patient continues to have left-sided deficits, he does have decreased  sensation of the face as well but no facial droop.  Discussed case with Dr. Leonel Ramsay of neurology, will obtain contrasted images of the brain as well as MRI of the cervical spine.  Differential diagnosis includes cervical spine lesion, MS.  Complex migraine also consideration given patient's headache.  Signed out to oncoming provider at shift change.  Patient management required discussion with the following services or consulting groups:  Neurology  Complexity of Problems Addressed Acute complicated illness or Injury  Additional Data Reviewed and Analyzed Further history obtained from: Further history from spouse/family member  Factors Impacting ED Encounter Risk Consideration of hospitalization  Barth Kirks. Sedonia Small, MD [place] [email or web address]  Final Clinical Impressions(s) / ED Diagnoses     ICD-10-CM   1. Neurological deficit present  R29.818  ED Discharge Orders     None        Discharge Instructions Discussed with and Provided to Patient:   Discharge Instructions   None      Maudie Flakes, MD 08/18/21 [phone]

## 2021-08-18 NOTE — ED Notes (Signed)
Patient transported to MRI 

## 2021-08-18 NOTE — ED Provider Notes (Addendum)
Given the patient has been here for 7 hours I reassessed patient.  He has left arm and left leg weakness potentially some mild facial droop concerning for stroke will obtain MRI brain.  CT head unremarkable.   Physical Exam  BP 105/69 (BP Location: Left Arm)    Pulse 75    Temp 99 F (37.2 C)    Resp 15    SpO2 98%   Physical Exam  Procedures  Procedures Results for orders placed or performed during the hospital encounter of 08/17/21  Protime-INR  Result Value Ref Range   Prothrombin Time 12.8 11.4 - 15.2 seconds   INR 1.0 0.8 - 1.2  APTT  Result Value Ref Range   aPTT 30 24 - 36 seconds  CBC  Result Value Ref Range   WBC 5.5 4.0 - 10.5 K/uL   RBC 5.09 4.22 - 5.81 MIL/uL   Hemoglobin 14.9 13.0 - 17.0 g/dL   HCT 23.3 00.7 - 62.2 %   MCV 87.2 80.0 - 100.0 fL   MCH 29.3 26.0 - 34.0 pg   MCHC 33.6 30.0 - 36.0 g/dL   RDW 63.3 35.4 - 56.2 %   Platelets 183 150 - 400 K/uL   nRBC 0.0 0.0 - 0.2 %  Differential  Result Value Ref Range   Neutrophils Relative % 46 %   Neutro Abs 2.5 1.7 - 7.7 K/uL   Lymphocytes Relative 39 %   Lymphs Abs 2.2 0.7 - 4.0 K/uL   Monocytes Relative 11 %   Monocytes Absolute 0.6 0.1 - 1.0 K/uL   Eosinophils Relative 3 %   Eosinophils Absolute 0.2 0.0 - 0.5 K/uL   Basophils Relative 1 %   Basophils Absolute 0.0 0.0 - 0.1 K/uL   Immature Granulocytes 0 %   Abs Immature Granulocytes 0.01 0.00 - 0.07 K/uL  Comprehensive metabolic panel  Result Value Ref Range   Sodium 137 135 - 145 mmol/L   Potassium 3.2 (L) 3.5 - 5.1 mmol/L   Chloride 106 98 - 111 mmol/L   CO2 22 22 - 32 mmol/L   Glucose, Bld 91 70 - 99 mg/dL   BUN 18 6 - 20 mg/dL   Creatinine, Ser 5.63 0.61 - 1.24 mg/dL   Calcium 8.7 (L) 8.9 - 10.3 mg/dL   Total Protein 6.9 6.5 - 8.1 g/dL   Albumin 4.1 3.5 - 5.0 g/dL   AST 18 15 - 41 U/L   ALT 15 0 - 44 U/L   Alkaline Phosphatase 62 38 - 126 U/L   Total Bilirubin 0.6 0.3 - 1.2 mg/dL   GFR, Estimated >89 >37 mL/min   Anion gap 9 5 - 15   I-stat chem 8, ED  Result Value Ref Range   Sodium 140 135 - 145 mmol/L   Potassium 3.1 (L) 3.5 - 5.1 mmol/L   Chloride 103 98 - 111 mmol/L   BUN 19 6 - 20 mg/dL   Creatinine, Ser 3.42 0.61 - 1.24 mg/dL   Glucose, Bld 93 70 - 99 mg/dL   Calcium, Ion 8.76 8.11 - 1.40 mmol/L   TCO2 26 22 - 32 mmol/L   Hemoglobin 15.6 13.0 - 17.0 g/dL   HCT 57.2 62.0 - 35.5 %   CT HEAD WO CONTRAST  Result Date: 08/17/2021 CLINICAL DATA:  Neuro deficit, acute, stroke suspected.  Dizziness. EXAM: CT HEAD WITHOUT CONTRAST TECHNIQUE: Contiguous axial images were obtained from the base of the skull through the vertex without intravenous contrast. RADIATION DOSE REDUCTION: This  exam was performed according to the departmental dose-optimization program which includes automated exposure control, adjustment of the mA and/or kV according to patient size and/or use of iterative reconstruction technique. COMPARISON:  None. FINDINGS: Brain: No acute intracranial abnormality. Specifically, no hemorrhage, hydrocephalus, mass lesion, acute infarction, or significant intracranial injury. Vascular: No hyperdense vessel or unexpected calcification. Skull: No acute calvarial abnormality. Sinuses/Orbits: No acute findings Other: None IMPRESSION: Normal study. Electronically Signed   By: Charlett Nose M.D.   On: 08/17/2021 19:07    ED Course / MDM    Medical Decision Making Amount and/or Complexity of Data Reviewed Radiology: ordered.   Medically screening exam initiated at 12:14 PM.  Appropriate orders placed.  Virlan Kempker was informed that the remainder of the evaluation will be completed by another provider, this initial triage assessment does not replace that evaluation, and the importance of remaining in the ED until their evaluation is complete.  Brain MRI ordered      Gailen Shelter, PA 08/18/21 0014    Gailen Shelter, PA 08/18/21 0014    Gailen Shelter, PA 08/18/21 0015    Sabas Sous,  MD 08/18/21 [phone]

## 2021-08-18 NOTE — ED Provider Notes (Signed)
°  Physical Exam  BP 127/89    Pulse 85    Temp 97.6 F (36.4 C) (Oral)    Resp 20    SpO2 98%     Procedures  Procedures  ED Course / MDM    Medical Decision Making Amount and/or Complexity of Data Reviewed Labs: ordered. Radiology: ordered.  Risk OTC drugs. Prescription drug management.   56 year old male presenting to the emergency department with a headache and left-sided neurologic deficits.  He was found to have left arm and left leg weakness with some potential mild facial droop concerning for stroke.  MRI brain and brain negative for acute stroke.  The patient was discussed overnight with neurology who had recommended evaluating further with MRI imaging of the brain with contrast and MRI of the cervical spine to rule out cervical spine lesion.  Given the patient's headache, complex migraine also considered.  MRI imaging of the brain with and without contrast, MRI of the cervical spine ultimately resulted negative for any clear etiology of the patient's presentation.  Discussed the case with Dr. Rory Percy of neurology who felt that presentation most consistent with complex migraine.  On my assessment, the patient's headache had resolved.  He felt symptomatically improved.  His neurologic exam is significant for improved strength in the left hemibody compared to his exam overnight.  Overall for the patient is stable for discharge with outpatient neurology follow-up.       Regan Lemming, MD 08/18/21 [phone]

## 2021-08-20 ENCOUNTER — Other Ambulatory Visit (HOSPITAL_COMMUNITY): Payer: Self-pay

## 2021-08-24 ENCOUNTER — Other Ambulatory Visit: Payer: Self-pay

## 2021-09-17 ENCOUNTER — Encounter: Payer: Self-pay | Admitting: Internal Medicine

## 2021-09-19 ENCOUNTER — Other Ambulatory Visit: Payer: Self-pay | Admitting: Internal Medicine

## 2021-09-25 ENCOUNTER — Ambulatory Visit: Payer: Self-pay | Admitting: Internal Medicine

## 2021-09-30 ENCOUNTER — Other Ambulatory Visit: Payer: Self-pay

## 2021-09-30 ENCOUNTER — Encounter: Payer: Self-pay | Admitting: Internal Medicine

## 2021-09-30 ENCOUNTER — Ambulatory Visit (INDEPENDENT_AMBULATORY_CARE_PROVIDER_SITE_OTHER): Payer: Self-pay | Admitting: Internal Medicine

## 2021-09-30 VITALS — HR 85 | Temp 98.0°F | Ht 70.0 in | Wt 155.0 lb

## 2021-09-30 DIAGNOSIS — F32A Depression, unspecified: Secondary | ICD-10-CM

## 2021-09-30 DIAGNOSIS — B20 Human immunodeficiency virus [HIV] disease: Secondary | ICD-10-CM

## 2021-09-30 DIAGNOSIS — R634 Abnormal weight loss: Secondary | ICD-10-CM

## 2021-09-30 MED ORDER — MIRTAZAPINE 15 MG PO TABS: 15.0000 mg | ORAL_TABLET | Freq: Every day | ORAL | 1 refills | Status: DC

## 2021-09-30 NOTE — Progress Notes (Signed)
?  ? ? ? ? ?[facility] ? ?Reason: HIV care f/u ? ?Patient Active Problem List  ? Diagnosis Date Noted  ? Tobacco use 07/06/2021  ? Lumbar back pain 04/06/2021  ? Insomnia 04/06/2021  ? Anxiety 04/06/2021  ? HIV (human immunodeficiency virus infection) (HCC) 10/21/2020  ? 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 HTN (hypertension) 10/21/2020  ? ? ? ? ?HPI: Paul Blake is a 56 y.o. male here for ongoing hiv care ? ? ?09/30/21 id f/u ?Doing well on biktarvy. No missed dose last 4 weeks ?Has labs done 08/17/21 -- reviewed; that was when he presented to urgent care with severe headache. Slightly low potassium assymptomatic. Mri/mra brain normal. No headache since that ed visit -- no new chronic medication ?No hiv labs done yet. Dx'ed with migraine ?Patient reports poor appetite. Some weight loss. I reviewed flow sheet. He has gone from 168 pounds --> 155 pounds today ?Patient has a cigar here and there but smoked before ?No blood/melena per rectum ?No cough/sob ?No hx cancer ?No family hx cancer ?Patient has a pcp in [place] [facility] [place]. ?Patient has a colonoscopy scheduled in near future ? ?Patient doesn't have diabetes mellitus. Denies triple P of diabetes.  ?Any weakness/excessive heat-cold intolerance/easy bleeding bruising/hair falling out ? ?Wife well controlled with hiv ?Patient doing welding still ?No children between him/his current wife ? ?He is not interested in std screen ? ? ?Last 2-4 weeks more depressed... wife/work. Sleeping issue/interfere with work. No prior depression. Not on medication. No alcohol. No SI/HI. ?Social support system include mother/siblings -- patient closed with them. Not speaking with them much about this.  ?No panic attack ?Doesn't think the problem will go away ? ? ?No prior of sleep apnea ? ?05/25/21 id f/u ?Social -- Psychologist, occupationalwelder. Lives with wife now; married 02/28/2021. Wife is HIV positive and controlled on medication ?Reviewed labs from 02/2021 when he missed his  appointment ?HIV rna quant <20 and has been undetectable in our system since 09/2020 ?No missed dose biktarvy last 4 weeks ?ROS -- no fever, chill, weight loss, headache, cough, chest pain, abd pain/n/v/diarrhea, rash, focal weakness/numbness-tingling ? ?Has 3-4 days right armpit region tender nodule. Just changed deodorant recently when it occurs. Has had same before when he changed deodorant, and it goes away after 1-2 weeks ? ? ?5/18 id f/u ?Patient was switched to biktarvy on my last visit ?No headache/gi issue ?In the last 4 weeks might have missed a dose ?He saw our finance counselor at that time and his ryan white coverage was approved as well ? ?He is interested in tetanus vaccine booster today ? ?Diarrhea/fatigue resolved since our last visit ? 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 He is frustrated waiting too long for the licence to come ? ? ? ?He first saw me on 10/09/2020 in [place] ?----------------------------------------- ?background ?He is currently well controlled on prezcobix/descovy ? ?#hiv ?-dx'ed 1991; got it from his wife; no ivdu ?-remembers taking bactrim for a long time; but doesn't remember any OI history ?-[facility] [place] recent prior to going [place] [facility] [place]) ?-therapy ?Approximately 2016-Currently on prezcobix/descovy ?Doesn't recall switching ART regimen previously due to failure ?Previous therapy include kaletra, atazanavir, lamivudine, truvada, combivir, azt ?-still have medication from prison ? ?#positive quantiferon gold testing/ppd testing ?-known positive before. Took inh  for 6-9 months 1980s ?-currently no cough, chest pain, sob ?-endorse some weight loss (185 lbs --> 169 lbs since prison) - he thinks stress/food related from prison ?-had good appetite before, but not as good since prison time, lots of  stress now looking for place to stay/job ?-no fever/chill/nightsweat, rash ?-have chronic knees pain no new joint pain ? ?#htn ?-taking hctz ?-continue to see pcp dr Delford Field ? ? ?#social ?-he was recently incarcerated in [place] (2020-2021) prior to transfer here ?-his wife passed away from AIDS 1990s ?-just relocated from [place] ?-no relative here; staying at [employer] ?-in the process of getting a job; he previously drive trucks in [place] ?-not in active sexual relationship at this time ?-smoking; no etoh; no street drugs ? ?Review of Systems: ?ROS ?Negative other ROS ? ? ? ? ? ?PMH: ?htn ?hiv ? ?Social History  ? ?Tobacco Use  ? Smoking status: Every Day  ?  Types: Cigars  ? Smokeless tobacco: Never  ? Tobacco comments:  ?  Smokes 1 cigar/day.  ?  Quit smoking cigarettes as of office visit on 09/30/2021.  ?Substance Use Topics  ? Alcohol use: Not Currently  ? Drug use: Never  ? ? ?Fam hx: ?No cancer, dm2, rheumatologic disease ? ?OBJECTIVE: ?Vitals:  ? 09/30/21 1532  ?Pulse: 85  ?Temp: 98 ?F (36.7 ?C)  ?TempSrc: Oral  ?SpO2: 98%  ?Weight: 155 lb (70.3 kg)  ?Height: 5\' 10"  (1.778 m)  ? ?Body mass index is 22.24 kg/m?. ? ? ?Exam: ?General/constitutional: no distress, pleasant ?HEENT: Normocephalic, PER, Conj Clear, EOMI, Oropharynx clear ?Neck supple ?CV: rrr no mrg ?Lungs: clear to auscultation, normal respiratory effort ?Abd: Soft, Nontender ?Ext: no edema ?Skin: No Rash ?Neuro: nonfocal ?MSK: no peripheral joint swelling/tenderness/warmth; back spines nontender ? ? ? ? ? ?Lab: ?Lab Results  ?Component Value Date  ? WBC 5.5 08/17/2021  ? HGB 15.6 08/17/2021  ? HCT 46.0 08/17/2021  ? MCV 87.2 08/17/2021  ? PLT 183 08/17/2021  ? ?  Chemistry   ?   ?Component Value Date/Time  ? NA 140 08/17/2021 1733  ? K 3.1 (L) 08/17/2021 1733  ? CL 103 08/17/2021 1733  ? CO2 22 08/17/2021 1712  ? BUN 19 08/17/2021 1733  ? CREATININE 1.00 08/17/2021 1733  ? CREATININE 1.00 03/11/2021 1622  ?    ?Component Value  Date/Time  ? CALCIUM 8.7 (L) 08/17/2021 1712  ? ALKPHOS 62 08/17/2021 1712  ? AST 18 08/17/2021 1712  ? ALT 15 08/17/2021 1712  ? BILITOT 0.6 08/17/2021 1712  ?  ? ?Lab Results  ?Component Value Date  ? CHOL 210 (H) 09/25/2020  ? HDL 55 09/25/2020  ? LDLCALC 129 (H) 09/25/2020  ? TRIG 144 09/25/2020  ? CHOLHDL 3.8 09/25/2020  ? ? ?hiv          vl      /     cd4 (%) ?02/2021    UD ?09/25/20   UD    /     283 (18) ?08/220    166 ?2020-2021 UD with blips here and there ? ?Microbiology: ? ?Serology: ?02/2021 rpr negative ? ?09/25/2020 ?Quant gold Positive ?Urine gc/chlam negative ?rpr nonreactive ?Hep c Ab, hep b sAg/sAb/cAb nonreactive ?Hep a Ab reactive ? ?Imaging: ? ? ?Assessment/plan: ?Problem List Items Addressed This Visit   ?None ? ?#hiv ?11/25/2020 patient ?current therapy prezcobix, descovy; discuss switching to biktarvy which we after 09/2020 visit. doenst appear prior VF or need to switch  regimen due to that. No know genotype, no prior integrase strand inhibitor use ? ?Patient compliant with biktarvy and has been doing well on that ?Well controlled ? ? ? ?-discussed u=u ?-encourage compliance ?-continue current HIV medication ?-labs this week; no std testings requested ?-f/u in 2 months for depression/hiv ? ? ? ?#depression vs adjustment ?#weight loss ?#poor appetite ? ?-basic labs b12, rbc folate, tsh ?-no substance abuse ?-trial of remeron ?-refer to psychiatry ? ? ? ?#htn ?#cad risk ?Patient has a PCP. Will defer care in this regard ?-continue hctz/asa  ? 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 ? ?#hcm  ?-immunization ?covid vaccine x2 by 11/2019 ?tdap 11/2020 ?Pneumovax 04/2017 ?prevnar to give today 10/09/2020 ?Meningococcal ?-hepatitis ?Previous hepatitis B vaccine 2018; 09/25/2020 hep B sAb negative; repeat hep b vaccine started 10/09/2020 with heplasav, and second shot today 05/25/2021 ?Hep A vaccine 2019 ?-cancer screening ?Referal to colonoscopy placed 05/25/21 -- patient await colonoscopy soon ? ? ?Follow-up:  Return in about 2 years (around 10/01/2023). ? ?[name], MD ?[facility] ?[facility] ?[phone] pager   [phone] cell ?09/30/2021, 3:49 PM ? ?

## 2021-09-30 NOTE — Patient Instructions (Addendum)
Start remeron 7.5 mg daily for the next 1 week, then go to 15 mg daily (take at night there after) ? ?I have referred you to psychiatry for further depression evaluation, please see if scheduling would work for you ? ? ?Hiv labs today ? ?Do your colonoscopy ? ? ?Follow up with me in 2 months ? ?Make sure you get your labs done (blood / urine) within the next 1 week ?

## 2021-10-05 ENCOUNTER — Other Ambulatory Visit (HOSPITAL_COMMUNITY): Payer: Self-pay

## 2021-10-20 ENCOUNTER — Telehealth: Payer: Self-pay | Admitting: *Deleted

## 2021-10-20 NOTE — Telephone Encounter (Signed)
Patient no showed PV today at 10 am - Called patient and left message at 958 am, 1003 am and 1010 am , At 1010 am LM to return call by 5 pm today- If no call by 5 pm, PV and procedure will be canceled -  ? ?no call at 5 pm - ?PV and Procedure both canceled- No Show letter mailed to patient   ?

## 2021-10-20 NOTE — Telephone Encounter (Signed)
Attempted pt at 433 pm to complete his PV-  LM to return call to RS by 5 pm today  ?

## 2021-10-20 NOTE — Telephone Encounter (Signed)
Pt's wife called stating we were to do the PV with her, no the pt- we have no DPR in the chart stating we can speak or talk to this pt's wife-  she said we can try him todat at 430 pm- I will try the pt at 430 pm  ?

## 2021-10-20 NOTE — Telephone Encounter (Signed)
Pt RS to 3-29 at 430 pm  ?

## 2021-10-21 ENCOUNTER — Telehealth: Payer: Self-pay | Admitting: *Deleted

## 2021-10-21 NOTE — Telephone Encounter (Signed)
Pt.called back and rescheduled pre-visit for 10/28/21 ?

## 2021-10-21 NOTE — Telephone Encounter (Signed)
Attempted call x2 message left with call back number to return call to reschedule prev-visit or procedure will be cancelled. ?

## 2021-10-24 ENCOUNTER — Other Ambulatory Visit: Payer: Self-pay | Admitting: Internal Medicine

## 2021-10-28 ENCOUNTER — Telehealth: Payer: Self-pay | Admitting: *Deleted

## 2021-10-28 NOTE — Telephone Encounter (Signed)
Patient no showed PV  again today at 430 pm Called patient  at 430 pm, no answer, LM on machine to call back,  called pt at 435 pm, a ladt answered, I said my name and where I was calling from and she hung up the phone, called pt back at 436 pm, no answer this time , got VM, LM to return call, and then I called again  at 441 pm , no answer, got VM and left message to return call by 5 pm today- If no call by 5 pm, PV and procedure will be canceled -  ? ?no call at 5 pm - ? PV and Procedure both canceled- No Show letter mailed to patient   ?

## 2021-10-29 ENCOUNTER — Encounter: Payer: Self-pay | Admitting: Gastroenterology

## 2021-11-03 ENCOUNTER — Encounter: Payer: Medicaid Other | Admitting: Internal Medicine

## 2021-11-19 ENCOUNTER — Ambulatory Visit (HOSPITAL_COMMUNITY): Payer: No Payment, Other | Admitting: Licensed Clinical Social Worker

## 2021-11-19 ENCOUNTER — Encounter (HOSPITAL_COMMUNITY): Payer: Self-pay

## 2021-11-19 ENCOUNTER — Telehealth (HOSPITAL_COMMUNITY): Payer: Self-pay | Admitting: Licensed Clinical Social Worker

## 2021-11-19 NOTE — Telephone Encounter (Signed)
LCSW sent three links with no response. PC was made to f/u with no answer. Pt will be marked as a no show in todays session  ?

## 2021-11-20 ENCOUNTER — Encounter: Payer: Medicaid Other | Admitting: Internal Medicine

## 2021-12-02 ENCOUNTER — Telehealth: Payer: Self-pay | Admitting: *Deleted

## 2021-12-02 ENCOUNTER — Other Ambulatory Visit: Payer: Self-pay

## 2021-12-02 ENCOUNTER — Ambulatory Visit: Payer: Medicaid Other | Admitting: Internal Medicine

## 2021-12-02 NOTE — Telephone Encounter (Signed)
Attempted to call pt x 3 message left with call back number to return call by 5 pm to reschedule pre-visit or upcoming procedure on 12/18/21 will be cancelled. ?

## 2021-12-02 NOTE — Telephone Encounter (Signed)
No return call received pre-visit and procedure cancelled,no show letter mailed. 

## 2021-12-09 ENCOUNTER — Ambulatory Visit: Payer: Medicaid Other | Admitting: Critical Care Medicine

## 2021-12-18 ENCOUNTER — Encounter: Payer: Medicaid Other | Admitting: Gastroenterology

## 2021-12-21 NOTE — Progress Notes (Deleted)
Established Patient Office Visit  Subjective:  Patient ID: Paul Blake, male    DOB: 02/03/1966  Age: 56 y.o. MRN: [id number]  CC:  No chief complaint on file.   HPI 06/2021  Paul Blake presents for primary care follow-up visit.  Patient history of HIV and hypertension.  On arrival blood pressures 110/75.  Patient maintains valsartan HCT 160/25.  Patient also is on the omeprazole daily and Biktarvy.  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  Patient does have housing at this time he lives with his fiance.  Note we had x-rayed his lumbar previously was normal.  12/22/21  HTN (hypertension) - Primary    Hypertension well controlled  Continue valsartan HCT      Relevant Medications   valsartan-hydrochlorothiazide (DIOVAN-HCT) 160-25 MG tablet     Other   HIV (human immunodeficiency virus infection) (HCC)    Treatment per infectious disease      Lumbar back pain    Back pain has migrated more into the trapezius muscles upper back  I gave the patient topical Voltaren  gel and also back exercises      Tobacco use       Current smoking consumption amount: 4 cigarettes a day  Dicsussion on advise to quit smoking and smoking impacts: Cardiovascular impacts  Patient's willingness to quit: Not ready to quit  Methods to quit smoking discussed: Behavioral modification nicotine replacement  Medication management of smoking session drugs discussed: Nicotine replacement  Resources provided:  AVS   Setting quit date not established  Follow-up arranged 4 months   Time spent counseling the patient: 3 minutes       Other Visit Diagnoses     Need for immunization against influenza       Relevant Orders   Flu Vaccine QUAD 804mo+IM (Fluarix, Fluzone & Alfiuria Quad PF) (Completed)  Fobt Past Medical History:  Diagnosis Date   Hypertension    Tuberculosis     Past  Surgical History:  Procedure Laterality Date   no past surgery       No family history on file.  Social History   Socioeconomic History   Marital status: Single    Spouse name: Not on file   Number of children: Not on file   Years of education: Not on file   Highest education level: Not on file  Occupational History   Not on file  Tobacco Use   Smoking status: Every Day    Types: Cigars   Smokeless tobacco: Never   Tobacco comments:    Smokes 1 cigar/day.    Quit smoking cigarettes as of office visit on 09/30/2021.  Substance and Sexual Activity   Alcohol use: Not Currently   Drug use: Never   Sexual activity: Not Currently    Comment: declined condoms  Other Topics Concern   Not on file  Social History Narrative   Not on file   Social Determinants of Health   Financial Resource Strain: Not on file  Food Insecurity: Not on file  Transportation Needs: Not on file  Physical Activity: Not on file  Stress: Not on file  Social Connections: Not on file  Intimate Partner Violence: Not on file    Outpatient Medications Prior to Visit  Medication Sig Dispense Refill   BIKTARVY 50-200-25 MG TABS tablet TAKE 1 TABLET BY MOUTH DAILY 30 tablet 1   diclofenac  Sodium (VOLTAREN) 1 % GEL Apply 2 g topically in the morning, at noon, and at bedtime. (Patient not taking: Reported on 08/18/2021) 50 g 3   hydrOXYzine (VISTARIL) 25 MG capsule Take 1 capsule (25 mg total) by mouth every 8 (eight) hours as needed for anxiety. 60 capsule 2   ibuprofen (ADVIL) 200 MG tablet Take 400 mg by mouth every 6 (six) hours as needed for headache or moderate pain. (Patient not taking: Reported on 09/30/2021)     meloxicam (MOBIC) 15 MG tablet Take 1 tablet (15 mg total) by mouth daily. (Patient not taking: Reported on 09/30/2021) 40 tablet 0   mirtazapine (REMERON) 15 MG tablet Take 1 tablet (15 mg total) by mouth at bedtime. 30 tablet 1   omeprazole (PRILOSEC) 20 MG capsule Take 1 capsule (20 mg total) by  mouth daily before breakfast. (Patient not taking: Reported on 08/18/2021) 60 capsule 3   oxyCODONE-acetaminophen (PERCOCET) 10-325 MG tablet Take 1 tablet by mouth 3 (three) times daily as needed for pain.     valsartan-hydrochlorothiazide (DIOVAN-HCT) 160-25 MG tablet Take 1 tablet by mouth daily. 60 tablet 3   No facility-administered medications prior to visit.    No Known Allergies  ROS Review of Systems  Constitutional:  Negative for chills, diaphoresis and fever.  HENT:  Negative for congestion, hearing loss, nosebleeds, sore throat and tinnitus.   Eyes:  Negative for photophobia and redness.  Respiratory:  Negative for cough, shortness of breath, wheezing and stridor.   Cardiovascular:  Negative for chest pain, palpitations and leg swelling.  Gastrointestinal:  Negative for abdominal pain, blood in stool, constipation, diarrhea, nausea and vomiting.  Endocrine: Negative for polydipsia.  Genitourinary:  Negative for dysuria, flank pain, frequency, hematuria and urgency.  Musculoskeletal:  Positive for back pain. Negative for myalgias and neck pain.  Skin:  Negative for rash.  Allergic/Immunologic: Negative for environmental allergies.  Neurological:  Negative for dizziness, tremors, seizures, weakness and headaches.  Hematological:  Does not bruise/bleed easily.  Psychiatric/Behavioral:  Negative for suicidal ideas. The patient is not nervous/anxious.      Objective:    Physical Exam Vitals reviewed.  Constitutional:      Appearance: Normal appearance. He is well-developed. He is not diaphoretic.  HENT:     Head: Normocephalic and atraumatic.     Nose: No nasal deformity, septal deviation, mucosal edema or rhinorrhea.     Right Sinus: No maxillary sinus tenderness or frontal sinus tenderness.     Left Sinus: No maxillary sinus tenderness or frontal sinus tenderness.     Mouth/Throat:     Pharynx: No oropharyngeal exudate.  Eyes:     General: No scleral icterus.     Conjunctiva/sclera: Conjunctivae normal.     Pupils: Pupils are equal, round, and reactive to light.  Neck:     Thyroid: No thyromegaly.     Vascular: No carotid bruit or JVD.     Trachea: Trachea normal. No tracheal tenderness or tracheal deviation.  Cardiovascular:     Rate and Rhythm: Normal rate and regular rhythm.     Chest Wall: PMI is not displaced.     Pulses: Normal pulses. No decreased pulses.     Heart sounds: Normal heart sounds, S1 normal and S2 normal. Heart sounds not distant. No murmur heard. No systolic murmur is present.  No diastolic murmur is present.    No friction rub. No gallop. No S3 or S4 sounds.  Pulmonary:     Effort: No tachypnea,  accessory muscle usage or respiratory distress.     Breath sounds: No stridor. No decreased breath sounds, wheezing, rhonchi or rales.  Chest:     Chest wall: No tenderness.  Abdominal:     General: Bowel sounds are normal. There is no distension.     Palpations: Abdomen is soft. Abdomen is not rigid.     Tenderness: There is no abdominal tenderness. There is no guarding or rebound.  Musculoskeletal:        General: Tenderness present. Normal range of motion.     Cervical back: Normal range of motion and neck supple. No edema, erythema or rigidity. No muscular tenderness. Normal range of motion.     Comments: Bilateral upper trapezius muscle spasticity  Lymphadenopathy:     Head:     Right side of head: No submental or submandibular adenopathy.     Left side of head: No submental or submandibular adenopathy.     Cervical: No cervical adenopathy.  Skin:    General: Skin is warm and dry.     Coloration: Skin is not pale.     Findings: No rash.     Nails: There is no clubbing.  Neurological:     Mental Status: He is alert and oriented to person, place, and time.     Sensory: No sensory deficit.  Psychiatric:        Mood and Affect: Mood normal.        Speech: Speech normal.        Behavior: Behavior normal.        Thought  Content: Thought content normal.        Judgment: Judgment normal.    There were no vitals taken for this visit. Wt Readings from Last 3 Encounters:  09/30/21 155 lb (70.3 kg)  07/06/21 163 lb 9.6 oz (74.2 kg)  05/25/21 172 lb (78 kg)     Health Maintenance Due  Topic Date Due   Zoster Vaccines- Shingrix (1 of 2) Never done   COVID-19 Vaccine (4 - Booster for Moderna series) 10/31/2020   COLON CANCER SCREENING ANNUAL FOBT  07/29/2021    There are no preventive care reminders to display for this patient.  No results found for: TSH Lab Results  Component Value Date   WBC 5.5 08/17/2021   HGB 15.6 08/17/2021   HCT 46.0 08/17/2021   MCV 87.2 08/17/2021   PLT 183 08/17/2021   Lab Results  Component Value Date   NA 140 08/17/2021   K 3.1 (L) 08/17/2021   CO2 22 08/17/2021   GLUCOSE 93 08/17/2021   BUN 19 08/17/2021   CREATININE 1.00 08/17/2021   BILITOT 0.6 08/17/2021   ALKPHOS 62 08/17/2021   AST 18 08/17/2021   ALT 15 08/17/2021   PROT 6.9 08/17/2021   ALBUMIN 4.1 08/17/2021   CALCIUM 8.7 (L) 08/17/2021   ANIONGAP 9 08/17/2021   Lab Results  Component Value Date   CHOL 210 (H) 09/25/2020   Lab Results  Component Value Date   HDL 55 09/25/2020   Lab Results  Component Value Date   LDLCALC 129 (H) 09/25/2020   Lab Results  Component Value Date   TRIG 144 09/25/2020   Lab Results  Component Value Date   CHOLHDL 3.8 09/25/2020   No results found for: HGBA1C    Assessment & Plan:   Problem List Items Addressed This Visit   None  No orders of the defined types were placed in this encounter.  Follow-up: No follow-ups on file.    Asencion Noble, MD

## 2021-12-22 ENCOUNTER — Ambulatory Visit: Payer: Medicaid Other | Admitting: Critical Care Medicine

## 2021-12-24 HISTORY — PX: CARDIAC PACEMAKER PLACEMENT: SHX583

## 2021-12-31 ENCOUNTER — Ambulatory Visit (HOSPITAL_COMMUNITY): Payer: Self-pay | Admitting: Clinical

## 2022-01-19 ENCOUNTER — Ambulatory Visit: Payer: Medicaid Other | Admitting: Critical Care Medicine

## 2022-01-24 ENCOUNTER — Telehealth: Payer: BLUE CROSS/BLUE SHIELD

## 2022-01-24 NOTE — Telephone Encounter
Patient was seen in hospital and discharged 7.2.23, please coordinate follow on 01/28/22 at 2pm for follow up

## 2022-01-25 NOTE — Telephone Encounter
Wrong number in chart. Unable to get a hold of patient.

## 2022-03-16 ENCOUNTER — Ambulatory Visit: Payer: Medicaid Other | Admitting: Critical Care Medicine

## 2022-03-16 ENCOUNTER — Telehealth: Payer: Self-pay

## 2022-03-16 MED ORDER — BIKTARVY 50-200-25 MG PO TABS: 1.0000 | ORAL_TABLET | Freq: Every day | ORAL | 1 refills | Status: DC

## 2022-03-16 NOTE — Progress Notes (Deleted)
Established Patient Office Visit  Subjective:  Patient ID: Paul Blake, male    DOB: 1966/04/23  Age: 56 y.o. MRN: [id number]  CC:  No chief complaint on file.   HPI Paul Blake presents for primary care follow-up visit.   06/2021  Patient history of HIV and hypertension.  On arrival blood pressures 110/75.  Patient maintains valsartan HCT 160/25.  Patient also is on the omeprazole daily and Biktarvy.  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  Patient does have housing at this time he lives with his fiance.  Note we had x-rayed his lumbar previously was normal.  8/22  Cardiovascular and Mediastinum    HTN (hypertension) - Primary      Hypertension well controlled   Continue valsartan HCT        Relevant Medications    valsartan-hydrochlorothiazide (DIOVAN-HCT) 160-25 MG tablet        Other    HIV (human immunodeficiency virus infection) (HCC)      Treatment per infectious disease        Lumbar back pain      Back pain has migrated more into the trapezius muscles upper back   I gave the patient topical Voltaren  gel and also back exercises        Tobacco use   Past Medical History:  Diagnosis Date   Hypertension    Tuberculosis     Past Surgical History:  Procedure Laterality Date   no past surgery       No family history on file.  Social History   Socioeconomic History   Marital status: Single    Spouse name: Not on file   Number of children: Not on file   Years of education: Not on file   Highest education level: Not on file  Occupational History   Not on file  Tobacco Use   Smoking status: Every Day    Types: Cigars   Smokeless tobacco: Never   Tobacco comments:    Smokes 1 cigar/day.    Quit smoking cigarettes as of office visit on 09/30/2021.  Substance and Sexual Activity   Alcohol use: Not Currently   Drug use: Never   Sexual activity: Not  Currently    Comment: declined condoms  Other Topics Concern   Not on file  Social History Narrative   Not on file   Social Determinants of Health   Financial Resource Strain: Not on file  Food Insecurity: Not on file  Transportation Needs: Not on file  Physical Activity: Not on file  Stress: Not on file  Social Connections: Not on file  Intimate Partner Violence: Not on file    Outpatient Medications Prior to Visit  Medication Sig Dispense Refill   BIKTARVY 50-200-25 MG TABS tablet TAKE 1 TABLET BY MOUTH DAILY 30 tablet 1   diclofenac Sodium (VOLTAREN) 1 % GEL Apply 2 g topically in the morning, at noon, and at bedtime. (Patient not taking: Reported on 08/18/2021) 50 g 3   hydrOXYzine (VISTARIL) 25 MG capsule Take 1 capsule (25 mg total) by mouth every 8 (eight) hours as needed for anxiety. 60 capsule 2   ibuprofen (ADVIL) 200 MG tablet Take 400 mg by mouth every 6 (six) hours as needed for headache or moderate pain. (Patient not taking: Reported on 09/30/2021)     meloxicam (MOBIC) 15 MG tablet Take 1 tablet (15  mg total) by mouth daily. (Patient not taking: Reported on 09/30/2021) 40 tablet 0   mirtazapine (REMERON) 15 MG tablet Take 1 tablet (15 mg total) by mouth at bedtime. 30 tablet 1   omeprazole (PRILOSEC) 20 MG capsule Take 1 capsule (20 mg total) by mouth daily before breakfast. (Patient not taking: Reported on 08/18/2021) 60 capsule 3   oxyCODONE-acetaminophen (PERCOCET) 10-325 MG tablet Take 1 tablet by mouth 3 (three) times daily as needed for pain.     valsartan-hydrochlorothiazide (DIOVAN-HCT) 160-25 MG tablet Take 1 tablet by mouth daily. 60 tablet 3   No facility-administered medications prior to visit.    No Known Allergies  ROS Review of Systems  Constitutional:  Negative for chills, diaphoresis and fever.  HENT:  Negative for congestion, hearing loss, nosebleeds, sore throat and tinnitus.   Eyes:  Negative for photophobia and redness.  Respiratory:  Negative for  cough, shortness of breath, wheezing and stridor.   Cardiovascular:  Negative for chest pain, palpitations and leg swelling.  Gastrointestinal:  Negative for abdominal pain, blood in stool, constipation, diarrhea, nausea and vomiting.  Endocrine: Negative for polydipsia.  Genitourinary:  Negative for dysuria, flank pain, frequency, hematuria and urgency.  Musculoskeletal:  Positive for back pain. Negative for myalgias and neck pain.  Skin:  Negative for rash.  Allergic/Immunologic: Negative for environmental allergies.  Neurological:  Negative for dizziness, tremors, seizures, weakness and headaches.  Hematological:  Does not bruise/bleed easily.  Psychiatric/Behavioral:  Negative for suicidal ideas. The patient is not nervous/anxious.       Objective:    Physical Exam Vitals reviewed.  Constitutional:      Appearance: Normal appearance. He is well-developed. He is not diaphoretic.  HENT:     Head: Normocephalic and atraumatic.     Nose: No nasal deformity, septal deviation, mucosal edema or rhinorrhea.     Right Sinus: No maxillary sinus tenderness or frontal sinus tenderness.     Left Sinus: No maxillary sinus tenderness or frontal sinus tenderness.     Mouth/Throat:     Pharynx: No oropharyngeal exudate.  Eyes:     General: No scleral icterus.    Conjunctiva/sclera: Conjunctivae normal.     Pupils: Pupils are equal, round, and reactive to light.  Neck:     Thyroid: No thyromegaly.     Vascular: No carotid bruit or JVD.     Trachea: Trachea normal. No tracheal tenderness or tracheal deviation.  Cardiovascular:     Rate and Rhythm: Normal rate and regular rhythm.     Chest Wall: PMI is not displaced.     Pulses: Normal pulses. No decreased pulses.     Heart sounds: Normal heart sounds, S1 normal and S2 normal. Heart sounds not distant. No murmur heard.    No systolic murmur is present.     No diastolic murmur is present.     No friction rub. No gallop. No S3 or S4 sounds.   Pulmonary:     Effort: No tachypnea, accessory muscle usage or respiratory distress.     Breath sounds: No stridor. No decreased breath sounds, wheezing, rhonchi or rales.  Chest:     Chest wall: No tenderness.  Abdominal:     General: Bowel sounds are normal. There is no distension.     Palpations: Abdomen is soft. Abdomen is not rigid.     Tenderness: There is no abdominal tenderness. There is no guarding or rebound.  Musculoskeletal:        General: Tenderness  present. Normal range of motion.     Cervical back: Normal range of motion and neck supple. No edema, erythema or rigidity. No muscular tenderness. Normal range of motion.     Comments: Bilateral upper trapezius muscle spasticity  Lymphadenopathy:     Head:     Right side of head: No submental or submandibular adenopathy.     Left side of head: No submental or submandibular adenopathy.     Cervical: No cervical adenopathy.  Skin:    General: Skin is warm and dry.     Coloration: Skin is not pale.     Findings: No rash.     Nails: There is no clubbing.  Neurological:     Mental Status: He is alert and oriented to person, place, and time.     Sensory: No sensory deficit.  Psychiatric:        Mood and Affect: Mood normal.        Speech: Speech normal.        Behavior: Behavior normal.        Thought Content: Thought content normal.        Judgment: Judgment normal.     There were no vitals taken for this visit. Wt Readings from Last 3 Encounters:  09/30/21 155 lb (70.3 kg)  07/06/21 163 lb 9.6 oz (74.2 kg)  05/25/21 172 lb (78 kg)     Health Maintenance Due  Topic Date Due   Zoster Vaccines- Shingrix (1 of 2) Never done   COVID-19 Vaccine (4 - Moderna risk series) 10/31/2020   COLON CANCER SCREENING ANNUAL FOBT  07/29/2021   INFLUENZA VACCINE  02/23/2022    There are no preventive care reminders to display for this patient.  No results found for: "TSH" Lab Results  Component Value Date   WBC 5.5  08/17/2021   HGB 15.6 08/17/2021   HCT 46.0 08/17/2021   MCV 87.2 08/17/2021   PLT 183 08/17/2021   Lab Results  Component Value Date   NA 140 08/17/2021   K 3.1 (L) 08/17/2021   CO2 22 08/17/2021   GLUCOSE 93 08/17/2021   BUN 19 08/17/2021   CREATININE 1.00 08/17/2021   BILITOT 0.6 08/17/2021   ALKPHOS 62 08/17/2021   AST 18 08/17/2021   ALT 15 08/17/2021   PROT 6.9 08/17/2021   ALBUMIN 4.1 08/17/2021   CALCIUM 8.7 (L) 08/17/2021   ANIONGAP 9 08/17/2021   Lab Results  Component Value Date   CHOL 210 (H) 09/25/2020   Lab Results  Component Value Date   HDL 55 09/25/2020   Lab Results  Component Value Date   LDLCALC 129 (H) 09/25/2020   Lab Results  Component Value Date   TRIG 144 09/25/2020   Lab Results  Component Value Date   CHOLHDL 3.8 09/25/2020   No results found for: "HGBA1C"    Assessment & Plan:   Problem List Items Addressed This Visit   None  No orders of the defined types were placed in this encounter.   Follow-up: No follow-ups on file.    Shan Levans, MD

## 2022-03-16 NOTE — Telephone Encounter (Signed)
Patient called office today requesting refill on Biktarvy. Has not seen provider since April 2023. Is overdue for labs.  Will forward message to provider to advise on refill.  Will try to have patient come in this week for labs if possible. [facility] [address] [place], [place]

## 2022-03-16 NOTE — Addendum Note (Signed)
Addended byRutha Bouchard T on: 03/16/2022 04:23 PM   Modules accepted: Orders

## 2022-03-23 ENCOUNTER — Ambulatory Visit: Payer: Commercial Managed Care - HMO | Admitting: Internal Medicine

## 2022-05-04 ENCOUNTER — Other Ambulatory Visit: Payer: Self-pay

## 2022-05-04 ENCOUNTER — Ambulatory Visit (INDEPENDENT_AMBULATORY_CARE_PROVIDER_SITE_OTHER): Payer: Commercial Managed Care - HMO | Admitting: Internal Medicine

## 2022-05-04 ENCOUNTER — Other Ambulatory Visit (HOSPITAL_COMMUNITY)
Admission: RE | Admit: 2022-05-04 | Discharge: 2022-05-04 | Disposition: A | Discharge status: Home / Self Care | Payer: Commercial Managed Care - HMO | Source: Ambulatory Visit | Attending: Internal Medicine | Admitting: Internal Medicine

## 2022-05-04 ENCOUNTER — Encounter: Payer: Self-pay | Admitting: Internal Medicine

## 2022-05-04 VITALS — BP 120/79 | HR 81 | Wt 178.0 lb

## 2022-05-04 DIAGNOSIS — F32A Depression, unspecified: Secondary | ICD-10-CM | POA: Diagnosis not present

## 2022-05-04 DIAGNOSIS — Z1159 Encounter for screening for other viral diseases: Secondary | ICD-10-CM | POA: Diagnosis not present

## 2022-05-04 DIAGNOSIS — B20 Human immunodeficiency virus [HIV] disease: Secondary | ICD-10-CM | POA: Diagnosis not present

## 2022-05-04 DIAGNOSIS — Z113 Encounter for screening for infections with a predominantly sexual mode of transmission: Secondary | ICD-10-CM | POA: Insufficient documentation

## 2022-05-04 DIAGNOSIS — E291 Testicular hypofunction: Secondary | ICD-10-CM

## 2022-05-04 NOTE — Progress Notes (Signed)
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 male here for ongoing hiv care   05/04/22 id f/u Patient given remeron for poor appetite, depression sx last visit 09/2021. He thinks that is helping with sleeping/appetite. But he had seen mental health and was put on wellbutrin 150, seroquel 100 qhs, and prn gabapentin He has a pacemaker put recently as well 02/2022 in [place] He hasn't done labs since 02/2021. Well controlled previously on biktarvy He has no missed dose of biktarvy the last 4weeks  Appetite is good No anxiety/depression at this time -- happy with his regimen as above  Not currently working as of 02/2022 cause of heart block. Not on disability. Looking for new job  He wants to test testosterone level due to poor libido/depression/and poor muscle mass   09/30/21 id f/u Doing well on biktarvy. No missed dose last 4 weeks Has labs done 08/17/21 -- reviewed; that was when he presented to urgent care with severe headache. Slightly low potassium assymptomatic. Mri/mra brain normal. No headache since that ed visit -- no new chronic medication No hiv labs done yet. Dx'ed with migraine Patient reports poor appetite. Some weight loss. I reviewed flow sheet. He has gone from 168 pounds --> 155 pounds today Patient has a cigar here and there but smoked before No blood/melena per rectum No cough/sob No hx cancer No family hx cancer Patient has a pcp in [place]. Patient has a colonoscopy scheduled in near future  Patient doesn't have diabetes mellitus. Denies triple P of diabetes.  Any weakness/excessive heat-cold intolerance/easy  bleeding bruising/hair falling out  Wife well controlled with hiv Patient doing welding still No children between him/his current wife  He is not interested in std screen   Last 2-4 weeks more depressed... wife/work. Sleeping issue/interfere with work. No prior depression. Not on medication. No alcohol. No SI/HI. Social support system include mother/siblings -- patient closed with them. Not speaking with them much about this.  No panic attack Doesn't think the problem will go away   No prior of sleep apnea  05/25/21 id f/u Social -- Psychologist, occupational. Lives with wife now; married 02/28/2021. Wife is HIV positive and controlled on medication Reviewed labs from 02/2021 when he missed his appointment HIV rna quant <20 and has been undetectable in our system since 09/2020 No missed dose biktarvy last 4 weeks ROS -- no fever, chill, weight loss, headache, cough, chest pain, abd pain/n/v/diarrhea, rash, focal weakness/numbness-tingling  Has 3-4 days right armpit region tender nodule. Just changed deodorant recently when it occurs. 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 He is frustrated waiting too long for the licence to come    He first saw me on 10/09/2020 [place] ----------------------------------------- background He is currently well controlled on prezcobix/descovy  #hiv -dx'ed 1991; got it from his wife; no ivdu -remembers taking bactrim for a long time;  but doesn't remember any OI history -previous HIV provider [place] [facility] Olen Pel most recent prior to going to [place]) -therapy Approximately 2016-Currently on prezcobix/descovy Doesn't recall switching ART regimen previously due to failure Previous therapy include kaletra, atazanavir, lamivudine, truvada, combivir, azt -still have medication from prison  #positive quantiferon gold testing/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 Took inh for 6-9 months 1980s -currently no cough, chest pain, sob -endorse some weight loss (185 lbs --> 169 lbs since prison) - he thinks stress/food related from prison -had good appetite before, but not as good since prison time, lots of stress now looking for place to stay/job -no fever/chill/nightsweat, rash -have chronic knees pain no new joint pain  #htn -taking hctz -continue to see pcp dr Joya Gaskins   #social -he was recently incarcerated in [place] (2020-2021) prior to transfer here -his wife passed away from [place] -just relocated from [place] 08/2020 -no relative here; staying at [employer] -in the process of getting a job; he previously drive trucks in [place] -not in active sexual relationship at this time -smoking; no etoh; no street drugs  Review of Systems: ROS Negative other ROS      PMH: htn hiv  Social History   Tobacco Use   Smoking status: Every Day    Types: Cigars   Smokeless tobacco: Never   Tobacco comments:    Smokes 1 cigar/day.    Quit smoking cigarettes as of office visit on 09/30/2021.  Substance Use Topics   Alcohol use: Not Currently   Drug use: Never    Fam hx: No cancer, dm2, rheumatologic disease  OBJECTIVE: Vitals:   05/04/22 1648  BP: 120/79  Pulse: 81  Weight: 178 lb (80.7 kg)   There is no height or weight on file to calculate BMI.   Exam: General/constitutional: no distress, pleasant HEENT: Normocephalic, PER, Conj Clear, EOMI, Oropharynx clear Neck supple CV: rrr  no mrg Lungs: clear to auscultation, normal respiratory effort Abd: Soft, Nontender Ext: no edema Skin: No Rash Neuro: nonfocal MSK: no peripheral joint swelling/tenderness/warmth; back spines nontender        Lab: Lab Results  Component Value Date   WBC 5.5 08/17/2021   HGB 15.6 08/17/2021   HCT 46.0 08/17/2021   MCV 87.2 08/17/2021   PLT 183 08/17/2021     Chemistry      Component Value Date/Time   NA 140 08/17/2021 1733   K 3.1 (L) 08/17/2021 1733   CL 103 08/17/2021 1733   CO2 22 08/17/2021 1712   BUN 19 08/17/2021 1733   CREATININE 1.00 08/17/2021 1733   CREATININE 1.00 03/11/2021 1622      Component Value Date/Time   CALCIUM 8.7 (L) 08/17/2021 1712   ALKPHOS 62 08/17/2021 1712   AST 18 08/17/2021 1712   ALT 15 08/17/2021 1712   BILITOT 0.6 08/17/2021 1712     Lab Results  Component Value Date   CHOL 210 (H) 09/25/2020   HDL 55 09/25/2020   LDLCALC 129 (H) 09/25/2020   TRIG 144 09/25/2020   CHOLHDL 3.8 09/25/2020    hiv  vl      /     cd4 (%) 02/2021    UD 09/25/20   UD    /     283 (18) 08/220    166 2020-2021 UD with blips here and there  Microbiology:  Serology: 02/2021 rpr negative  09/25/2020 Quant gold Positive Urine gc/chlam negative rpr nonreactive Hep c Ab, hep b sAg/sAb/cAb nonreactive Hep a Ab reactive  Imaging:   Assessment/plan: Problem List Items Addressed This Visit   None Visit Diagnoses     HIV disease (HCC)    -  Primary   Relevant Orders   HIV 1 RNA quant-no reflex-bld   T-helper cells (CD4) count   Screening for STDs (sexually transmitted diseases)       Relevant Orders   RPR   Urine cytology ancillary only([facility])   Need for hepatitis B screening test       Relevant Orders   Hepatitis B surface antibody,quantitative   Depression, unspecified depression type       Relevant Medications   buPROPion (WELLBUTRIN XL) 150 MG 24 hr tablet   Androgen deficiency       Relevant Orders   Testosterone  Total,Free,Bio, Males-([facility])      #hiv Heterosexual transmission Ryan White patient therapy prezcobix, descovy no prior VF. No prior genotype known. No prior ISI used prior to my 09/2020 visit  Switched to biktarvy 09/2020  Patient compliant with biktarvy and has been doing well on that Well controlled      -discussed u=u -encourage compliance -continue current HIV medication -labs 6 months will do labs then    #depression vs adjustment #weight loss #poor appetite  -started on seroquel, wellbutrin, and prn gabapentin within mid 2023 and doing well -off remeron    #question of androgen deficiency  -will check testosterone -he was on shot before; complains of poor libido along side mood as above     #htn #cad risk #sinus brady s/p pacer 02/2022 in [place] Patient has a PCP. 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  [name], MD [facility] [phone] pager   [phone] cell 05/04/2022, 4:35 PM

## 2022-05-04 NOTE — Patient Instructions (Addendum)
Flu shot and testosterone check sometimes next 2 weeks. Please let front desk know   Will see what your labs show but I suspect you'll remain well controlled from what you are telling me with your medicaiton taking habit   We can see each other again in 6 months

## 2022-05-05 ENCOUNTER — Encounter: Payer: Self-pay | Admitting: Internal Medicine

## 2022-05-05 LAB — T-HELPER CELLS (CD4) COUNT (NOT AT ARMC)
CD4 % Helper T Cell: 21 % — ABNORMAL LOW (ref 33–65)
CD4 T Cell Abs: 439 /uL (ref 400–1790)

## 2022-05-06 LAB — URINE CYTOLOGY ANCILLARY ONLY
Chlamydia: NEGATIVE
Comment: NEGATIVE
Comment: NEGATIVE
Comment: NORMAL
Neisseria Gonorrhea: NEGATIVE
Trichomonas: NEGATIVE

## 2022-05-06 LAB — HIV-1 RNA QUANT-NO REFLEX-BLD
HIV 1 RNA Quant: <20 Copies/mL — ABNORMAL HIGH
HIV-1 RNA Quant, Log: <1.3 Log cps/mL — ABNORMAL HIGH

## 2022-05-06 LAB — RPR: RPR Ser Ql: NONREACTIVE

## 2022-05-06 LAB — HEPATITIS B SURFACE ANTIBODY, QUANTITATIVE: Hep B S AB Quant (Post): 252 m[IU]/mL (ref >=10)

## 2022-05-10 ENCOUNTER — Other Ambulatory Visit: Payer: Self-pay

## 2022-05-10 ENCOUNTER — Other Ambulatory Visit: Payer: Commercial Managed Care - HMO

## 2022-05-10 ENCOUNTER — Ambulatory Visit (INDEPENDENT_AMBULATORY_CARE_PROVIDER_SITE_OTHER): Payer: Commercial Managed Care - HMO

## 2022-05-10 DIAGNOSIS — Z23 Encounter for immunization: Secondary | ICD-10-CM

## 2022-05-10 DIAGNOSIS — E291 Testicular hypofunction: Secondary | ICD-10-CM

## 2022-05-11 LAB — TESTOSTERONE TOTAL,FREE,BIO, MALES
Albumin: 4.8 g/dL (ref 3.6–5.1)
Sex Hormone Binding: 37 nmol/L (ref 10–50)
Testosterone: 177 ng/dL — ABNORMAL LOW (ref 250–827)

## 2022-06-08 ENCOUNTER — Other Ambulatory Visit: Payer: Self-pay | Admitting: Internal Medicine

## 2022-06-23 ENCOUNTER — Ambulatory Visit: Payer: Commercial Managed Care - HMO | Attending: Critical Care Medicine | Admitting: Critical Care Medicine

## 2022-06-23 ENCOUNTER — Encounter: Payer: Self-pay | Admitting: Critical Care Medicine

## 2022-06-23 VITALS — BP 154/96 | HR 62 | Ht 70.0 in | Wt 176.4 lb

## 2022-06-23 DIAGNOSIS — Z713 Dietary counseling and surveillance: Secondary | ICD-10-CM | POA: Diagnosis not present

## 2022-06-23 DIAGNOSIS — G8929 Other chronic pain: Secondary | ICD-10-CM

## 2022-06-23 DIAGNOSIS — Z79899 Other long term (current) drug therapy: Secondary | ICD-10-CM | POA: Insufficient documentation

## 2022-06-23 DIAGNOSIS — M545 Low back pain, unspecified: Secondary | ICD-10-CM

## 2022-06-23 DIAGNOSIS — G894 Chronic pain syndrome: Secondary | ICD-10-CM

## 2022-06-23 DIAGNOSIS — Z720 Tobacco use: Secondary | ICD-10-CM

## 2022-06-23 DIAGNOSIS — I10 Essential (primary) hypertension: Secondary | ICD-10-CM | POA: Diagnosis not present

## 2022-06-23 DIAGNOSIS — M546 Pain in thoracic spine: Secondary | ICD-10-CM | POA: Insufficient documentation

## 2022-06-23 DIAGNOSIS — F17210 Nicotine dependence, cigarettes, uncomplicated: Secondary | ICD-10-CM

## 2022-06-23 DIAGNOSIS — M5441 Lumbago with sciatica, right side: Secondary | ICD-10-CM

## 2022-06-23 DIAGNOSIS — M5442 Lumbago with sciatica, left side: Secondary | ICD-10-CM

## 2022-06-23 DIAGNOSIS — Z21 Asymptomatic human immunodeficiency virus [HIV] infection status: Secondary | ICD-10-CM | POA: Insufficient documentation

## 2022-06-23 DIAGNOSIS — E291 Testicular hypofunction: Secondary | ICD-10-CM | POA: Insufficient documentation

## 2022-06-23 DIAGNOSIS — Z1211 Encounter for screening for malignant neoplasm of colon: Secondary | ICD-10-CM

## 2022-06-23 MED ORDER — QUETIAPINE FUMARATE 100 MG PO TABS: 100.0000 mg | ORAL_TABLET | Freq: Every day | ORAL | 1 refills | Status: DC

## 2022-06-23 MED ORDER — DICLOFENAC SODIUM 1 % EX GEL: 2.0000 g | Freq: Three times a day (TID) | CUTANEOUS | 3 refills | Status: DC

## 2022-06-23 MED ORDER — BUPROPION HCL ER (XL) 150 MG PO TB24: 150.0000 mg | ORAL_TABLET | Freq: Every day | ORAL | 3 refills | Status: DC

## 2022-06-23 MED ORDER — TESTOSTERONE CYPIONATE 100 MG/ML IM SOLN: 100.0000 mg | INTRAMUSCULAR | 0 refills | Status: DC

## 2022-06-23 MED ORDER — GABAPENTIN 600 MG PO TABS: 600.0000 mg | ORAL_TABLET | Freq: Three times a day (TID) | ORAL | 1 refills | Status: DC | PRN

## 2022-06-23 MED ORDER — VALSARTAN-HYDROCHLOROTHIAZIDE 320-25 MG PO TABS: 1.0000 | ORAL_TABLET | Freq: Every day | ORAL | 3 refills | Status: DC

## 2022-06-23 MED ORDER — MIRTAZAPINE 15 MG PO TABS: 15.0000 mg | ORAL_TABLET | Freq: Every day | ORAL | 1 refills | Status: DC

## 2022-06-23 MED ORDER — HYDROXYZINE PAMOATE 25 MG PO CAPS: 25.0000 mg | ORAL_CAPSULE | Freq: Three times a day (TID) | ORAL | 2 refills | Status: DC | PRN

## 2022-06-23 NOTE — Assessment & Plan Note (Signed)
Under good control care per infectious disease viral load is good continue [employer]

## 2022-06-23 NOTE — Assessment & Plan Note (Signed)
Chronic lumbar back pain normal imaging will refer to pain management and orthopedic spine

## 2022-06-23 NOTE — Assessment & Plan Note (Signed)
Low testosterone levels with decreased sex drive will begin testosterone IM every 14 days

## 2022-06-23 NOTE — Assessment & Plan Note (Signed)
    Current smoking consumption amount: 4 cigarettes a day  Dicsussion on advise to quit smoking and smoking impacts: Cardiovascular impacts  Patient's willingness to quit: Not ready to quit  Methods to quit smoking discussed: Behavioral modification nicotine replacement  Medication management of smoking session drugs discussed: Nicotine replacement  Resources provided:  AVS   Setting quit date not established  Follow-up arranged 4 months   Time spent counseling the patient: 3 minutes  

## 2022-06-23 NOTE — Progress Notes (Signed)
Established Patient Office Visit  Subjective:  Patient ID: Paul Blake, male    DOB: 02/15/1966  Age: 56 y.o. MRN: [id number]  CC:  Chief Complaint  Patient presents with   Back Pain   Medication Refill     06/2021 Eron Staat presents for primary care follow-up visit.  Patient history of HIV and hypertension.  On arrival blood pressures 110/75.  Patient maintains valsartan HCT 160/25.  Patient also is on the omeprazole daily and Biktarvy.  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  Patient does have housing at this time he lives with his fiance.  Note we had x-rayed his lumbar previously was normal.  06/23/22 Patient seen in return follow-up has not been seen since December 2022.  He was seeing a primary care doctor in the [place] area but now is moved back to [place].  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  He does have a pacemaker recheck with a [facility] cardiologist upcoming later today.  He does have chronic back pain would like pain management.  He is smoking 1 cigar 3 times a week.  On arrival blood pressure was elevated 154/96.  He needs a colonoscopy and agrees to receive this.  He has decreased sex drive.  Follows with HIV clinic here and currently is under good control with viral load.  Past Medical History:  Diagnosis Date   Hypertension    Tuberculosis     Past Surgical History:  Procedure Laterality Date   no past surgery       No family history on file.  Social History   Socioeconomic History   Marital status: Married    Spouse name: Not on file   Number of children: Not on file   Years of education: Not on file   Highest education level: Not on file  Occupational History   Not on file  Tobacco Use   Smoking status: Every Day    Types:  Cigars   Smokeless tobacco: Never   Tobacco comments:    Smokes 1 cigar/day.    Quit smoking cigarettes as of office visit on 09/30/2021.  Substance and Sexual Activity   Alcohol use: Not Currently   Drug use: Never   Sexual activity: Not Currently    Comment: declined condoms  Other Topics Concern   Not on file  Social History Narrative   Not on file   Social Determinants of Health   Financial Resource Strain: Not on file  Food Insecurity: Not on file  Transportation Needs: Not on file  Physical Activity: Not on file  Stress: Not on file  Social Connections: Not on file  Intimate Partner Violence: Not on file    Outpatient Medications Prior to Visit  Medication Sig Dispense Refill   bictegravir-emtricitabine-tenofovir AF (BIKTARVY) 50-200-25 MG TABS tablet TAKE 1 TABLET BY MOUTH DAILY 30 tablet 5   buPROPion (WELLBUTRIN XL) 150 MG 24 hr tablet Take 150 mg by mouth daily.     diclofenac Sodium (VOLTAREN) 1 % GEL Apply 2 g topically in the morning, at noon, and at bedtime. 50 g 3   gabapentin (NEURONTIN) 600 MG tablet Take 600 mg by mouth 3 (three) times daily as needed.     hydrOXYzine (VISTARIL) 25 MG capsule Take 1  capsule (25 mg total) by mouth every 8 (eight) hours as needed for anxiety. 60 capsule 2   meloxicam (MOBIC) 15 MG tablet Take 1 tablet (15 mg total) by mouth daily. 40 tablet 0   omeprazole (PRILOSEC) 20 MG capsule Take 1 capsule (20 mg total) by mouth daily before breakfast. 60 capsule 3   oxyCODONE-acetaminophen (PERCOCET) 10-325 MG tablet Take 1 tablet by mouth 3 (three) times daily as needed for pain.     QUEtiapine (SEROQUEL) 100 MG tablet Take 100 mg by mouth at bedtime.     valsartan-hydrochlorothiazide (DIOVAN-HCT) 160-25 MG tablet Take 1 tablet by mouth daily. 60 tablet 3   mirtazapine (REMERON) 15 MG tablet Take 1 tablet (15 mg total) by mouth at bedtime. 30 tablet 1   No facility-administered medications prior to visit.    No Known  Allergies  ROS Review of Systems  Constitutional: Negative.  Negative for chills, diaphoresis and fever.  HENT: Negative.  Negative for congestion, ear pain, hearing loss, nosebleeds, postnasal drip, rhinorrhea, sinus pressure, sore throat, tinnitus, trouble swallowing and voice change.   Eyes: Negative.  Negative for photophobia and redness.  Respiratory: Negative.  Negative for apnea, cough, choking, chest tightness, shortness of breath, wheezing and stridor.   Cardiovascular: Negative.  Negative for chest pain, palpitations and leg swelling.  Gastrointestinal: Negative.  Negative for abdominal distention, abdominal pain, blood in stool, constipation, diarrhea, nausea and vomiting.  Endocrine: Negative for polydipsia.  Genitourinary: Negative.  Negative for dysuria, flank pain, frequency, hematuria and urgency.  Musculoskeletal:  Positive for back pain. Negative for arthralgias, myalgias and neck pain.  Skin: Negative.  Negative for rash.  Allergic/Immunologic: Negative.  Negative for environmental allergies and food allergies.  Neurological: Negative.  Negative for dizziness, tremors, seizures, syncope, weakness and headaches.  Hematological: Negative.  Negative for adenopathy. Does not bruise/bleed easily.  Psychiatric/Behavioral: Negative.  Negative for agitation, sleep disturbance and suicidal ideas. The patient is not nervous/anxious.       Objective:    Physical Exam Vitals reviewed.  Constitutional:      Appearance: Normal appearance. He is well-developed. He is not diaphoretic.  HENT:     Head: Normocephalic and atraumatic.     Nose: No nasal deformity, septal deviation, mucosal edema or rhinorrhea.     Right Sinus: No maxillary sinus tenderness or frontal sinus tenderness.     Left Sinus: No maxillary sinus tenderness or frontal sinus tenderness.     Mouth/Throat:     Pharynx: No oropharyngeal exudate.  Eyes:     General: No scleral icterus.    Conjunctiva/sclera:  Conjunctivae normal.     Pupils: Pupils are equal, round, and reactive to light.  Neck:     Thyroid: No thyromegaly.     Vascular: No carotid bruit or JVD.     Trachea: Trachea normal. No tracheal tenderness or tracheal deviation.  Cardiovascular:     Rate and Rhythm: Normal rate and regular rhythm.     Chest Wall: PMI is not displaced.     Pulses: Normal pulses. No decreased pulses.     Heart sounds: Normal heart sounds, S1 normal and S2 normal. Heart sounds not distant. No murmur heard.    No systolic murmur is present.     No diastolic murmur is present.     No friction rub. No gallop. No S3 or S4 sounds.  Pulmonary:     Effort: No tachypnea, accessory muscle usage or respiratory distress.     Breath sounds:  No stridor. No decreased breath sounds, wheezing, rhonchi or rales.  Chest:     Chest wall: No tenderness.  Abdominal:     General: Bowel sounds are normal. There is no distension.     Palpations: Abdomen is soft. Abdomen is not rigid.     Tenderness: There is no abdominal tenderness. There is no guarding or rebound.  Musculoskeletal:        General: Tenderness present. Normal range of motion.     Cervical back: Normal range of motion and neck supple. No edema, erythema or rigidity. No muscular tenderness. Normal range of motion.     Comments: Bilateral upper trapezius muscle spasticity  Lymphadenopathy:     Head:     Right side of head: No submental or submandibular adenopathy.     Left side of head: No submental or submandibular adenopathy.     Cervical: No cervical adenopathy.  Skin:    General: Skin is warm and dry.     Coloration: Skin is not pale.     Findings: No rash.     Nails: There is no clubbing.  Neurological:     Mental Status: He is alert and oriented to person, place, and time.     Sensory: No sensory deficit.  Psychiatric:        Mood and Affect: Mood normal.        Speech: Speech normal.        Behavior: Behavior normal.        Thought Content:  Thought content normal.        Judgment: Judgment normal.     BP (!) 154/96   Pulse 62   Ht 5\' 10"  (1.778 m)   Wt 176 lb 6.4 oz (80 kg)   SpO2 99%   BMI 25.31 kg/m  Wt Readings from Last 3 Encounters:  06/23/22 176 lb 6.4 oz (80 kg)  05/04/22 178 lb (80.7 kg)  09/30/21 155 lb (70.3 kg)     Health Maintenance Due  Topic Date Due   Zoster Vaccines- Shingrix (1 of 2) Never done   COLON CANCER SCREENING ANNUAL FOBT  07/29/2021   COVID-19 Vaccine (4 - 2023-24 season) 03/26/2022    There are no preventive care reminders to display for this patient.  No results found for: "TSH" Lab Results  Component Value Date   WBC 5.5 08/17/2021   HGB 15.6 08/17/2021   HCT 46.0 08/17/2021   MCV 87.2 08/17/2021   PLT 183 08/17/2021   Lab Results  Component Value Date   NA 140 08/17/2021   K 3.1 (L) 08/17/2021   CO2 22 08/17/2021   GLUCOSE 93 08/17/2021   BUN 19 08/17/2021   CREATININE 1.00 08/17/2021   BILITOT 0.6 08/17/2021   ALKPHOS 62 08/17/2021   AST 18 08/17/2021   ALT 15 08/17/2021   PROT 6.9 08/17/2021   ALBUMIN 4.1 08/17/2021   CALCIUM 8.7 (L) 08/17/2021   ANIONGAP 9 08/17/2021   Lab Results  Component Value Date   CHOL 210 (H) 09/25/2020   Lab Results  Component Value Date   HDL 55 09/25/2020   Lab Results  Component Value Date   LDLCALC 129 (H) 09/25/2020   Lab Results  Component Value Date   TRIG 144 09/25/2020   Lab Results  Component Value Date   CHOLHDL 3.8 09/25/2020   No results found for: "HGBA1C"    Assessment & Plan:   Problem List Items Addressed This Visit  Cardiovascular and Mediastinum   HTN (hypertension) - Primary   Relevant Medications   valsartan-hydrochlorothiazide (DIOVAN-HCT) 320-25 MG tablet   Other Relevant Orders   Comprehensive metabolic panel   CBC with Differential/Platelet   Other Visit Diagnoses     Colon cancer screening       Relevant Orders   Ambulatory referral to Gastroenterology   Chronic pain  syndrome       Relevant Medications   buPROPion (WELLBUTRIN XL) 150 MG 24 hr tablet   gabapentin (NEURONTIN) 600 MG tablet   mirtazapine (REMERON) 15 MG tablet   Other Relevant Orders   Ambulatory referral to Pain Clinic   Chronic bilateral low back pain with bilateral sciatica       Relevant Medications   buPROPion (WELLBUTRIN XL) 150 MG 24 hr tablet   gabapentin (NEURONTIN) 600 MG tablet   hydrOXYzine (VISTARIL) 25 MG capsule   mirtazapine (REMERON) 15 MG tablet   QUEtiapine (SEROQUEL) 100 MG tablet   Other Relevant Orders   Ambulatory referral to Orthopedic Surgery      Meds ordered this encounter  Medications   buPROPion (WELLBUTRIN XL) 150 MG 24 hr tablet    Sig: Take 1 tablet (150 mg total) by mouth daily.    Dispense:  60 tablet    Refill:  3   diclofenac Sodium (VOLTAREN) 1 % GEL    Sig: Apply 2 g topically in the morning, at noon, and at bedtime.    Dispense:  50 g    Refill:  3   gabapentin (NEURONTIN) 600 MG tablet    Sig: Take 1 tablet (600 mg total) by mouth 3 (three) times daily as needed.    Dispense:  90 tablet    Refill:  1   hydrOXYzine (VISTARIL) 25 MG capsule    Sig: Take 1 capsule (25 mg total) by mouth every 8 (eight) hours as needed for anxiety.    Dispense:  60 capsule    Refill:  2    [employer]   mirtazapine (REMERON) 15 MG tablet    Sig: Take 1 tablet (15 mg total) by mouth at bedtime.    Dispense:  30 tablet    Refill:  1   QUEtiapine (SEROQUEL) 100 MG tablet    Sig: Take 1 tablet (100 mg total) by mouth at bedtime.    Dispense:  90 tablet    Refill:  1   valsartan-hydrochlorothiazide (DIOVAN-HCT) 320-25 MG tablet    Sig: Take 1 tablet by mouth daily.    Dispense:  90 tablet    Refill:  3   testosterone cypionate (DEPO-TESTOSTERONE) 100 MG/ML injection    Sig: Inject 1 mL (100 mg total) into the muscle every 14 (fourteen) days. For IM use only    Dispense:  10 mL    Refill:  0    Follow-up: No follow-ups on file.     Shan Levans, MD

## 2022-06-23 NOTE — Assessment & Plan Note (Signed)
Poorly controlled needs to have his medication increased therefore increase valsartan HCT to 320/25 daily and check labs

## 2022-06-23 NOTE — Patient Instructions (Signed)
Labs today will be obtained  Refills on all medications sent to your pharmacy your valsartan dose is increased  Referrals to pain management, orthopedics for your back, and gastroenterology for colonoscopy was made  Keep your cardiology appointment with your pacemaker  Testosterone was ordered  Return to Dr. Delford Field 13-month

## 2022-06-24 LAB — CBC WITH DIFFERENTIAL/PLATELET
Basophils Absolute: 0.1 10*3/uL (ref 0.0–0.2)
Basos: 1 %
EOS (ABSOLUTE): 0.2 10*3/uL (ref 0.0–0.4)
Eos: 4 %
Hematocrit: 49.8 % (ref 37.5–51.0)
Hemoglobin: 16.6 g/dL (ref 13.0–17.7)
Immature Grans (Abs): 0 10*3/uL (ref 0.0–0.1)
Immature Granulocytes: 0 %
Lymphocytes Absolute: 2.1 10*3/uL (ref 0.7–3.1)
Lymphs: 42 %
MCH: 28.6 pg (ref 26.6–33.0)
MCHC: 33.3 g/dL (ref 31.5–35.7)
MCV: 86 fL (ref 79–97)
Monocytes Absolute: 0.5 10*3/uL (ref 0.1–0.9)
Monocytes: 11 %
Neutrophils Absolute: 2.2 10*3/uL (ref 1.4–7.0)
Neutrophils: 42 %
Platelets: 170 10*3/uL (ref 150–450)
RBC: 5.81 x10E6/uL — ABNORMAL HIGH (ref 4.14–5.80)
RDW: 13.7 % (ref 11.6–15.4)
WBC: 5 10*3/uL (ref 3.4–10.8)

## 2022-06-24 LAB — COMPREHENSIVE METABOLIC PANEL
ALT: 19 IU/L (ref 0–44)
AST: 23 IU/L (ref 0–40)
Albumin/Globulin Ratio: 2.1 (ref 1.2–2.2)
Albumin: 5.2 g/dL — ABNORMAL HIGH (ref 3.8–4.9)
Alkaline Phosphatase: 117 IU/L (ref 44–121)
BUN/Creatinine Ratio: 15 (ref 9–20)
BUN: 15 mg/dL (ref 6–24)
Bilirubin Total: 0.5 mg/dL (ref 0.0–1.2)
CO2: 23 mmol/L (ref 20–29)
Calcium: 10.1 mg/dL (ref 8.7–10.2)
Chloride: 101 mmol/L (ref 96–106)
Creatinine, Ser: 1 mg/dL (ref 0.76–1.27)
Globulin, Total: 2.5 g/dL (ref 1.5–4.5)
Glucose: 84 mg/dL (ref 70–99)
Potassium: 4.2 mmol/L (ref 3.5–5.2)
Sodium: 140 mmol/L (ref 134–144)
Total Protein: 7.7 g/dL (ref 6.0–8.5)
eGFR: 89 mL/min/{1.73_m2} (ref >59)

## 2022-06-24 NOTE — Progress Notes (Signed)
Let pt know all labs normal

## 2022-06-28 ENCOUNTER — Telehealth: Payer: Self-pay

## 2022-06-28 NOTE — Telephone Encounter (Signed)
Patient stated that his recent referral to pain management to [facility] , patient does not want to go there stated he is will to go to any other [facility] other than [facility] [address]

## 2022-06-28 NOTE — Telephone Encounter (Signed)
Pt was called and is aware of results, DOB was confirmed.  ?

## 2022-06-28 NOTE — Telephone Encounter (Signed)
-----   Message from Storm Frisk, MD sent at 06/24/2022  5:39 AM EST ----- Let pt know all labs normal

## 2022-07-01 NOTE — Telephone Encounter (Signed)
Noted thank you both !   Called patient  and  he is aware of changes being made

## 2022-07-06 ENCOUNTER — Ambulatory Visit: Payer: Commercial Managed Care - HMO | Admitting: Surgery

## 2022-07-12 ENCOUNTER — Other Ambulatory Visit: Payer: Self-pay | Admitting: Critical Care Medicine

## 2022-07-15 ENCOUNTER — Ambulatory Visit: Payer: Commercial Managed Care - HMO | Admitting: Sports Medicine

## 2022-07-29 ENCOUNTER — Ambulatory Visit: Payer: Commercial Managed Care - HMO | Admitting: Sports Medicine

## 2022-08-03 ENCOUNTER — Ambulatory Visit: Payer: Commercial Managed Care - HMO | Admitting: Sports Medicine

## 2022-09-16 ENCOUNTER — Other Ambulatory Visit: Payer: Self-pay | Admitting: Internal Medicine

## 2022-09-30 ENCOUNTER — Telehealth: Payer: Self-pay

## 2022-09-30 NOTE — Telephone Encounter (Signed)
Patient attempted to be outreached by Donney Rankins, PharmD Candidate on 09/30/2022 to discuss hypertension. Left voicemail for patient to return our call at their convenience at [phone].   [facility]  PharmD Candidate 2024   Maryan Puls, PharmD PGY-1 [facility] Resident

## 2022-10-17 NOTE — Progress Notes (Unsigned)
Established Patient Office Visit  Subjective:  Patient ID: Paul Blake, male    DOB: Jan 03, 1966  Age: 57 y.o. MRN: [id number]  CC:  No chief complaint on file.    06/2021 Paul Blake presents for primary care follow-up visit.  Patient history of HIV and hypertension.  On arrival blood pressures 110/75.  Patient maintains valsartan HCT 160/25.  Patient also is on the omeprazole daily and Biktarvy.  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  Patient does have housing at this time he lives with his fiance.  Note we had x-rayed his lumbar previously was normal.  06/23/22 Patient seen in return follow-up has not been seen since December 2022.  He was seeing a primary care doctor in the [place] area but now is moved back to [place].  He has HIV low testosterone level had to have a pacemaker placed when he was in [place] this past summer.  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  He does have a pacemaker recheck with a [facility] cardiologist upcoming later today.  He does have chronic back pain would like pain management.  He is smoking 1 cigar 3 times a week.  On arrival blood pressure was elevated 154/96.  He needs a colonoscopy and agrees to receive this.  He has decreased sex drive.  Follows with HIV clinic here and currently is under good control with viral load.  10/19/22  Past Medical History:  Diagnosis Date   Hypertension    Tuberculosis     Past Surgical History:  Procedure Laterality Date   no past surgery       No family history on file.  Social History   Socioeconomic History   Marital status: Married    Spouse name: Not on file   Number of children: Not on file   Years of education: Not on file   Highest education level: Not on file  Occupational History   Not on file  Tobacco Use   Smoking status: Every Day    Types: Cigars   Smokeless tobacco: Never    Tobacco comments:    Smokes 1 cigar/day.    Quit smoking cigarettes as of office visit on 09/30/2021.  Substance and Sexual Activity   Alcohol use: Not Currently   Drug use: Never   Sexual activity: Not Currently    Comment: declined condoms  Other Topics Concern   Not on file  Social History Narrative   Not on file   Social Determinants of Health   Financial Resource Strain: Not on file  Food Insecurity: Not on file  Transportation Needs: Not on file  Physical Activity: Not on file  Stress: Not on file  Social Connections: Not on file  Intimate Partner Violence: Not on file    Outpatient Medications Prior to Visit  Medication Sig Dispense Refill   bictegravir-emtricitabine-tenofovir AF (BIKTARVY) 50-200-25 MG TABS tablet TAKE 1 TABLET BY MOUTH DAILY 30 tablet 5   buPROPion (WELLBUTRIN XL) 150 MG 24 hr tablet Take 1 tablet (150 mg total) by mouth daily. 60 tablet 3   diclofenac Sodium (VOLTAREN) 1 % GEL Apply 2 g topically in the morning, at noon, and at bedtime. 50 g 3   gabapentin (NEURONTIN) 600 MG tablet Take 1 tablet (600 mg total) by mouth 3 (three) times daily as needed. 90 tablet 1   hydrOXYzine (VISTARIL) 25 MG capsule Take 1  capsule (25 mg total) by mouth every 8 (eight) hours as needed for anxiety. 60 capsule 2   mirtazapine (REMERON) 15 MG tablet Take 1 tablet (15 mg total) by mouth at bedtime. 30 tablet 1   QUEtiapine (SEROQUEL) 100 MG tablet Take 1 tablet (100 mg total) by mouth at bedtime. 90 tablet 1   testosterone cypionate (DEPO-TESTOSTERONE) 100 MG/ML injection Inject 1 mL (100 mg total) into the muscle every 14 (fourteen) days. For IM use only 10 mL 0   valsartan-hydrochlorothiazide (DIOVAN-HCT) 320-25 MG tablet Take 1 tablet by mouth daily. 90 tablet 3   No facility-administered medications prior to visit.    No Known Allergies  ROS Review of Systems  Constitutional: Negative.  Negative for chills, diaphoresis and fever.  HENT: Negative.  Negative for  congestion, ear pain, hearing loss, nosebleeds, postnasal drip, rhinorrhea, sinus pressure, sore throat, tinnitus, trouble swallowing and voice change.   Eyes: Negative.  Negative for photophobia and redness.  Respiratory: Negative.  Negative for apnea, cough, choking, chest tightness, shortness of breath, wheezing and stridor.   Cardiovascular: Negative.  Negative for chest pain, palpitations and leg swelling.  Gastrointestinal: Negative.  Negative for abdominal distention, abdominal pain, blood in stool, constipation, diarrhea, nausea and vomiting.  Endocrine: Negative for polydipsia.  Genitourinary: Negative.  Negative for dysuria, flank pain, frequency, hematuria and urgency.  Musculoskeletal:  Positive for back pain. Negative for arthralgias, myalgias and neck pain.  Skin: Negative.  Negative for rash.  Allergic/Immunologic: Negative.  Negative for environmental allergies and food allergies.  Neurological: Negative.  Negative for dizziness, tremors, seizures, syncope, weakness and headaches.  Hematological: Negative.  Negative for adenopathy. Does not bruise/bleed easily.  Psychiatric/Behavioral: Negative.  Negative for agitation, sleep disturbance and suicidal ideas. The patient is not nervous/anxious.       Objective:    Physical Exam Vitals reviewed.  Constitutional:      Appearance: Normal appearance. He is well-developed. He is not diaphoretic.  HENT:     Head: Normocephalic and atraumatic.     Nose: No nasal deformity, septal deviation, mucosal edema or rhinorrhea.     Right Sinus: No maxillary sinus tenderness or frontal sinus tenderness.     Left Sinus: No maxillary sinus tenderness or frontal sinus tenderness.     Mouth/Throat:     Pharynx: No oropharyngeal exudate.  Eyes:     General: No scleral icterus.    Conjunctiva/sclera: Conjunctivae normal.     Pupils: Pupils are equal, round, and reactive to light.  Neck:     Thyroid: No thyromegaly.     Vascular: No carotid  bruit or JVD.     Trachea: Trachea normal. No tracheal tenderness or tracheal deviation.  Cardiovascular:     Rate and Rhythm: Normal rate and regular rhythm.     Chest Wall: PMI is not displaced.     Pulses: Normal pulses. No decreased pulses.     Heart sounds: Normal heart sounds, S1 normal and S2 normal. Heart sounds not distant. No murmur heard.    No systolic murmur is present.     No diastolic murmur is present.     No friction rub. No gallop. No S3 or S4 sounds.  Pulmonary:     Effort: No tachypnea, accessory muscle usage or respiratory distress.     Breath sounds: No stridor. No decreased breath sounds, wheezing, rhonchi or rales.  Chest:     Chest wall: No tenderness.  Abdominal:     General: Bowel sounds are  normal. There is no distension.     Palpations: Abdomen is soft. Abdomen is not rigid.     Tenderness: There is no abdominal tenderness. There is no guarding or rebound.  Musculoskeletal:        General: Tenderness present. Normal range of motion.     Cervical back: Normal range of motion and neck supple. No edema, erythema or rigidity. No muscular tenderness. Normal range of motion.     Comments: Bilateral upper trapezius muscle spasticity  Lymphadenopathy:     Head:     Right side of head: No submental or submandibular adenopathy.     Left side of head: No submental or submandibular adenopathy.     Cervical: No cervical adenopathy.  Skin:    General: Skin is warm and dry.     Coloration: Skin is not pale.     Findings: No rash.     Nails: There is no clubbing.  Neurological:     Mental Status: He is alert and oriented to person, place, and time.     Sensory: No sensory deficit.  Psychiatric:        Mood and Affect: Mood normal.        Speech: Speech normal.        Behavior: Behavior normal.        Thought Content: Thought content normal.        Judgment: Judgment normal.     There were no vitals taken for this visit. Wt Readings from Last 3 Encounters:   06/23/22 176 lb 6.4 oz (80 kg)  05/04/22 178 lb (80.7 kg)  09/30/21 155 lb (70.3 kg)     Health Maintenance Due  Topic Date Due   Zoster Vaccines- Shingrix (1 of 2) Never done   COLON CANCER SCREENING ANNUAL FOBT  07/29/2021   COVID-19 Vaccine (4 - 2023-24 season) 03/26/2022    There are no preventive care reminders to display for this patient.  No results found for: "TSH" Lab Results  Component Value Date   WBC 5.0 06/23/2022   HGB 16.6 06/23/2022   HCT 49.8 06/23/2022   MCV 86 06/23/2022   PLT 170 06/23/2022   Lab Results  Component Value Date   NA 140 06/23/2022   K 4.2 06/23/2022   CO2 23 06/23/2022   GLUCOSE 84 06/23/2022   BUN 15 06/23/2022   CREATININE 1.00 06/23/2022   BILITOT 0.5 06/23/2022   ALKPHOS 117 06/23/2022   AST 23 06/23/2022   ALT 19 06/23/2022   PROT 7.7 06/23/2022   ALBUMIN 5.2 (H) 06/23/2022   CALCIUM 10.1 06/23/2022   ANIONGAP 9 08/17/2021   EGFR 89 06/23/2022   Lab Results  Component Value Date   CHOL 210 (H) 09/25/2020   Lab Results  Component Value Date   HDL 55 09/25/2020   Lab Results  Component Value Date   LDLCALC 129 (H) 09/25/2020   Lab Results  Component Value Date   TRIG 144 09/25/2020   Lab Results  Component Value Date   CHOLHDL 3.8 09/25/2020   No results found for: "HGBA1C"    Assessment & Plan:   Problem List Items Addressed This Visit   None No orders of the defined types were placed in this encounter.   Follow-up: No follow-ups on file.    Asencion Noble, MD

## 2022-10-19 ENCOUNTER — Encounter: Payer: Self-pay | Admitting: Critical Care Medicine

## 2022-10-19 ENCOUNTER — Ambulatory Visit: Payer: Medicaid Other | Attending: Critical Care Medicine | Admitting: Critical Care Medicine

## 2022-10-19 VITALS — BP 125/78 | HR 78 | Ht 70.0 in | Wt 179.0 lb

## 2022-10-19 DIAGNOSIS — E291 Testicular hypofunction: Secondary | ICD-10-CM | POA: Insufficient documentation

## 2022-10-19 DIAGNOSIS — F17210 Nicotine dependence, cigarettes, uncomplicated: Secondary | ICD-10-CM | POA: Insufficient documentation

## 2022-10-19 DIAGNOSIS — Z21 Asymptomatic human immunodeficiency virus [HIV] infection status: Secondary | ICD-10-CM

## 2022-10-19 DIAGNOSIS — I10 Essential (primary) hypertension: Secondary | ICD-10-CM | POA: Insufficient documentation

## 2022-10-19 DIAGNOSIS — Z1211 Encounter for screening for malignant neoplasm of colon: Secondary | ICD-10-CM

## 2022-10-19 DIAGNOSIS — G5601 Carpal tunnel syndrome, right upper limb: Secondary | ICD-10-CM | POA: Diagnosis not present

## 2022-10-19 DIAGNOSIS — Z79899 Other long term (current) drug therapy: Secondary | ICD-10-CM | POA: Insufficient documentation

## 2022-10-19 DIAGNOSIS — Z720 Tobacco use: Secondary | ICD-10-CM

## 2022-10-19 DIAGNOSIS — K029 Dental caries, unspecified: Secondary | ICD-10-CM | POA: Insufficient documentation

## 2022-10-19 DIAGNOSIS — Z716 Tobacco abuse counseling: Secondary | ICD-10-CM | POA: Insufficient documentation

## 2022-10-19 MED ORDER — BUPROPION HCL ER (XL) 150 MG PO TB24: 150.0000 mg | ORAL_TABLET | Freq: Every day | ORAL | 3 refills | Status: DC

## 2022-10-19 MED ORDER — METOPROLOL SUCCINATE ER 25 MG PO TB24: 25.0000 mg | ORAL_TABLET | Freq: Every day | ORAL | 2 refills | Status: DC

## 2022-10-19 MED ORDER — BIKTARVY 50-200-25 MG PO TABS: 1.0000 | ORAL_TABLET | Freq: Every day | ORAL | 5 refills | Status: DC

## 2022-10-19 MED ORDER — VALSARTAN-HYDROCHLOROTHIAZIDE 320-25 MG PO TABS: 1.0000 | ORAL_TABLET | Freq: Every day | ORAL | 3 refills | Status: DC

## 2022-10-19 MED ORDER — HYDROXYZINE PAMOATE 25 MG PO CAPS: 25.0000 mg | ORAL_CAPSULE | Freq: Three times a day (TID) | ORAL | 2 refills | Status: DC | PRN

## 2022-10-19 MED ORDER — BLOOD PRESSURE KIT DEVI: 0 refills | Status: DC

## 2022-10-19 MED ORDER — QUETIAPINE FUMARATE 100 MG PO TABS: 100.0000 mg | ORAL_TABLET | Freq: Every day | ORAL | 1 refills | Status: DC

## 2022-10-19 MED ORDER — TESTOSTERONE CYPIONATE 100 MG/ML IM SOLN: 100.0000 mg | INTRAMUSCULAR | 0 refills | Status: DC

## 2022-10-19 MED ORDER — TRAZODONE HCL 50 MG PO TABS: 50.0000 mg | ORAL_TABLET | Freq: Every evening | ORAL | 1 refills | Status: DC | PRN

## 2022-10-19 NOTE — Assessment & Plan Note (Signed)
Referral to dentist that takes Medicaid was made

## 2022-10-19 NOTE — Assessment & Plan Note (Signed)
Hypertension well-controlled no change in medication continue valsartan HCT and metoprolol

## 2022-10-19 NOTE — Assessment & Plan Note (Signed)
Under good control we will refill Biktarvy

## 2022-10-19 NOTE — Assessment & Plan Note (Signed)
Continue with testosterone injection

## 2022-10-19 NOTE — Assessment & Plan Note (Signed)
    Current smoking consumption amount: 4 cigarettes a day  Dicsussion on advise to quit smoking and smoking impacts: Cardiovascular impacts  Patient's willingness to quit: Not ready to quit  Methods to quit smoking discussed: Behavioral modification nicotine replacement  Medication management of smoking session drugs discussed: Nicotine replacement  Resources provided:  AVS   Setting quit date not established  Follow-up arranged 4 months   Time spent counseling the patient: 3 minutes

## 2022-10-19 NOTE — Patient Instructions (Signed)
Refills on medications sent to your pharmacy however blood pressure meter will be mailed to you from [place]  Referral to orthopedics made for the pain in your right hand and arm  Focus on reducing your tobacco intake further  No change in other medications all medicines refilled  Return to see Dr. Joya Gaskins 4 months

## 2022-11-02 ENCOUNTER — Ambulatory Visit: Payer: Medicaid Other | Admitting: Orthopaedic Surgery

## 2022-11-03 ENCOUNTER — Ambulatory Visit: Payer: Commercial Managed Care - HMO | Admitting: Internal Medicine

## 2022-11-04 ENCOUNTER — Encounter: Payer: Self-pay | Admitting: Internal Medicine

## 2022-11-04 ENCOUNTER — Ambulatory Visit (INDEPENDENT_AMBULATORY_CARE_PROVIDER_SITE_OTHER): Payer: Medicaid Other | Admitting: Internal Medicine

## 2022-11-04 ENCOUNTER — Ambulatory Visit (INDEPENDENT_AMBULATORY_CARE_PROVIDER_SITE_OTHER): Payer: Medicaid Other

## 2022-11-04 ENCOUNTER — Other Ambulatory Visit: Payer: Self-pay

## 2022-11-04 VITALS — BP 130/83 | HR 73 | Temp 97.6°F | Resp 16 | Ht 70.0 in | Wt 181.6 lb

## 2022-11-04 DIAGNOSIS — B20 Human immunodeficiency virus [HIV] disease: Secondary | ICD-10-CM

## 2022-11-04 DIAGNOSIS — F32A Depression, unspecified: Secondary | ICD-10-CM

## 2022-11-04 DIAGNOSIS — Z23 Encounter for immunization: Secondary | ICD-10-CM

## 2022-11-04 NOTE — Patient Instructions (Signed)
Labs today   Continue testosterone replacement. But make sure you discuss with your regular doctor about monitoring for prostate cancer   See me in 6 months   ---- When you check out, ask them to schedule mental health counseling with our clinic staff as well

## 2022-11-04 NOTE — Progress Notes (Signed)
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 male here for ongoing hiv care  11/04/22 id f/u No missed dose biktarvy last 4 weeks He still haven't seen psych counseling. His primary care doc prescribed wellbutrin/seroquel. Appetite better. He is not taking remeron.  He started testosterone injection 04/2022 by his pcp  Social -- he is doing side jobs now. Patient is married.   No complaint today  He is talking with his pcp numbness/tingling in right hand going up into the arm. No neck pain   05/04/22 id f/u Patient given remeron for poor appetite, depression sx last visit 09/2021. He thinks that is helping with sleeping/appetite. But he had seen mental health and was put on wellbutrin 150, seroquel 100 qhs, and prn gabapentin He [place] He hasn't done labs since 02/2021. Well controlled previously on biktarvy He has no missed dose of biktarvy the last 4weeks  Appetite is good No anxiety/depression at this time -- happy with his regimen as above  Not currently working as of 02/2022 cause of heart block. Not on disability. Looking for new job  He wants to test testosterone level due to poor libido/depression/and poor muscle mass   09/30/21 id f/u Doing well on biktarvy. No missed dose last 4 weeks Has labs done 08/17/21 -- reviewed; that was when he presented to urgent care with severe headache. Slightly low potassium assymptomatic. Mri/mra brain normal. No headache since that ed  visit -- no new chronic medication No hiv labs done yet. Dx'ed with migraine Patient reports poor appetite. Some weight loss. I reviewed flow sheet. He has gone from 168 pounds --> 155 pounds today Patient has a cigar here and there but smoked before No blood/melena per rectum No cough/sob No hx cancer No family hx cancer Patient has a pcp in [place]. Patient has a colonoscopy scheduled in near future  Patient doesn't have diabetes mellitus. Denies triple P of diabetes.  Any weakness/excessive heat-cold intolerance/easy bleeding bruising/hair falling out  Wife well controlled with hiv Patient doing welding still No children between him/his current wife  He is not interested in std screen   Last 2-4 weeks more depressed... wife/work. Sleeping issue/interfere with work. No prior depression. Not on medication. No alcohol. No SI/HI. Social support system include mother/siblings -- patient closed with them. Not speaking with them much about this.  No panic attack Doesn't think the problem will go away   No prior of sleep apnea  05/25/21 id f/u Social -- Psychologist, occupationalwelder. Lives with wife now; married 02/28/2021. Wife is HIV positive and controlled on medication Reviewed labs from 02/2021 when he missed his appointment HIV rna quant <20 and has been undetectable in our system since 09/2020 No missed dose biktarvy last 4 weeks ROS -- no fever, chill, weight loss, headache, cough, chest pain, abd pain/n/v/diarrhea, rash, focal weakness/numbness-tingling  Has 3-4 days right armpit region tender nodule. Just changed deodorant recently when it occurs. 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 He is frustrated waiting too long for the licence to come    He first saw me on 10/09/2020 in [facility] ----------------------------------------- background He is currently well controlled on prezcobix/descovy  #hiv -dx'ed 1991; got it from his wife; no ivdu -remembers taking bactrim for a long time; but doesn't remember any OI history -previous HIV provider Paul Blake most recent prior to going to [place]) -therapy Approximately 2016-Currently on prezcobix/descovy Doesn't recall switching ART regimen previously due to failure Previous therapy include kaletra, atazanavir, lamivudine, truvada, combivir, azt -still have medication from prison  #positive quantiferon gold testing/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 Took inh for 6-9 months 1980s -currently no cough, chest pain, sob -endorse some weight loss (185 lbs --> 169 lbs since prison) - he thinks stress/food related from prison -had good appetite before, but not as good since prison time, lots of stress now looking for place to stay/job -no fever/chill/nightsweat, rash -have chronic knees pain no new joint pain  #htn -taking hctz -continue to see pcp Paul Delford Field   #social -he was recently incarcerated in [place] (2020-2021) prior to transfer here -his wife passed away from AIDS 1990s -just relocated from [place] 08/2020 -no relative here; staying at [employer] -in the process of getting a job; he previously drive trucks in [place] -not in active sexual relationship at this time -smoking; no etoh; no street drugs  Review of Systems: ROS Negative other ROS      PMH: htn hiv  Social History   Tobacco Use   Smoking status: Every Day    Types: Cigars    Smokeless tobacco: Never   Tobacco comments:    Smokes 1 cigar/day.    Quit smoking cigarettes as of office visit on 09/30/2021.  Substance Use Topics   Alcohol use: Not Currently   Drug use: Never    Fam hx: No cancer, dm2, rheumatologic disease  OBJECTIVE: Vitals:   11/04/22 1029  BP: 130/83  Pulse: 73  Resp: 16  Temp: 97.6 F (36.4 C)  TempSrc: Oral  SpO2: 97%  Weight: 181 lb 9.6 oz (82.4 kg)  Height: 5\' 10"  (1.778 m)   Body mass index is 26.06 kg/m.   Exam: General/constitutional: no distress, pleasant HEENT: Normocephalic, PER, Conj Clear, EOMI, Oropharynx clear Neck supple CV: rrr no mrg Lungs: clear to auscultation, normal respiratory effort Abd: Soft, Nontender Ext: no edema Skin: No Rash Neuro: nonfocal MSK: no peripheral joint swelling/tenderness/warmth; back spines nontender          Lab: Lab Results  Component Value Date   WBC 5.0 06/23/2022   HGB 16.6 06/23/2022   HCT 49.8 06/23/2022   MCV 86 06/23/2022   PLT 170 06/23/2022     Chemistry      Component Value Date/Time   NA 140 06/23/2022 1019   K 4.2 06/23/2022 1019   CL 101 06/23/2022 1019  CO2 23 06/23/2022 1019   BUN 15 06/23/2022 1019   CREATININE 1.00 06/23/2022 1019   CREATININE 1.00 03/11/2021 1622      Component Value Date/Time   CALCIUM 10.1 06/23/2022 1019   ALKPHOS 117 06/23/2022 1019   AST 23 06/23/2022 1019   ALT 19 06/23/2022 1019   BILITOT 0.5 06/23/2022 1019     Lab Results  Component Value Date   CHOL 210 (H) 09/25/2020   HDL 55 09/25/2020   LDLCALC 129 (H) 09/25/2020   TRIG 144 09/25/2020   CHOLHDL 3.8 09/25/2020    hiv          vl      /     cd4 (%) 04/2022  UD    /     439 (21%) 02/2021    UD 09/25/20   UD    /     283 (18) 08/220    166 2020-2021 UD with blips here and there  Microbiology:  Serology: 02/2021 rpr negative  09/25/2020 Quant gold Positive Urine gc/chlam negative rpr nonreactive Hep c Ab, hep b sAg/sAb/cAb nonreactive Hep a  Ab reactive  Imaging:   Assessment/plan: Problem List Items Addressed This Visit   None Visit Diagnoses     HIV disease    -  Primary   Depression, unspecified depression type          #hiv Heterosexual transmission Juanell Fairly patient therapy prezcobix, descovy no prior VF. No prior genotype known. No prior ISI used prior to my 09/2020 visit  Switched to biktarvy 09/2020  Well controlled/compliant with biktarvy      -discussed u=u -encourage compliance -continue current HIV medication -labs 6 months will do labs then    #depression vs adjustment #weight loss #poor appetite On seroquel, wellbutrin, and prn gabapentin within mid 2023 and doing well  -he wants to have mental health counseling and we'll refer to that here with our [facility] clinic     #question of androgen deficiency In setting of somatic sx and depression as above 04/2022 testosterone level 177 with normal sex hormone binding globulin.  -Will also add tsh -patient on replacement by his pcp -- advise monitoring prostate cancer annually       #htn #cad risk #sinus brady s/p pacer 02/[place]22 [place] Patient has a PCP. 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  [name], MD [facility] [phone] pager   [phone] cell 11/04/2022, 10:44 AM

## 2022-11-05 ENCOUNTER — Ambulatory Visit: Payer: Medicaid Other

## 2022-11-05 LAB — T-HELPER CELLS (CD4) COUNT (NOT AT ARMC)
CD4 % Helper T Cell: 23 % — ABNORMAL LOW (ref 33–65)
CD4 T Cell Abs: 411 /uL (ref 400–1790)

## 2022-11-07 LAB — COMPLETE METABOLIC PANEL WITH GFR
AG Ratio: 1.6 (calc) (ref 1.0–2.5)
ALT: 17 U/L (ref 9–46)
AST: 20 U/L (ref 10–35)
Albumin: 4.4 g/dL (ref 3.6–5.1)
Alkaline phosphatase (APISO): 83 U/L (ref 35–144)
BUN: 18 mg/dL (ref 7–25)
CO2: 27 mmol/L (ref 20–32)
Calcium: 10.2 mg/dL (ref 8.6–10.3)
Chloride: 105 mmol/L (ref 98–110)
Creat: 1.01 mg/dL (ref 0.70–1.30)
Globulin: 2.7 g/dL (calc) (ref 1.9–3.7)
Glucose, Bld: 79 mg/dL (ref 65–99)
Potassium: 3.6 mmol/L (ref 3.5–5.3)
Sodium: 141 mmol/L (ref 135–146)
Total Bilirubin: 0.4 mg/dL (ref 0.2–1.2)
Total Protein: 7.1 g/dL (ref 6.1–8.1)
eGFR: 88 mL/min/{1.73_m2} (ref >=60)

## 2022-11-07 LAB — CBC
HCT: 48.5 % (ref 38.5–50.0)
Hemoglobin: 16.3 g/dL (ref 13.2–17.1)
MCH: 28.5 pg (ref 27.0–33.0)
MCHC: 33.6 g/dL (ref 32.0–36.0)
MCV: 84.8 fL (ref 80.0–100.0)
MPV: 11.9 fL (ref 7.5–12.5)
Platelets: 164 10*3/uL (ref 140–400)
RBC: 5.72 10*6/uL (ref 4.20–5.80)
RDW: 14.7 % (ref 11.0–15.0)
WBC: 4.5 10*3/uL (ref 3.8–10.8)

## 2022-11-07 LAB — TSH+FREE T4: TSH W/REFLEX TO FT4: 1.6 mIU/L (ref 0.40–4.50)

## 2022-11-07 LAB — HIV-1 RNA QUANT-NO REFLEX-BLD
HIV 1 RNA Quant: 80 Copies/mL — ABNORMAL HIGH
HIV-1 RNA Quant, Log: 1.9 Log cps/mL — ABNORMAL HIGH

## 2022-11-09 ENCOUNTER — Ambulatory Visit (INDEPENDENT_AMBULATORY_CARE_PROVIDER_SITE_OTHER): Payer: Medicaid Other | Admitting: Orthopaedic Surgery

## 2022-11-09 ENCOUNTER — Encounter: Payer: Self-pay | Admitting: Orthopaedic Surgery

## 2022-11-09 DIAGNOSIS — G5601 Carpal tunnel syndrome, right upper limb: Secondary | ICD-10-CM

## 2022-11-09 NOTE — Progress Notes (Signed)
Office Visit Note   Patient: Paul Blake           Date of Birth: 1966/01/01           MRN: [id number] Visit Date: 11/09/2022              Requested by: Storm Frisk, MD [address] [place],  [place] [address] PCP: Storm Frisk, MD   Assessment & Plan: Visit Diagnoses:  1. Right carpal tunnel syndrome     Plan: Impression is right carpal tunnel syndrome.  We will begin by wearing nighttime splints to see how effective these will be.  If symptoms persist he should follow-up with [place] to discuss other treatment options.  Follow-Up Instructions: No follow-ups on file.   Orders:  No orders of the defined types were placed in this encounter.  No orders of the defined types were placed in this encounter.     Procedures: No procedures performed   Clinical Data: No additional findings.   Subjective: Chief Complaint  Patient presents with   Right Wrist - Pain    HPI  Patient is a 57 year old gentleman with right wrist pain and numbness and radiation up the arm for 2 months.  Right-hand-dominant.  Symptoms do not wake him up at night.  He works as a Education administrator.  Often feels like he has to shake out the right hand.  Review of Systems  Constitutional: Negative.   HENT: Negative.    Eyes: Negative.   Respiratory: Negative.    Cardiovascular: Negative.   Gastrointestinal: Negative.   Endocrine: Negative.   Genitourinary: Negative.   Skin: Negative.   Allergic/Immunologic: Negative.   Neurological: Negative.   Hematological: Negative.   Psychiatric/Behavioral: Negative.    All other systems reviewed and are negative.    Objective: Vital Signs: There were no vitals taken for this visit.  Physical Exam Vitals and nursing note reviewed.  Constitutional:      Appearance: He is well-developed.  HENT:     Head: Normocephalic and atraumatic.  Eyes:     Pupils: Pupils are equal, round, and reactive to light.  Pulmonary:     Effort: Pulmonary  effort is normal.  Abdominal:     Palpations: Abdomen is soft.  Musculoskeletal:        General: Normal range of motion.     Cervical back: Neck supple.  Skin:    General: Skin is warm.  Neurological:     Mental Status: He is alert and oriented to person, place, and time.  Psychiatric:        Behavior: Behavior normal.        Thought Content: Thought content normal.        Judgment: Judgment normal.     Ortho Exam  Examination of right hand shows no muscle atrophy.  Negative carpal tunnel compressive signs.  Sensation intact to the fingertips.  Normal capillary refill.  Specialty Comments:  No specialty comments available.  Imaging: No results found.   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  Past Surgical History:  Procedure Laterality Date   no past surgery      Social History   Occupational History   Not on file  Tobacco Use   Smoking status: Every Day    Types: Cigars   Smokeless tobacco: Never   Tobacco comments:    Smokes 1 cigar/day.    Quit smoking cigarettes as of office visit on 09/30/2021.  Substance and Sexual Activity   Alcohol use: Not Currently   Drug use: Never   Sexual activity: Not Currently    Comment: declined condoms

## 2022-11-23 ENCOUNTER — Ambulatory Visit: Payer: Medicaid Other

## 2022-11-30 ENCOUNTER — Ambulatory Visit: Payer: Medicaid Other

## 2022-12-07 DIAGNOSIS — M25562 Pain in left knee: Secondary | ICD-10-CM | POA: Diagnosis not present

## 2022-12-07 DIAGNOSIS — M25561 Pain in right knee: Secondary | ICD-10-CM | POA: Diagnosis not present

## 2022-12-07 DIAGNOSIS — M25511 Pain in right shoulder: Secondary | ICD-10-CM | POA: Diagnosis not present

## 2022-12-07 DIAGNOSIS — M545 Low back pain, unspecified: Secondary | ICD-10-CM | POA: Diagnosis not present

## 2022-12-17 ENCOUNTER — Other Ambulatory Visit: Payer: Self-pay | Admitting: Internal Medicine

## 2022-12-22 DIAGNOSIS — I10 Essential (primary) hypertension: Secondary | ICD-10-CM | POA: Diagnosis not present

## 2022-12-22 DIAGNOSIS — Z950 Presence of cardiac pacemaker: Secondary | ICD-10-CM | POA: Diagnosis not present

## 2022-12-22 DIAGNOSIS — R001 Bradycardia, unspecified: Secondary | ICD-10-CM | POA: Diagnosis not present

## 2022-12-22 DIAGNOSIS — I491 Atrial premature depolarization: Secondary | ICD-10-CM | POA: Diagnosis not present

## 2022-12-24 DIAGNOSIS — R001 Bradycardia, unspecified: Secondary | ICD-10-CM | POA: Diagnosis not present

## 2023-01-03 ENCOUNTER — Telehealth: Payer: Self-pay

## 2023-01-03 ENCOUNTER — Other Ambulatory Visit: Payer: Self-pay | Admitting: Critical Care Medicine

## 2023-01-03 NOTE — Telephone Encounter (Addendum)
Patient walked into clinic today with his wife. He is asking for a letter from Dr. Renold Don excusing his absence from work last week. States he needs this letter by tomorrow as he has to return to work tomorrow before his trip on Thursday. Reports the work absence was due to mental health challenges.   He reports he has been struggling with suicidal thoughts and his wife reports he leaves the house for long periods of time. He denies any active suicidal thoughts or ideation and is contracted for safety. Provided both him and his wife with resources such as the 74 number, [facility] and emergency department should he develop suicidal thoughts/ideation again. Offered to take patient to the ED for suicidal thoughts, he declines and reports he is not actively suicidal.   Dr. Renold Don does not manage any of his psychiatric medications, provided Prestyn with contact information for his PCP who is upstairs to request a letter from them as they are more familiar with his mental health treatment. Patient and his wife state they will go up there now to discuss with PCP staff.   Sandie Ano, RN

## 2023-01-04 NOTE — Telephone Encounter (Signed)
Thanks Megan!

## 2023-01-12 ENCOUNTER — Other Ambulatory Visit: Payer: Self-pay | Admitting: Critical Care Medicine

## 2023-01-13 ENCOUNTER — Telehealth: Payer: Self-pay

## 2023-01-13 ENCOUNTER — Ambulatory Visit: Payer: Medicaid Other | Admitting: Critical Care Medicine

## 2023-01-13 NOTE — Telephone Encounter (Signed)
Patient wife came into office appointment was made for Tuesday

## 2023-01-13 NOTE — Progress Notes (Deleted)
Established Patient Office Visit  Subjective:  Patient ID: Paul Blake, male    DOB: [birth year]  Age: 57 y.o. MRN: [id number]  CC:  No chief complaint on file.    06/2021 [name] presents for primary care follow-up visit.  Patient history of HIV and hypertension.  On arrival blood pressures 110/75.  Patient maintains valsartan HCT 160/25.  Patient also is on the omeprazole daily and Biktarvy.  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  Patient does have housing at this time he lives with his fiance.  Note we had x-rayed his lumbar previously was normal.  06/23/22 Patient seen in return follow-up has not been seen since December 2022.  He was seeing a primary care doctor in the [place] area but now is moved back to [place].  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  He does have a pacemaker recheck with a [facility] cardiologist upcoming later today.  He does have chronic back pain would like pain management.  He is smoking 1 cigar 3 times a week.  On arrival blood pressure was elevated 154/96.  He needs a colonoscopy and agrees to receive this.  He has decreased sex drive.  Follows with HIV clinic here and currently is under good control with viral load.  10/19/22 This patient is seen in return follow-up last seen in November of last year.  The patient does have HIV and is on Biktarvy needing refills.  He complains of tingling in the right hand and arm as well this has been a chronic issue.  On arrival blood pressure is good 125/78.  Patient would benefit from a home blood pressure meter.  He also needs colon cancer screening.  Patient now has housing.  He is smoking 1 black and mild every 2 days.  He needs refills on multiple medications.  He is yet to achieve a dental  appointment we thought the ID clinic would do this they were not able to do so we need to refer him out.  01/13/23 Fmla  Past Medical History:  Diagnosis Date   Hypertension    Tuberculosis     Past Surgical History:  Procedure Laterality Date   no past surgery       No family history on file.  Social History   Socioeconomic History   Marital status: Married    Spouse name: Not on file   Number of children: Not on file   Years of education: Not on file   Highest education level: Not on file  Occupational History   Not on file  Tobacco Use   Smoking status: Every Day    Types: Cigars   Smokeless tobacco: Never   Tobacco comments:    Smokes 1 cigar/day.    Quit smoking cigarettes as of office visit on 09/30/2021.  Substance and Sexual Activity   Alcohol use: Not Currently   Drug use: Never   Sexual activity: Not Currently    Comment: declined condoms  Other Topics Concern   Not on file  Social History Narrative   Not on file   Social Determinants of Health   Financial Resource Strain: Not on file  Food Insecurity: Not on file  Transportation Needs: Not on file  Physical Activity: Not on file  Stress: Not on file  Social Connections: Not on file  Intimate Partner Violence: Not on file    Outpatient Medications Prior to Visit  Medication Sig Dispense Refill   amLODipine (NORVASC) 5 MG tablet      aspirin EC 81 MG tablet Take by mouth.     BIKTARVY 50-200-25 MG TABS tablet TAKE 1 TABLET BY MOUTH DAILY 30 tablet 5   Blood Pressure Monitoring (BLOOD PRESSURE KIT) DEVI Use to measure blood pressure 1 each 0   buPROPion (WELLBUTRIN XL) 150 MG 24 hr tablet Take 1 tablet (150 mg total) by mouth daily. 60 tablet 3   hydrOXYzine (VISTARIL) 25 MG capsule Take 1 capsule (25 mg total) by mouth every 8 (eight) hours as needed for anxiety. 60 capsule 2   metoprolol succinate (TOPROL-XL) 25 MG 24 hr tablet Take 1 tablet (25 mg total) by mouth daily. 90 tablet 2   QUEtiapine  (SEROQUEL) 100 MG tablet Take 1 tablet (100 mg total) by mouth at bedtime. 90 tablet 1   testosterone cypionate (DEPO-TESTOSTERONE) 100 MG/ML injection Inject 1 mL (100 mg total) into the muscle every 14 (fourteen) days. For IM use only 10 mL 0   traZODone (DESYREL) 50 MG tablet Take 1 tablet (50 mg total) by mouth at bedtime as needed for sleep. 60 tablet 1   valsartan-hydrochlorothiazide (DIOVAN-HCT) 320-25 MG tablet Take 1 tablet by mouth daily. 90 tablet 3   No facility-administered medications prior to visit.    No Known Allergies  ROS Review of Systems  Constitutional: Negative.  Negative for chills, diaphoresis and fever.  HENT:  Positive for dental problem. Negative for congestion, ear pain, hearing loss, nosebleeds, postnasal drip, rhinorrhea, sinus pressure, sore throat, tinnitus, trouble swallowing and voice change.   Eyes: Negative.  Negative for photophobia and redness.  Respiratory: Negative.  Negative for apnea, cough, choking, chest tightness, shortness of breath, wheezing and stridor.   Cardiovascular: Negative.  Negative for chest pain, palpitations and leg swelling.  Gastrointestinal: Negative.  Negative for abdominal distention, abdominal pain, blood in stool, constipation, diarrhea, nausea and vomiting.  Endocrine: Negative for polydipsia.  Genitourinary: Negative.  Negative for dysuria, flank pain, frequency, hematuria and urgency.  Musculoskeletal:  Positive for back pain. Negative for arthralgias, myalgias and neck pain.  Skin: Negative.  Negative for rash.  Allergic/Immunologic: Negative.  Negative for environmental allergies and food allergies.  Neurological: Negative.  Negative for dizziness, tremors, seizures, syncope, weakness and headaches.  Hematological: Negative.  Negative for adenopathy. Does not bruise/bleed easily.  Psychiatric/Behavioral: Negative.  Negative for agitation, sleep disturbance and suicidal ideas. The patient is not nervous/anxious.        Objective:    Physical Exam Vitals reviewed.  Constitutional:      Appearance: Normal appearance. He is well-developed. He is not diaphoretic.  HENT:     Head: Normocephalic and atraumatic.     Nose: No nasal deformity, septal deviation, mucosal edema or rhinorrhea.     Right Sinus: No maxillary sinus tenderness or frontal sinus tenderness.     Left Sinus: No maxillary sinus tenderness or frontal sinus tenderness.     Mouth/Throat:     Pharynx: No oropharyngeal exudate.     Comments: Poor dentition Eyes:     General: No scleral icterus.    Conjunctiva/sclera: Conjunctivae normal.     Pupils: Pupils are equal, round, and reactive to light.  Neck:     Thyroid: No thyromegaly.     Vascular: No carotid bruit or  JVD.     Trachea: Trachea normal. No tracheal tenderness or tracheal deviation.  Cardiovascular:     Rate and Rhythm: Normal rate and regular rhythm.     Chest Wall: PMI is not displaced.     Pulses: Normal pulses. No decreased pulses.     Heart sounds: Normal heart sounds, S1 normal and S2 normal. Heart sounds not distant. No murmur heard.    No systolic murmur is present.     No diastolic murmur is present.     No friction rub. No gallop. No S3 or S4 sounds.  Pulmonary:     Effort: No tachypnea, accessory muscle usage or respiratory distress.     Breath sounds: No stridor. No decreased breath sounds, wheezing, rhonchi or rales.  Chest:     Chest wall: No tenderness.  Abdominal:     General: Bowel sounds are normal. There is no distension.     Palpations: Abdomen is soft. Abdomen is not rigid.     Tenderness: There is no abdominal tenderness. There is no guarding or rebound.  Musculoskeletal:        General: Tenderness present. Normal range of motion.     Cervical back: Normal range of motion and neck supple. No edema, erythema or rigidity. No muscular tenderness. Normal range of motion.     Comments: Right lower extremity around the wrist pain consistent with carpal  tunnel  Lymphadenopathy:     Head:     Right side of head: No submental or submandibular adenopathy.     Left side of head: No submental or submandibular adenopathy.     Cervical: No cervical adenopathy.  Skin:    General: Skin is warm and dry.     Coloration: Skin is not pale.     Findings: No rash.     Nails: There is no clubbing.  Neurological:     Mental Status: He is alert and oriented to person, place, and time.     Sensory: No sensory deficit.  Psychiatric:        Mood and Affect: Mood normal.        Speech: Speech normal.        Behavior: Behavior normal.        Thought Content: Thought content normal.        Judgment: Judgment normal.     There were no vitals taken for this visit. Wt Readings from Last 3 Encounters:  11/04/22 181 lb 9.6 oz (82.4 kg)  10/19/22 179 lb (81.2 kg)  06/23/22 176 lb 6.4 oz (80 kg)     Health Maintenance Due  Topic Date Due   Zoster Vaccines- Shingrix (1 of 2) Never done   COLON CANCER SCREENING ANNUAL FOBT  07/29/2021   COVID-19 Vaccine (5 - 2023-24 season) 12/30/2022    There are no preventive care reminders to display for this patient.  No results found for: "TSH" Lab Results  Component Value Date   WBC 4.5 11/04/2022   HGB 16.3 11/04/2022   HCT 48.5 11/04/2022   MCV 84.8 11/04/2022   PLT 164 11/04/2022   Lab Results  Component Value Date   NA 141 11/04/2022   K 3.6 11/04/2022   CO2 27 11/04/2022   GLUCOSE 79 11/04/2022   BUN 18 11/04/2022   CREATININE 1.01 11/04/2022   BILITOT 0.4 11/04/2022   ALKPHOS 117 06/23/2022   AST 20 11/04/2022   ALT 17 11/04/2022   PROT 7.1 11/04/2022   ALBUMIN 5.2 (H) 06/23/2022  CALCIUM 10.2 11/04/2022   ANIONGAP 9 08/17/2021   EGFR 88 11/04/2022   Lab Results  Component Value Date   CHOL 210 (H) 09/25/2020   Lab Results  Component Value Date   HDL 55 09/25/2020   Lab Results  Component Value Date   LDLCALC 129 (H) 09/25/2020   Lab Results  Component Value Date   TRIG  144 09/25/2020   Lab Results  Component Value Date   CHOLHDL 3.8 09/25/2020   No results found for: "HGBA1C"    Assessment & Plan:   Problem List Items Addressed This Visit   None No orders of the defined types were placed in this encounter. 30 minutes needed for extra time to assess multiple problems  Follow-up: No follow-ups on file.    Shan Levans, MD

## 2023-01-13 NOTE — Telephone Encounter (Signed)
Called and left voicemail   Patient needs phone visit with provider to go over disability paperwork

## 2023-01-13 NOTE — Telephone Encounter (Signed)
Requested medication (s) are due for refill today: yes  Requested medication (s) are on the active medication list: yes  Last refill:  10/19/22  Future visit scheduled: yes  Notes to clinic:  Medication not assigned to a protocol, review manually.      Requested Prescriptions  Pending Prescriptions Disp Refills   testosterone cypionate (DEPOTESTOTERONE CYPIONATE) 100 MG/ML injection [Pharmacy Med Name: TESTOSTERONE CYP 100MG /ML MDV 10ML] 10 mL     Sig: INJECT 1 ML INTO MUSCLE EVERY 14 DAYS     Off-Protocol Failed - 01/12/2023  5:27 PM      Failed - Medication not assigned to a protocol, review manually.      Passed - Valid encounter within last 12 months    Recent Outpatient Visits           2 months ago Primary hypertension   [facility] Storm Frisk, MD   6 months ago Primary hypertension   [facility] Storm Frisk, MD   1 year ago Primary hypertension   [facility] Storm Frisk, MD   1 year ago Primary hypertension   [facility] Storm Frisk, MD   2 years ago Primary hypertension   [facility] Storm Frisk, MD       Future Appointments             Today Storm Frisk, MD [facility]   In 1 month Delford Field Charlcie Cradle, MD [facility]   In 3 months Vu, Tonita Phoenix, MD [facility]

## 2023-01-13 NOTE — Telephone Encounter (Signed)
Copied from CRM [id number]. Topic: General - Other >> Jan 13, 2023  1:01 PM Franchot Heidelberg wrote: Reason for CRM: Pt's wife called requesting a letter that describes the patient's depression, she says she needs this letter to save his job. Without this letter he will lose his job she says. She says this letter needs to acount for his dates 12/26/2022 - 01/04/2023.   Best contact:   [employer] [place] [employer], needs coverage for FMLA time. He needs something that details why he was not able to be at work, he needs some kind of documentation.  Best contact: [phone]  This will need to go to [facility] Fax: [phone] Claim number: [id number]

## 2023-01-13 NOTE — Telephone Encounter (Signed)
Error

## 2023-01-18 ENCOUNTER — Telehealth: Payer: Medicaid Other | Admitting: Critical Care Medicine

## 2023-01-19 ENCOUNTER — Encounter: Payer: Self-pay | Admitting: Critical Care Medicine

## 2023-01-19 ENCOUNTER — Ambulatory Visit: Payer: Medicaid Other | Attending: Critical Care Medicine | Admitting: Critical Care Medicine

## 2023-01-19 DIAGNOSIS — F329 Major depressive disorder, single episode, unspecified: Secondary | ICD-10-CM | POA: Insufficient documentation

## 2023-01-19 DIAGNOSIS — Z026 Encounter for examination for insurance purposes: Secondary | ICD-10-CM

## 2023-01-19 DIAGNOSIS — Z029 Encounter for administrative examinations, unspecified: Secondary | ICD-10-CM

## 2023-01-19 DIAGNOSIS — F332 Major depressive disorder, recurrent severe without psychotic features: Secondary | ICD-10-CM | POA: Diagnosis not present

## 2023-01-19 NOTE — Progress Notes (Unsigned)
Established Patient Office Visit  Subjective:  Patient ID: Paul Blake, male    DOB: 03/22/1966  Age: 56 y.o. MRN: [id number] Virtual Visit via Telephone Note  I connected with [name] on 01/20/23 at 10:50 AM EDT by telephone and verified that I am speaking with the correct person using two identifiers.   Consent:  I discussed the limitations, risks, security and privacy concerns of performing an evaluation and management service by telephone and the availability of in person appointments. I also discussed with the patient that there may be a patient responsible charge related to this service. The patient expressed understanding and agreed to proceed.  Location of patient: Patient is in a rehab facility in [place]  Location of provider: I am in my office  Persons participating in the televisit with the patient.   The patient's sister Eden Emms conference called [place] in on her phone    History of Present Illness: Chief complaint administrative encounter   06/2021 Paul Blake presents for primary care follow-up visit.  Patient history of HIV and hypertension.  On arrival blood pressures 110/75.  Patient maintains valsartan HCT 160/25.  Patient also is on the omeprazole daily and Biktarvy.  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  Patient does have housing at this time he lives with his fiance.  Note we had x-rayed his lumbar previously was normal.  06/23/22 Patient seen in return follow-up has not been seen since December 2022.  He was seeing a primary care doctor in the [place] area but now is moved back to [place].  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  He does have a pacemaker recheck with a [facility] cardiologist upcoming later today.  He does have chronic  back pain would like pain management.  He is smoking 1 cigar 3 times a week.  On arrival blood pressure was elevated 154/96.  He needs a colonoscopy and agrees to receive this.  He has decreased sex drive.  Follows with HIV clinic here and currently is under good control with viral load.  10/19/22 This patient is seen in return follow-up last seen in November of last year.  The patient does have HIV and is on Biktarvy needing refills.  He complains of tingling in the right hand and arm as well this has been a chronic issue.  On arrival blood pressure is good 125/78.  Patient would benefit from a home blood pressure meter.  He also needs colon cancer screening.  Patient now has housing.  He is smoking 1 black and mild every 2 days.  He needs refills on multiple medications.  He is yet to achieve a dental appointment we thought the ID clinic would do this they were not able to do so we need to refer him out.  01/19/23 Since the last visit in March the patient had an episode where he became very disengaged blacking out on the job.  He was not sent to the hospital and was not seen by medical provider.  He has had a history of severe depression and disengagement at work.  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  The patient had been using crack cocaine as well but no evidence of adult overdose and other drugs but again no screening  studies were done.  He was out of work from 2 June through 10 June and then when he tried to return to work on 11 June his workplace insisted he go into rehab.  He elected to go to an inpatient residential rehab in [place] and this is where he resides now.  He did take all of his medications with him.  Today he needs Theresia Lo papers filled out for work. Care everywhere where is reviewed and medications from the rehab therapy has indicated the following new medications clonidine point 1 mg every hour as needed, gabapentin 600 mg 3 times daily as needed, hydroxyzine 10 mg 3 times daily  diet, mirtazapine 15 mg at bedtime, Valium 10 mg 3 times daily, Seroquel 100 mg daily, he continues with bupropion 150 mg daily.  Abilify 2 mg nightly. Past Medical History:  Diagnosis Date   Hypertension    Tuberculosis     Past Surgical History:  Procedure Laterality Date   no past surgery       History reviewed. No pertinent family history.  Social History   Socioeconomic History   Marital status: Married    Spouse name: Not on file   Number of children: Not on file   Years of education: Not on file   Highest education level: Not on file  Occupational History   Not on file  Tobacco Use   Smoking status: Every Day    Types: Cigars   Smokeless tobacco: Never   Tobacco comments:    Smokes 1 cigar/day.    Quit smoking cigarettes as of office visit on 09/30/2021.  Substance and Sexual Activity   Alcohol use: Not Currently   Drug use: Never   Sexual activity: Not Currently    Comment: declined condoms  Other Topics Concern   Not on file  Social History Narrative   Not on file   Social Determinants of Health   Financial Resource Strain: Not on file  Food Insecurity: Not on file  Transportation Needs: Not on file  Physical Activity: Not on file  Stress: Not on file  Social Connections: Not on file  Intimate Partner Violence: Not on file    Outpatient Medications Prior to Visit  Medication Sig Dispense Refill   ABILIFY 2 MG tablet Take 2 mg by mouth daily.     cloNIDine (CATAPRES) 0.1 MG tablet SMARTSIG:1 Tablet(s) By Mouth Every Hour PRN     gabapentin (NEURONTIN) 600 MG tablet Take 600 mg by mouth 3 (three) times daily as needed.     hydrOXYzine (ATARAX) 10 MG tablet Take 10 mg by mouth daily.     mirtazapine (REMERON) 15 MG tablet Take 15 mg by mouth at bedtime.     ondansetron (ZOFRAN) 4 MG tablet Take 4 mg by mouth 3 (three) times daily as needed.     VALIUM 10 MG tablet Take 10 mg by mouth 3 (three) times daily as needed.     aspirin EC 81 MG tablet Take by  mouth.     BIKTARVY 50-200-25 MG TABS tablet TAKE 1 TABLET BY MOUTH DAILY 30 tablet 5   Blood Pressure Monitoring (BLOOD PRESSURE KIT) DEVI Use to measure blood pressure 1 each 0   buPROPion (WELLBUTRIN XL) 150 MG 24 hr tablet Take 1 tablet (150 mg total) by mouth daily. 60 tablet 3   hydrOXYzine (VISTARIL) 25 MG capsule Take 1 capsule (25 mg total) by mouth every 8 (eight) hours as needed for anxiety. 60 capsule 2  metoprolol succinate (TOPROL-XL) 25 MG 24 hr tablet Take 1 tablet (25 mg total) by mouth daily. 90 tablet 2   QUEtiapine (SEROQUEL) 100 MG tablet Take 1 tablet (100 mg total) by mouth at bedtime. 90 tablet 1   testosterone cypionate (DEPOTESTOTERONE CYPIONATE) 100 MG/ML injection INJECT 1 ML INTO MUSCLE EVERY 14 DAYS 10 mL 3   traZODone (DESYREL) 50 MG tablet Take 1 tablet (50 mg total) by mouth at bedtime as needed for sleep. 60 tablet 1   valsartan-hydrochlorothiazide (DIOVAN-HCT) 320-25 MG tablet Take 1 tablet by mouth daily. 90 tablet 3   amLODipine (NORVASC) 5 MG tablet      No facility-administered medications prior to visit.    No Known Allergies  ROS Review of Systems  Constitutional: Negative.  Negative for chills, diaphoresis and fever.  HENT:  Negative for congestion, dental problem, ear pain, hearing loss, nosebleeds, postnasal drip, rhinorrhea, sinus pressure, sore throat, tinnitus, trouble swallowing and voice change.   Eyes: Negative.  Negative for photophobia and redness.  Respiratory: Negative.  Negative for apnea, cough, choking, chest tightness, shortness of breath, wheezing and stridor.   Cardiovascular: Negative.  Negative for chest pain, palpitations and leg swelling.  Gastrointestinal: Negative.  Negative for abdominal distention, abdominal pain, blood in stool, constipation, diarrhea, nausea and vomiting.  Endocrine: Negative for polydipsia.  Genitourinary: Negative.  Negative for dysuria, flank pain, frequency, hematuria and urgency.  Musculoskeletal:   Negative for arthralgias, back pain, myalgias and neck pain.  Skin: Negative.  Negative for rash.  Allergic/Immunologic: Negative.  Negative for environmental allergies and food allergies.  Neurological: Negative.  Negative for dizziness, tremors, seizures, syncope, weakness and headaches.  Hematological: Negative.  Negative for adenopathy. Does not bruise/bleed easily.  Psychiatric/Behavioral:  Positive for dysphoric mood. Negative for agitation, sleep disturbance and suicidal ideas. The patient is not nervous/anxious.       Objective:     No exam this is a phone visit There were no vitals taken for this visit. Wt Readings from Last 3 Encounters:  11/04/22 181 lb 9.6 oz (82.4 kg)  10/19/22 179 lb (81.2 kg)  06/23/22 176 lb 6.4 oz (80 kg)     Health Maintenance Due  Topic Date Due   Zoster Vaccines- Shingrix (1 of 2) Never done   COLON CANCER SCREENING ANNUAL FOBT  07/29/2021   COVID-19 Vaccine (5 - 2023-24 season) 12/30/2022    There are no preventive care reminders to display for this patient.  No results found for: "TSH" Lab Results  Component Value Date   WBC 4.5 11/04/2022   HGB 16.3 11/04/2022   HCT 48.5 11/04/2022   MCV 84.8 11/04/2022   PLT 164 11/04/2022   Lab Results  Component Value Date   NA 141 11/04/2022   K 3.6 11/04/2022   CO2 27 11/04/2022   GLUCOSE 79 11/04/2022   BUN 18 11/04/2022   CREATININE 1.01 11/04/2022   BILITOT 0.4 11/04/2022   ALKPHOS 117 06/23/2022   AST 20 11/04/2022   ALT 17 11/04/2022   PROT 7.1 11/04/2022   ALBUMIN 5.2 (H) 06/23/2022   CALCIUM 10.2 11/04/2022   ANIONGAP 9 08/17/2021   EGFR 88 11/04/2022   Lab Results  Component Value Date   CHOL 210 (H) 09/25/2020   Lab Results  Component Value Date   HDL 55 09/25/2020   Lab Results  Component Value Date   LDLCALC 129 (H) 09/25/2020   Lab Results  Component Value Date   TRIG 144 09/25/2020  Lab Results  Component Value Date   CHOLHDL 3.8 09/25/2020   No  results found for: "HGBA1C"    Assessment & Plan:   Problem List Items Addressed This Visit       Other   Major depression - Primary    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  Follow Up Instructions:    I discussed the assessment and treatment plan with the patient. The patient was provided an opportunity to ask questions and all were answered. The patient agreed with the plan and demonstrated an understanding of the instructions.   The patient was advised to call back or seek an in-person evaluation if the symptoms worsen or if the condition fails to improve as anticipated.  I provided 30 minutes of non-face-to-face time during this encounter  including  median intraservice time , review of notes, labs, imaging, medications  and explaining diagnosis and management to the patient .    Shan Levans, MD   CC:  Follow-up: No follow-ups on file.    Shan Levans, MD

## 2023-01-20 DIAGNOSIS — Z029 Encounter for administrative examinations, unspecified: Secondary | ICD-10-CM | POA: Insufficient documentation

## 2023-01-20 NOTE — Assessment & Plan Note (Signed)
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

## 2023-02-22 ENCOUNTER — Ambulatory Visit: Payer: Medicaid Other | Admitting: Critical Care Medicine

## 2023-03-01 NOTE — Progress Notes (Signed)
The 10-year ASCVD risk score (Arnett DK, et al., 2019) is: 17.4%   Values used to calculate the score:     Age: 57 years     Sex: Male     Is Non-Hispanic African American: Yes     Diabetic: No     Tobacco smoker: Yes     Systolic Blood Pressure: 128 mmHg     Is BP treated: Yes     HDL Cholesterol: 75 mg/dL     Total Cholesterol: 220 mg/dL  Sandie Ano, RN

## 2023-03-02 ENCOUNTER — Other Ambulatory Visit: Payer: Self-pay | Admitting: Critical Care Medicine

## 2023-03-13 ENCOUNTER — Other Ambulatory Visit: Payer: Self-pay | Admitting: Critical Care Medicine

## 2023-03-15 NOTE — Telephone Encounter (Signed)
Requested medication (s) are due for refill today - no  Requested medication (s) are on the active medication list -yes  Future visit scheduled -no  Last refill: 10/19/22 #90 1RF  Notes to clinic: non delegated Rx  Requested Prescriptions  Pending Prescriptions Disp Refills   QUEtiapine (SEROQUEL) 100 MG tablet [Pharmacy Med Name: QUETIAPINE 100MG  TABLETS] 90 tablet 1    Sig: TAKE 1 TABLET(100 MG) BY MOUTH AT BEDTIME     Not Delegated - Psychiatry:  Antipsychotics - Second Generation (Atypical) - quetiapine Failed - 03/13/2023  1:56 PM      Failed - This refill cannot be delegated      Failed - Lipid Panel in normal range within the last 12 months    Cholesterol  Date Value Ref Range Status  09/25/2020 210 (H) <200 mg/dL Final   LDL Cholesterol (Calc)  Date Value Ref Range Status  09/25/2020 129 (H) mg/dL (calc) Final    Comment:    Reference range: <100 . Desirable range <100 mg/dL for primary prevention;   <70 mg/dL for patients with CHD or diabetic patients  with > or = 2 CHD risk factors. Marland Kitchen LDL-C is now calculated using the Martin-Hopkins  calculation, which is a validated novel method providing  better accuracy than the Friedewald equation in the  estimation of LDL-C.  Horald Pollen et al. Lenox Ahr. 8657;846(96): 2061-2068  (http://education.[facility].com/faq/FAQ164)    HDL  Date Value Ref Range Status  09/25/2020 55 > OR = 40 mg/dL Final   Triglycerides  Date Value Ref Range Status  09/25/2020 144 <150 mg/dL Final         Passed - TSH in normal range and within 360 days    No results found for: "TSH", "POCTSH", "TSHREFLEX"       Passed - Completed PHQ-2 or PHQ-9 in the last 360 days      Passed - Last BP in normal range    BP Readings from Last 1 Encounters:  11/04/22 130/83         Passed - Last Heart Rate in normal range    Pulse Readings from Last 1 Encounters:  11/04/22 73         Passed - Valid encounter within last 6 months    Recent  Outpatient Visits           1 month ago Severe episode of recurrent major depressive disorder, without psychotic features (HCC)   [facility] Storm Frisk, [facility]   4 months ago Primary hypertension   [facility] Storm Frisk, MD   8 months ago Primary hypertension   [facility] Storm Frisk, MD   1 year ago Primary hypertension   [place] [facility] Storm Frisk, MD   1 year ago Primary hypertension    [facility] Storm Frisk, MD       Future Appointments             In 1 month Vu, Tonita Phoenix, MD [facility], [facility]            Passed - CBC within normal limits and completed in the last 12 months    WBC  Date Value Ref Range Status  11/04/2022 4.5 3.8 - 10.8 Thousand/uL Final   RBC  Date Value Ref Range Status  11/04/2022 5.72 4.20 -  5.80 Million/uL Final   Hemoglobin  Date Value Ref Range Status  11/04/2022 16.3 13.2 - 17.1 g/dL Final  16/04/9603 54.0 13.0 - 17.7 g/dL Final   HCT  Date Value Ref Range Status  11/04/2022 48.5 38.5 - 50.0 % Final   Hematocrit  Date Value Ref Range Status  06/23/2022 49.8 37.5 - 51.0 % Final   MCHC  Date Value Ref Range Status  11/04/2022 33.6 32.0 - 36.0 g/dL Final   [facility]  Date Value Ref Range Status  11/04/2022 28.5 27.0 - 33.0 pg Final   MCV  Date Value Ref Range Status  11/04/2022 84.8 80.0 - 100.0 fL Final  06/23/2022 86 79 - 97 fL Final   No results found for: "PLTCOUNTKUC", "LABPLAT", "POCPLA" RDW  Date Value Ref Range Status  11/04/2022 14.7 11.0 - 15.0 % Final  06/23/2022 13.7 11.6 - 15.4 % Final         Passed - CMP within normal limits and completed in the last 12 months    Albumin  Date Value Ref Range Status  06/23/2022 5.2 (H) 3.8 - 4.9 g/dL Final   Alkaline Phosphatase  Date Value  Ref Range Status  06/23/2022 117 44 - 121 IU/L Final   Alkaline phosphatase (APISO)  Date Value Ref Range Status  11/04/2022 83 35 - 144 U/L Final   ALT  Date Value Ref Range Status  11/04/2022 17 9 - 46 U/L Final   AST  Date Value Ref Range Status  11/04/2022 20 10 - 35 U/L Final   BUN  Date Value Ref Range Status  11/04/2022 18 7 - 25 mg/dL Final  98/05/9146 15 6 - 24 mg/dL Final   Calcium  Date Value Ref Range Status  11/04/2022 10.2 8.6 - 10.3 mg/dL Final   Calcium, Ion  Date Value Ref Range Status  08/17/2021 1.16 1.15 - 1.40 mmol/L Final   CO2  Date Value Ref Range Status  11/04/2022 27 20 - 32 mmol/L Final   TCO2  Date Value Ref Range Status  08/17/2021 26 22 - 32 mmol/L Final   Creat  Date Value Ref Range Status  11/04/2022 1.01 0.70 - 1.30 mg/dL Final   Glucose, Bld  Date Value Ref Range Status  11/04/2022 79 65 - 99 mg/dL Final    Comment:    .            Fasting reference interval .    Glucose-Capillary  Date Value Ref Range Status  08/17/2021 110 (H) 70 - 99 mg/dL Final    Comment:    Glucose reference range applies only to samples taken after fasting for at least 8 hours.   Potassium  Date Value Ref Range Status  11/04/2022 3.6 3.5 - 5.3 mmol/L Final   Sodium  Date Value Ref Range Status  11/04/2022 141 135 - 146 mmol/L Final  06/23/2022 140 134 - 144 mmol/L Final   Total Bilirubin  Date Value Ref Range Status  11/04/2022 0.4 0.2 - 1.2 mg/dL Final   Bilirubin Total  Date Value Ref Range Status  06/23/2022 0.5 0.0 - 1.2 mg/dL Final   Protein, ur  Date Value Ref Range Status  09/25/2020 NEGATIVE NEGATIVE Final   Total Protein  Date Value Ref Range Status  11/04/2022 7.1 6.1 - 8.1 g/dL Final  82/95/6213 7.7 6.0 - 8.5 g/dL Final   GFR, Est African American  Date Value Ref Range Status  09/25/2020 118 > OR = 60 mL/min/1.74m2 Final  eGFR  Date Value Ref Range Status  11/04/2022 88 > OR = 60 mL/min/1.74m2 Final  06/23/2022  89 >59 mL/min/1.73 Final   GFR, Est Non African American  Date Value Ref Range Status  09/25/2020 101 > OR = 60 mL/min/1.45m2 Final   GFR, Estimated  Date Value Ref Range Status  08/17/2021 >60 >60 mL/min Final    Comment:    (NOTE) Calculated using the CKD-EPI Creatinine Equation (2021)             Requested Prescriptions  Pending Prescriptions Disp Refills   QUEtiapine (SEROQUEL) 100 MG tablet [Pharmacy Med Name: QUETIAPINE 100MG  TABLETS] 90 tablet 1    Sig: TAKE 1 TABLET(100 MG) BY MOUTH AT BEDTIME     Not Delegated - Psychiatry:  Antipsychotics - Second Generation (Atypical) - quetiapine Failed - 03/13/2023  1:56 PM      Failed - This refill cannot be delegated      Failed - Lipid Panel in normal range within the last 12 months    Cholesterol  Date Value Ref Range Status  09/25/2020 210 (H) <200 mg/dL Final   LDL Cholesterol (Calc)  Date Value Ref Range Status  09/25/2020 129 (H) mg/dL (calc) Final    Comment:    Reference range: <100 . Desirable range <100 mg/dL for primary prevention;   <70 mg/dL for patients with CHD or diabetic patients  with > or = 2 CHD risk factors. Marland Kitchen LDL-C is now calculated using the Martin-Hopkins  calculation, which is a validated novel method providing  better accuracy than the Friedewald equation in the  estimation of LDL-C.  Horald Pollen et al. Lenox Ahr. 0981;191(47): 2061-2068  (http://education.[facility].com/faq/FAQ164)    HDL  Date Value Ref Range Status  09/25/2020 55 > OR = 40 mg/dL Final   Triglycerides  Date Value Ref Range Status  09/25/2020 144 <150 mg/dL Final         Passed - TSH in normal range and within 360 days    No results found for: "TSH", "POCTSH", "TSHREFLEX"       Passed - Completed PHQ-2 or PHQ-9 in the last 360 days      Passed - Last BP in normal range    BP Readings from Last 1 Encounters:  11/04/22 130/83         Passed - Last Heart Rate in normal range    Pulse Readings from Last 1  Encounters:  11/04/22 73         Passed - Valid encounter within last 6 months    Recent Outpatient Visits           1 month ago Severe episode of recurrent major depressive disorder, without psychotic features (HCC)   [place] [facility] Storm Frisk, MD   4 months ago Primary hypertension   [place] [facility] Storm Frisk, MD   8 months ago Primary hypertension   [place] [facility] Storm Frisk, MD   1 year ago Primary hypertension   [facility] Storm Frisk, MD   1 year ago Primary hypertension   [place] [facility] Storm Frisk, MD       Future Appointments             In 1 month Vu, Tonita Phoenix, MD [facility] for [facility]  Passed - CBC within normal limits and completed in the last 12 months    WBC  Date Value Ref Range Status  11/04/2022 4.5 3.8 - 10.8 Thousand/uL Final   RBC  Date Value Ref Range Status  11/04/2022 5.72 4.20 - 5.80 Million/uL Final   Hemoglobin  Date Value Ref Range Status  11/04/2022 16.3 13.2 - 17.1 g/dL Final  16/04/9603 54.0 13.0 - 17.7 g/dL Final   HCT  Date Value Ref Range Status  11/04/2022 48.5 38.5 - 50.0 % Final   Hematocrit  Date Value Ref Range Status  06/23/2022 49.8 37.5 - 51.0 % Final   MCHC  Date Value Ref Range Status  11/04/2022 33.6 32.0 - 36.0 g/dL Final   [facility]  Date Value Ref Range Status  11/04/2022 28.5 27.0 - 33.0 pg Final   MCV  Date Value Ref Range Status  11/04/2022 84.8 80.0 - 100.0 fL Final  06/23/2022 86 79 - 97 fL Final   No results found for: "PLTCOUNTKUC", "LABPLAT", "POCPLA" RDW  Date Value Ref Range Status  11/04/2022 14.7 11.0 - 15.0 % Final  06/23/2022 13.7 11.6 - 15.4 % Final         Passed - CMP within normal limits and completed in the last 12 months    Albumin  Date Value  Ref Range Status  06/23/2022 5.2 (H) 3.8 - 4.9 g/dL Final   Alkaline Phosphatase  Date Value Ref Range Status  06/23/2022 117 44 - 121 IU/L Final   Alkaline phosphatase (APISO)  Date Value Ref Range Status  11/04/2022 83 35 - 144 U/L Final   ALT  Date Value Ref Range Status  11/04/2022 17 9 - 46 U/L Final   AST  Date Value Ref Range Status  11/04/2022 20 10 - 35 U/L Final   BUN  Date Value Ref Range Status  11/04/2022 18 7 - 25 mg/dL Final  98/05/9146 15 6 - 24 mg/dL Final   Calcium  Date Value Ref Range Status  11/04/2022 10.2 8.6 - 10.3 mg/dL Final   Calcium, Ion  Date Value Ref Range Status  08/17/2021 1.16 1.15 - 1.40 mmol/L Final   CO2  Date Value Ref Range Status  11/04/2022 27 20 - 32 mmol/L Final   TCO2  Date Value Ref Range Status  08/17/2021 26 22 - 32 mmol/L Final   Creat  Date Value Ref Range Status  11/04/2022 1.01 0.70 - 1.30 mg/dL Final   Glucose, Bld  Date Value Ref Range Status  11/04/2022 79 65 - 99 mg/dL Final    Comment:    .            Fasting reference interval .    Glucose-Capillary  Date Value Ref Range Status  08/17/2021 110 (H) 70 - 99 mg/dL Final    Comment:    Glucose reference range applies only to samples taken after fasting for at least 8 hours.   Potassium  Date Value Ref Range Status  11/04/2022 3.6 3.5 - 5.3 mmol/L Final   Sodium  Date Value Ref Range Status  11/04/2022 141 135 - 146 mmol/L Final  06/23/2022 140 134 - 144 mmol/L Final   Total Bilirubin  Date Value Ref Range Status  11/04/2022 0.4 0.2 - 1.2 mg/dL Final   Bilirubin Total  Date Value Ref Range Status  06/23/2022 0.5 0.0 - 1.2 mg/dL Final   Protein, ur  Date Value Ref Range Status  09/25/2020 NEGATIVE NEGATIVE Final   Total Protein  Date Value Ref Range Status  11/04/2022 7.1 6.1 - 8.1 g/dL Final  25/36/6440 7.7 6.0 - 8.5 g/dL Final   GFR, Est African American  Date Value Ref Range Status  09/25/2020 118 > OR = 60 mL/min/1.18m2 Final    eGFR  Date Value Ref Range Status  11/04/2022 88 > OR = 60 mL/min/1.53m2 Final  06/23/2022 89 >59 mL/min/1.73 Final   GFR, Est Non African American  Date Value Ref Range Status  09/25/2020 101 > OR = 60 mL/min/1.7m2 Final   GFR, Estimated  Date Value Ref Range Status  08/17/2021 >60 >60 mL/min Final    Comment:    (NOTE) Calculated using the CKD-EPI Creatinine Equation (2021)

## 2023-04-08 ENCOUNTER — Other Ambulatory Visit: Payer: Self-pay | Admitting: Critical Care Medicine

## 2023-04-08 NOTE — Telephone Encounter (Signed)
Medication Refill - Medication: gabapentin (NEURONTIN) 600 MG tablet [[id number]]   Has the patient contacted their pharmacy? Yes.   (Agent: If no, request that the patient contact the pharmacy for the refill. If patient does not wish to contact the pharmacy document the reason why and proceed with request.) (Agent: If yes, when and what did the pharmacy advise?)  Preferred Pharmacy (with phone number or street name): [id number] - [place], [place] - [address] AT [facility] [address] Phone: [phone]  Fax: [phone]   Has the patient been seen for an appointment in the last year OR does the patient have an upcoming appointment? Yes.    Agent: Please be advised that RX refills may take up to 3 business days. We ask that you follow-up with your pharmacy.

## 2023-04-11 MED ORDER — GABAPENTIN 600 MG PO TABS: 600.0000 mg | ORAL_TABLET | Freq: Three times a day (TID) | ORAL | 1 refills | Status: DC | PRN

## 2023-04-11 NOTE — Telephone Encounter (Signed)
Requested medications are due for refill today. unsure  Requested medications are on the active medications list.  yes  Last refill. 01/12/2023  Future visit scheduled.   no  Notes to clinic.  Medication is listed as historical.    Requested Prescriptions  Pending Prescriptions Disp Refills   gabapentin (NEURONTIN) 600 MG tablet      Sig: Take 1 tablet (600 mg total) by mouth 3 (three) times daily as needed.     Neurology: Anticonvulsants - gabapentin Passed - 04/08/2023  2:51 PM      Passed - Cr in normal range and within 360 days    Creat  Date Value Ref Range Status  11/04/2022 1.01 0.70 - 1.30 mg/dL Final         Passed - Completed PHQ-2 or PHQ-9 in the last 360 days      Passed - Valid encounter within last 12 months    Recent Outpatient Visits           2 months ago Severe episode of recurrent major depressive disorder, without psychotic features [facility])   [place] [facility] Storm Frisk, MD   5 months ago Primary hypertension   [place] [facility] Storm Frisk, MD   9 months ago Primary hypertension   [place] [facility] Storm Frisk, MD   1 year ago Primary hypertension   [place] [facility] Storm Frisk, MD   2 years ago Primary hypertension   [place] [facility] Storm Frisk, MD       Future Appointments             In 3 weeks Vu, Tonita Phoenix, MD [facility]

## 2023-05-03 ENCOUNTER — Other Ambulatory Visit: Payer: Self-pay

## 2023-05-03 ENCOUNTER — Ambulatory Visit (INDEPENDENT_AMBULATORY_CARE_PROVIDER_SITE_OTHER): Payer: 59 | Admitting: Internal Medicine

## 2023-05-03 ENCOUNTER — Other Ambulatory Visit (HOSPITAL_COMMUNITY)
Admission: RE | Admit: 2023-05-03 | Discharge: 2023-05-03 | Disposition: A | Discharge status: Home / Self Care | Payer: 59 | Source: Ambulatory Visit | Attending: Internal Medicine | Admitting: Internal Medicine

## 2023-05-03 ENCOUNTER — Encounter: Payer: Self-pay | Admitting: Internal Medicine

## 2023-05-03 VITALS — BP 132/87 | HR 61 | Temp 97.0°F | Wt 175.0 lb

## 2023-05-03 DIAGNOSIS — Z23 Encounter for immunization: Secondary | ICD-10-CM

## 2023-05-03 DIAGNOSIS — Z113 Encounter for screening for infections with a predominantly sexual mode of transmission: Secondary | ICD-10-CM

## 2023-05-03 DIAGNOSIS — E291 Testicular hypofunction: Secondary | ICD-10-CM | POA: Diagnosis not present

## 2023-05-03 DIAGNOSIS — Z125 Encounter for screening for malignant neoplasm of prostate: Secondary | ICD-10-CM | POA: Diagnosis not present

## 2023-05-03 DIAGNOSIS — Z79899 Other long term (current) drug therapy: Secondary | ICD-10-CM | POA: Diagnosis not present

## 2023-05-03 DIAGNOSIS — B20 Human immunodeficiency virus [HIV] disease: Secondary | ICD-10-CM | POA: Diagnosis not present

## 2023-05-03 MED ORDER — BICTEGRAVIR-EMTRICITAB-TENOFOV 50-200-25 MG PO TABS: 1.0000 | ORAL_TABLET | Freq: Every day | ORAL | 11 refills | Status: DC

## 2023-05-03 MED ORDER — TESTOSTERONE CYPIONATE 100 MG/ML IM SOLN: 100.0000 mg | INTRAMUSCULAR | 3 refills | Status: DC

## 2023-05-03 NOTE — Patient Instructions (Addendum)
I have refilled your testosterone level. Some more labs for it today   I hope you do ok with your wife situation and let us know if we can be of any support   Continue biktarvy   Please think about getting back on statin (your cholesterol doesn't have to be high); this has been proven to reduce risk of heart attack and stroke in people living with hiv

## 2023-05-03 NOTE — Progress Notes (Signed)
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 male here for ongoing hiv care  05/03/23 id f/u Social - wife passed away recently 2 weeks ago. He had to do cpr on her when he found her pulseless. Patient is hanging there. Patient's wife extended family around/supportive No concern with his biktarvy which he hasn't missed dose     11/04/22 id f/u No missed dose biktarvy last 4 weeks He still haven't seen psych counseling. His primary care doc prescribed wellbutrin/seroquel. Appetite better. He is not taking remeron.  He started testosterone injection 04/2022 by his pcp  Social -- he is doing side jobs now. Patient is married.   No complaint today  He is talking with his pcp numbness/tingling in right hand going up into the arm. No neck pain   05/04/22 id f/u Patient given remeron for poor appetite, depression sx last visit 09/2021. He thinks that is helping with sleeping/appetite. But he had seen mental health and was put on wellbutrin 150, seroquel 100 qhs, and prn gabapentin He has a pacemaker put recently as well 02[place]22 [place] He hasn't done labs since 02/2021. Well controlled previously on biktarvy He has no missed dose of biktarvy the last 4weeks  Appetite is good No anxiety/depression at this time -- happy with his regimen as above  Not currently working as of 02/2022 cause of heart block. Not on disability. Looking for new job  He wants to  test testosterone level due to poor libido/depression/and poor muscle mass   09/30/21 id f/u Doing well on biktarvy. No missed dose last 4 weeks Has labs done 08/17/21 -- reviewed; that was when he presented to urgent care with severe headache. Slightly low potassium assymptomatic. Mri/mra brain normal. No headache since that ed visit -- no new chronic medication No hiv labs done yet. Dx'ed with migraine Patient reports poor appetite. Some weight loss. I reviewed flow sheet. He has gone from 168 pounds --> 155 pounds today Patient has a cigar here and there but smoked before No blood/melena per rectum No cough/sob No hx cancer No family hx cancer Patient has a pcp in [place]. Patient has a colonoscopy scheduled in near future  Patient doesn't have diabetes mellitus. Denies triple P of diabetes.  Any weakness/excessive heat-cold intolerance/easy bleeding bruising/hair falling out  Wife well controlled with hiv Patient doing welding still No children between him/his current wife  He is not interested in std screen   Last 2-4 weeks more depressed... wife/work. Sleeping issue/interfere with work. No prior depression. Not on medication. No alcohol. No SI/HI. Social support system include mother/siblings -- patient closed with them. Not speaking with them much about this.  No panic attack Doesn't think the problem will go away   No prior of sleep apnea  05/25/21 id f/u Social -- Psychologist, occupational. Lives with wife now; married 02/28/2021. Wife is HIV positive and controlled on medication Reviewed labs from 02/2021 when  he missed his appointment HIV rna quant <20 and has been undetectable in our system since 09/2020 No missed dose biktarvy last 4 weeks ROS -- no fever, chill, weight loss, headache, cough, chest pain, abd pain/n/v/diarrhea, rash, focal weakness/numbness-tingling  Has 3-4 days right armpit region tender nodule. Just changed deodorant recently when it occurs. 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 He is frustrated waiting too long for the licence to come    He first saw me on 10/09/2020 in [facility] ----------------------------------------- background He is currently well controlled on prezcobix/descovy  #hiv -dx'ed 1991; got it from his wife; no ivdu -remembers taking bactrim for a long time; but doesn't remember any OI history -previous HIV provider [facility] Lenise Arena most recent prior to going to [place]) -therapy Approximately 2016-Currently on prezcobix/descovy Doesn't recall switching ART regimen previously due to failure Previous therapy include kaletra, atazanavir, lamivudine, truvada, combivir, azt -still have medication from prison  #positive quantiferon gold testing/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 Took inh for 6-9 months 1980s -currently no cough, chest pain, sob -endorse some weight loss (185 lbs --> 169 lbs since prison) - he thinks stress/food related from prison -had good appetite before, but not as good since prison time, lots of stress now looking for place to stay/job -no fever/chill/nightsweat, rash -have chronic knees pain no new joint pain  #htn -taking hctz -continue to see pcp dr Delford Field   #social -he was recently incarcerated in [place] (2020-2021) prior to transfer here -his wife passed away from AIDS 1990s -just relocated from [place] 08/2020 -no relative here; staying at  [employer] -in the process of getting a job; he previously drive trucks in [place] -not in active sexual relationship at this time -smoking; no etoh; no street drugs  Review of Systems: ROS Negative other ROS      PMH: htn hiv  Social History   Tobacco Use   Smoking status: Every Day    Types: Cigars   Smokeless tobacco: Never   Tobacco comments:    Smokes 1 cigar/day.    Quit smoking cigarettes as of office visit on 09/30/2021.  Substance Use Topics   Alcohol use: Not Currently   Drug use: Never    Fam hx: No cancer, dm2, rheumatologic disease  OBJECTIVE: Vitals:   05/03/23 1101  BP: 132/87  Pulse: 61  Temp: (!) 97 F (36.1 C)  TempSrc: Oral  SpO2: 97%  Weight: 175 lb (79.4 kg)    There is no height or weight on file to calculate BMI.   Exam: General/constitutional: no distress, pleasant HEENT: Normocephalic, PER, Conj Clear, EOMI, Oropharynx clear Neck supple CV: rrr no mrg Lungs: clear to auscultation, normal respiratory effort Abd: Soft, Nontender Ext: no edema Skin: No Rash Neuro: nonfocal MSK: no peripheral joint swelling/tenderness/warmth; back spines nontender          Lab: Lab Results  Component Value Date  WBC 4.5 11/04/2022   HGB 16.3 11/04/2022   HCT 48.5 11/04/2022   MCV 84.8 11/04/2022   PLT 164 11/04/2022     Chemistry      Component Value Date/Time   NA 141 11/04/2022 1107   NA 140 06/23/2022 1019   K 3.6 11/04/2022 1107   CL 105 11/04/2022 1107   CO2 27 11/04/2022 1107   BUN 18 11/04/2022 1107   BUN 15 06/23/2022 1019   CREATININE 1.01 11/04/2022 1107      Component Value Date/Time   CALCIUM 10.2 11/04/2022 1107   ALKPHOS 117 06/23/2022 1019   AST 20 11/04/2022 1107   ALT 17 11/04/2022 1107   BILITOT 0.4 11/04/2022 1107   BILITOT 0.5 06/23/2022 1019     Lab Results  Component Value Date   CHOL 210 (H) 09/25/2020   HDL 55 09/25/2020   LDLCALC 129 (H) 09/25/2020   TRIG 144 09/25/2020   CHOLHDL  3.8 09/25/2020    hiv          vl      /     cd4 (%) 10/2022  80 04/2022  UD    /     439 (21%) 02/2021    UD 09/25/20   UD    /     283 (18) 08/220    166 2020-2021 UD with blips here and there  Microbiology:  Serology: 02/2021 rpr negative  09/25/2020 Quant gold Positive Urine gc/chlam negative rpr nonreactive Hep c Ab, hep b sAg/sAb/cAb nonreactive Hep a Ab reactive  Imaging:   Assessment/plan: Problem List Items Addressed This Visit   None Visit Diagnoses     HIV disease (HCC)    -  Primary   Screening for STDs (sexually transmitted diseases)       Encounter for long-term (current) use of medications          #hiv #cad/cva  Heterosexual transmission Ryan White patient therapy prezcobix, descovy no prior VF. No prior genotype known. No prior ISI used prior to my 09/2020 visit  Switched to biktarvy 09/2020  Well controlled/compliant with biktarvy  05/03/23 doing well from hiv standpoint. A blip in April and advise to not miss any dose of biktarvy    -discussed u=u -encourage compliance -continue current HIV medication -labs 6 months will do labs then -discussed reprieve trial and he'll think about statin     #depression vs adjustment #weight loss #poor appetite On seroquel, wellbutrin, and prn gabapentin within mid 2023 and doing well  05/03/23 on same medication including trazodone. Wife passed away 2 weeks ago but he is mentally doing ok.       #question of androgen deficiency In setting of somatic sx and depression as above 04/2022 testosterone level 177 with normal sex hormone binding globulin. He is seeing primary care but not sure if able to get in soon   -refill his meds test cyp 100 mg/ml every 2 weeks -psa/testosterone level -f/u pcp      #htn #cad risk #sinus brady s/p pacer 02/2022 [place] Patient has a PCP. 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  [name], MD [facility] for Infectious Disease [facility] [phone] pager   [phone] cell 05/03/2023, 10:50 AM

## 2023-05-04 LAB — TESTOSTERONE TOTAL,FREE,BIO, MALES
Albumin: 5 g/dL (ref 3.6–5.1)
Sex Hormone Binding: 37 nmol/L (ref 22–77)
Testosterone: 222 ng/dL — ABNORMAL LOW (ref 250–827)

## 2023-05-04 LAB — URINE CYTOLOGY ANCILLARY ONLY
Chlamydia: NEGATIVE
Comment: NEGATIVE
Comment: NORMAL
Neisseria Gonorrhea: NEGATIVE

## 2023-05-04 LAB — PSA: PSA: 0.61 ng/mL (ref <=4.00)

## 2023-05-06 LAB — HIV-1 RNA QUANT-NO REFLEX-BLD
HIV 1 RNA Quant: <20 {copies}/mL — ABNORMAL HIGH
HIV-1 RNA Quant, Log: <1.3 {Log} — ABNORMAL HIGH

## 2023-05-06 LAB — RPR: RPR Ser Ql: NONREACTIVE

## 2023-05-24 ENCOUNTER — Ambulatory Visit: Payer: 59 | Admitting: Internal Medicine

## 2023-05-27 ENCOUNTER — Telehealth: Payer: Self-pay

## 2023-05-27 NOTE — Telephone Encounter (Signed)
ZDGLOV:[id number];JOACZY:[id number];Review Type:Prior Auth;Coverage Start Date:04/27/2023;Coverage End Date:05/26/2024;

## 2023-05-27 NOTE — Telephone Encounter (Signed)
Initiated PA for Testosterone Cypionate through covermymeds. Waiting on outcome.  Juanita Laster, RMA

## 2023-08-08 ENCOUNTER — Ambulatory Visit: Payer: 59 | Admitting: Internal Medicine

## 2023-10-10 ENCOUNTER — Ambulatory Visit: Admitting: Physician Assistant

## 2023-10-10 ENCOUNTER — Encounter: Payer: Self-pay | Admitting: Physician Assistant

## 2023-10-10 ENCOUNTER — Telehealth: Payer: Self-pay

## 2023-10-10 ENCOUNTER — Other Ambulatory Visit: Payer: Self-pay

## 2023-10-10 VITALS — BP 149/90 | HR 88 | Temp 97.8°F | Ht 70.0 in | Wt 176.0 lb

## 2023-10-10 DIAGNOSIS — F411 Generalized anxiety disorder: Secondary | ICD-10-CM

## 2023-10-10 DIAGNOSIS — Z21 Asymptomatic human immunodeficiency virus [HIV] infection status: Secondary | ICD-10-CM

## 2023-10-10 DIAGNOSIS — G8929 Other chronic pain: Secondary | ICD-10-CM | POA: Diagnosis not present

## 2023-10-10 DIAGNOSIS — I10 Essential (primary) hypertension: Secondary | ICD-10-CM | POA: Diagnosis not present

## 2023-10-10 DIAGNOSIS — K0889 Other specified disorders of teeth and supporting structures: Secondary | ICD-10-CM

## 2023-10-10 DIAGNOSIS — F3341 Major depressive disorder, recurrent, in partial remission: Secondary | ICD-10-CM

## 2023-10-10 DIAGNOSIS — F1010 Alcohol abuse, uncomplicated: Secondary | ICD-10-CM

## 2023-10-10 DIAGNOSIS — M545 Low back pain, unspecified: Secondary | ICD-10-CM

## 2023-10-10 DIAGNOSIS — K219 Gastro-esophageal reflux disease without esophagitis: Secondary | ICD-10-CM

## 2023-10-10 DIAGNOSIS — F1410 Cocaine abuse, uncomplicated: Secondary | ICD-10-CM

## 2023-10-10 DIAGNOSIS — M25511 Pain in right shoulder: Secondary | ICD-10-CM

## 2023-10-10 MED ORDER — LIDOCAINE 4 % EX PTCH: 1.0000 | MEDICATED_PATCH | Freq: Two times a day (BID) | CUTANEOUS | 1 refills | Status: DC

## 2023-10-10 MED ORDER — AMLODIPINE BESYLATE 10 MG PO TABS: 10.0000 mg | ORAL_TABLET | Freq: Every day | ORAL | 1 refills | Status: DC

## 2023-10-10 MED ORDER — BICTEGRAVIR-EMTRICITAB-TENOFOV 50-200-25 MG PO TABS: 1.0000 | ORAL_TABLET | Freq: Every day | ORAL | 2 refills | Status: DC

## 2023-10-10 MED ORDER — FAMOTIDINE 40 MG PO TABS: 40.0000 mg | ORAL_TABLET | Freq: Two times a day (BID) | ORAL | 1 refills | Status: DC

## 2023-10-10 MED ORDER — PENICILLIN V POTASSIUM 500 MG PO TABS: 500.0000 mg | ORAL_TABLET | Freq: Three times a day (TID) | ORAL | 0 refills | Status: AC

## 2023-10-10 MED ORDER — MELOXICAM 7.5 MG PO TABS: 7.5000 mg | ORAL_TABLET | Freq: Every day | ORAL | 0 refills | Status: DC

## 2023-10-10 NOTE — Telephone Encounter (Signed)
 Routing Triage

## 2023-10-10 NOTE — Progress Notes (Unsigned)
 Established Patient Office Visit  Subjective   Patient ID: Paul Blake, male    DOB: 01/08/1966  Age: 58 y.o. MRN: [id number]  Chief Complaint  Patient presents with   Medication Refill   Shoulder Injury    He reports he is having RT shoulder pain from a injury over a year ago.   Dental Pain    He is having dental pain that started about 1 week ago. Denies knowing of any cavaties   Discussed the use of AI scribe software for clinical note transcription with the patient, who gave verbal consent to proceed.  History of Present Illness         The patient, with a history of hypertension and a pacemaker implant due to a low heart rate, presents with right shoulder pain and dental pain. 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 The patient describes a sensation of bone grinding and constant pain, which has not improved with over-the-counter pain medications.   States that he has been having elevated blood pressure readings consistent with today's blood pressure reading.  The patient also reports dental pain that started a week ago, located in the upper right quadrant where a tooth is missing. The patient has not seen a dentist for this issue. Endorses swelling and foul taste in mouth.   The patient has been at [facility] since the 28th of the previous month, dealing with the loss of his wife, addiction, and a mental breakdown.     Past Medical History:  Diagnosis Date   Hypertension    Tuberculosis    Social History   Socioeconomic History   Marital status: Married    Spouse name: Not on file   Number of children: Not on file   Years of education: Not on file   Highest education level: Not on file  Occupational History   Not on file  Tobacco Use   Smoking status: Every Day    Types: Cigars   Smokeless tobacco: Former   Tobacco comments:    Smokes 1 cigar/day.    Quit smoking cigarettes as of office visit on 09/30/2021.   Substance and Sexual Activity   Alcohol use: Not Currently   Drug use: Never   Sexual activity: Not Currently    Comment: declined condoms  Other Topics Concern   Not on file  Social History Narrative   Not on file   Social Drivers of Health   Financial Resource Strain: Low Risk  (09/28/2022)   Received from [facility]   Overall Financial Resource Strain (CARDIA)    Difficulty of Paying Living Expenses: Not hard at all  Food Insecurity: No Food Insecurity (09/28/2022)   [facility]   Hunger Vital Sign    Worried About Running Out of Food in the Last Year: Never true    Ran Out of Food in the Last Year: Never true  Transportation Needs: No Transportation Needs (09/28/2022)   [facility]   PRAPARE - Transportation    Lack of Transportation (Medical): No    Lack of Transportation (Non-Medical): No  Physical Activity: Not on file  Stress: Not on file (06/01/2023)  Social Connections: Unknown (02/17/2022)   Received from [facility]   Social Network    Social Network: Not on file  Intimate Partner Violence: Unknown (02/17/2022)   Received from [facility]   HITS  Physically Hurt: Not on file    Insult or Talk Down To: Not on file    Threaten Physical Harm: Not on file    Scream or Curse: Not on file   History reviewed. No pertinent family history. No Known Allergies  Review of Systems  Constitutional: Negative.   HENT: Negative.    Eyes: Negative.   Respiratory:  Negative for shortness of breath.   Cardiovascular:  Negative for chest pain.  Gastrointestinal: Negative.   Genitourinary: Negative.   Musculoskeletal:  Positive for joint pain and myalgias.  Skin: Negative.   Neurological: Negative.   Endo/Heme/Allergies: Negative.   Psychiatric/Behavioral: Negative.        Objective:     BP (!) 149/90 (BP Location: Left Arm)   Pulse 88   Temp 97.8 F (36.6  C)   Ht 5\' 10"  (1.778 m)   Wt 176 lb (79.8 kg)   SpO2 99%   BMI 25.25 kg/m  BP Readings from Last 3 Encounters:  10/10/23 (!) 149/90  05/03/23 132/87  11/04/22 130/83   Wt Readings from Last 3 Encounters:  10/10/23 176 lb (79.8 kg)  05/03/23 175 lb (79.4 kg)  11/04/22 181 lb 9.6 oz (82.4 kg)    Physical Exam Vitals and nursing note reviewed.  Constitutional:      Appearance: Normal appearance.  HENT:     Head: Normocephalic and atraumatic.     Right Ear: External ear normal.     Left Ear: External ear normal.     Nose: Nose normal.     Mouth/Throat:     Mouth: Mucous membranes are moist.     Dentition: Dental tenderness, gingival swelling, dental caries and dental abscesses present.     Pharynx: Oropharynx is clear.  Eyes:     Extraocular Movements: Extraocular movements intact.     Conjunctiva/sclera: Conjunctivae normal.     Pupils: Pupils are equal, round, and reactive to light.  Cardiovascular:     Rate and Rhythm: Normal rate and regular rhythm.     Pulses: Normal pulses.     Heart sounds: Normal heart sounds.  Pulmonary:     Effort: Pulmonary effort is normal.     Breath sounds: Normal breath sounds.  Musculoskeletal:     Right shoulder: Bony tenderness present. No swelling. Decreased range of motion. Decreased strength.     Left shoulder: Normal.     Cervical back: Normal range of motion and neck supple.  Skin:    General: Skin is warm and dry.  Neurological:     General: No focal deficit present.     Mental Status: He is alert and oriented to person, place, and time.  Psychiatric:        Mood and Affect: Mood normal.        Behavior: Behavior normal.        Thought Content: Thought content normal.        Judgment: Judgment normal.        Assessment & Plan:   Problem List Items Addressed This Visit       Cardiovascular and Mediastinum   HTN (hypertension) - Primary   Relevant Medications   amLODipine (NORVASC) 10 MG tablet     Digestive    Gastroesophageal reflux disease without esophagitis   Relevant Medications   famotidine (PEPCID) 40 MG tablet     Other   HIV (human immunodeficiency virus infection) (HCC)   Major depression   Cocaine abuse (HCC)   Alcohol abuse  Other Visit Diagnoses       Chronic right shoulder pain       Relevant Medications   meloxicam (MOBIC) 7.5 MG tablet   Other Relevant Orders   DG Shoulder Right     Pain, dental       Relevant Medications   penicillin v potassium (VEETID) 500 MG tablet   meloxicam (MOBIC) 7.5 MG tablet     Chronic right-sided low back pain without sciatica       Relevant Medications   meloxicam (MOBIC) 7.5 MG tablet   lidocaine 4 %     GAD (generalized anxiety disorder)           1. Primary hypertension (Primary) Elevated blood pressure with consistent amlodipine use. Dose increase suggested. - Increase amlodipine to 10 mg daily.  Patient encouraged to check blood pressure on a daily basis, keep a written log, patient to follow-up with mobile unit in 2 weeks.  Red flags given for prompt reevaluation - amLODipine (NORVASC) 10 MG tablet; Take 1 tablet (10 mg total) by mouth daily.  Dispense: 30 tablet; Refill: 1  2. Chronic right shoulder pain Chronic pain with limited motion and crepitus post-injury. Suspected inflammation and musculoskeletal injury. - Order right shoulder X-ray. - Prescribe meloxicam for two weeks. - Advise to avoid sleeping on the right side and use a pillow to prevent rolling onto it. - Recommend gentle stretching exercises. - DG Shoulder Right; Future - meloxicam (MOBIC) 7.5 MG tablet; Take 1 tablet (7.5 mg total) by mouth daily.  Dispense: 30 tablet; Refill: 0  3. Pain, dental Acute pain with swelling indicating possible infection. No current dentist but has dental coverage. - Prescribe penicillin to reduce infection. - Advise to see a dentist after discharge from the facility. - penicillin v potassium (VEETID) 500 MG tablet; Take 1  tablet (500 mg total) by mouth 3 (three) times daily for 10 days.  Dispense: 30 tablet; Refill: 0  4. Chronic right-sided low back pain without sciatica Continue current regimen - meloxicam (MOBIC) 7.5 MG tablet; Take 1 tablet (7.5 mg total) by mouth daily.  Dispense: 30 tablet; Refill: 0 - lidocaine 4 %; Place 1 patch onto the skin 2 (two) times daily.  Dispense: 60 patch; Refill: 1  5. Gastroesophageal reflux disease without esophagitis Continue current regimen - famotidine (PEPCID) 40 MG tablet; Take 1 tablet (40 mg total) by mouth 2 (two) times daily.  Dispense: 60 tablet; Refill: 1  6. Asymptomatic HIV infection, with no history of HIV-related illness (HCC) Managed by infectious disease, patient has upcoming appointment, encouraged keeping appointment.  Infectious disease also prescribes patient's testosterone  7. GAD (generalized anxiety disorder) Managed by [facility] psychiatry  8. Recurrent major depressive disorder, in partial remission (HCC)   9. Cocaine abuse (HCC) Currently in substance abuse treatment program  10. Alcohol abuse    I have reviewed the patient's medical history (PMH, PSH, Social History, Family History, Medications, and allergies) , and have been updated if relevant. I spent 30 minutes reviewing chart and  face to face time with patient. Return in about 2 weeks (around 10/24/2023) for With MMU.    Kasandra Knudsen Mayers, PA-C

## 2023-10-10 NOTE — Patient Instructions (Signed)
 VISIT SUMMARY:  During today's visit, we discussed your ongoing right shoulder pain, dental pain, sleep issues, hypertension, pacemaker management, low back pain, acid reflux, and testosterone replacement therapy. We have developed a plan to address each of these concerns to help improve your overall health and well-being.  YOUR PLAN:  -RIGHT SHOULDER PAIN: Your right shoulder pain is likely due to inflammation and a musculoskeletal injury from your previous work-related incident. We have ordered an X-ray to get a better look at the shoulder and prescribed meloxicam for two weeks to help reduce the pain and inflammation. Please avoid sleeping on your right side and use a pillow to prevent rolling onto it. Gentle stretching exercises are also recommended.  -DENTAL PAIN: Your dental pain may be due to an infection, especially since there is swelling. We have prescribed penicillin to help reduce the infection. It is important to see a dentist after you are discharged from the facility to address this issue further.  -HYPERTENSION: Hypertension, or high blood pressure, can lead to serious health issues if not managed properly. We have increased your amlodipine dose to 10 mg daily to help control your blood pressure more effectively.  -PACEMAKER MANAGEMENT: Your pacemaker was placed to manage a low heart rate. It is important to have regular follow-ups to check the pacemaker settings and your heart's function. Please schedule a follow-up with your cardiologist for a pacemaker check and an echocardiogram.  -LOW BACK PAIN: Your chronic low back pain is being managed with lidocaine patches and specialist care. Continue using the lidocaine patches as prescribed and follow up with your pain specialist as scheduled.  -ACID REFLUX: Acid reflux occurs when stomach acid flows back into the esophagus, causing discomfort. Continue taking famotidine as prescribed, especially while you are taking NSAIDs like  meloxicam.  -TESTOSTERONE REPLACEMENT THERAPY: Your low testosterone levels are being managed with injections, and your last levels were normal. Please discuss your testosterone therapy with the infectious disease specialist during your upcoming appointment.  INSTRUCTIONS:  Please follow up with your cardiologist for a pacemaker check and an echocardiogram. Additionally, make an appointment with a dentist after your discharge from the facility to address your dental pain. Continue to follow up with your pain specialist as scheduled for your low back pain. Discuss your testosterone therapy with the infectious disease specialist during your upcoming appointment.

## 2023-10-10 NOTE — Telephone Encounter (Signed)
 Pt is currently at a treatment facility. Called and was needing to reschedule his appt around their transportation services. He did request if it was possible to send in refills for him till his f/u.   Pharmacy - [facility] [address] [place]  [phone]  His nurse did verify that if you may need to contact for additional concerns her name is Debbe Odea [phone]

## 2023-10-11 ENCOUNTER — Ambulatory Visit: Payer: 59 | Admitting: Internal Medicine

## 2023-10-12 ENCOUNTER — Encounter: Payer: Self-pay | Admitting: Physician Assistant

## 2023-10-12 DIAGNOSIS — F1010 Alcohol abuse, uncomplicated: Secondary | ICD-10-CM | POA: Insufficient documentation

## 2023-10-12 DIAGNOSIS — K219 Gastro-esophageal reflux disease without esophagitis: Secondary | ICD-10-CM | POA: Insufficient documentation

## 2023-10-12 DIAGNOSIS — F1410 Cocaine abuse, uncomplicated: Secondary | ICD-10-CM | POA: Insufficient documentation

## 2023-11-01 ENCOUNTER — Ambulatory Visit: Payer: 59 | Admitting: Internal Medicine

## 2023-11-09 ENCOUNTER — Ambulatory Visit: Admitting: Internal Medicine

## 2023-11-14 ENCOUNTER — Encounter: Payer: Self-pay | Admitting: Physician Assistant

## 2023-11-14 ENCOUNTER — Ambulatory Visit: Admitting: Physician Assistant

## 2023-11-14 VITALS — BP 133/85 | HR 76 | Ht 70.0 in | Wt 196.4 lb

## 2023-11-14 DIAGNOSIS — F1010 Alcohol abuse, uncomplicated: Secondary | ICD-10-CM

## 2023-11-14 DIAGNOSIS — I10 Essential (primary) hypertension: Secondary | ICD-10-CM

## 2023-11-14 DIAGNOSIS — M25511 Pain in right shoulder: Secondary | ICD-10-CM

## 2023-11-14 DIAGNOSIS — G8929 Other chronic pain: Secondary | ICD-10-CM | POA: Diagnosis not present

## 2023-11-14 DIAGNOSIS — K0889 Other specified disorders of teeth and supporting structures: Secondary | ICD-10-CM

## 2023-11-14 DIAGNOSIS — M545 Low back pain, unspecified: Secondary | ICD-10-CM | POA: Diagnosis not present

## 2023-11-14 DIAGNOSIS — Z21 Asymptomatic human immunodeficiency virus [HIV] infection status: Secondary | ICD-10-CM

## 2023-11-14 DIAGNOSIS — F1410 Cocaine abuse, uncomplicated: Secondary | ICD-10-CM

## 2023-11-14 MED ORDER — MELOXICAM 15 MG PO TABS: 15.0000 mg | ORAL_TABLET | Freq: Every day | ORAL | 0 refills | Status: DC

## 2023-11-14 MED ORDER — HYDROCHLOROTHIAZIDE 12.5 MG PO TABS: 12.5000 mg | ORAL_TABLET | Freq: Every day | ORAL | 1 refills | Status: DC

## 2023-11-14 NOTE — Progress Notes (Unsigned)
 Established Patient Office Visit  Subjective   Patient ID: Paul Blake, male    DOB: [birth year]  Age: 58 y.o. MRN: [id number]  Chief Complaint  Patient presents with   Medication Refill   Hypertension        Discussed the use of AI scribe software for clinical note transcription with the patient, who gave verbal consent to proceed.  History of Present Illness    The patient, with a history of hypertension, pacemaker placement for bradycardia, and lower back pain, is currently residing at [facility] with plans to transition to long-term care at [facility] in the next month. He reports that his blood pressure has been slightly elevated despite an increase in amlodipine  to 10mg  daily a month ago. He has previously been on a combination of valsartan  and hydrochlorothiazide , but is currently only on amlodipine  and metoprolol .  For his lower back pain, he has been using lidocaine  patches, which provide minimal relief, rating his pain as a 5 on a scale of 1 to 10. He has also been taking meloxicam , which has provided some improvement. However, he reports experiencing heartburn or acid reflux, which is managed with Pepcid  twice daily.   Past Medical History:  Diagnosis Date   Hypertension    Tuberculosis    Social History   Socioeconomic History   Marital status: Married    Spouse name: Not on file   Number of children: Not on file   Years of education: Not on file   Highest education level: Not on file  Occupational History   Not on file  Tobacco Use   Smoking status: Every Day    Types: Cigars   Smokeless tobacco: Former   Tobacco comments:    Smokes 1 cigar/day.    Quit smoking cigarettes as of office visit on 09/30/2021.  Vaping Use   Vaping status: Former   Substances: Nicotine  Substance and Sexual Activity   Alcohol use: Not Currently   Drug use: Not Currently    Types: "Crack" cocaine   Sexual activity: Not Currently    Comment: declined condoms  Other  Topics Concern   Not on file  Social History Narrative   Not on file   Social Drivers of Health   Financial Resource Strain: Low Risk  (09/28/2022)   Received from [facility], [facility]   Overall Financial Resource Strain (CARDIA)    Difficulty of Paying Living Expenses: Not hard at all  Food Insecurity: No Food Insecurity (09/28/2022)   Received from [facility], [facility]   Hunger Vital Sign    Worried About Running Out of Food in the Last Year: Never true    Ran Out of Food in the Last Year: Never true  Transportation Needs: No Transportation Needs (09/28/2022)   Received from [facility], [facility]   PRAPARE - Transportation    Lack of Transportation (Medical): No    Lack of Transportation (Non-Medical): No  Physical Activity: Not on file  Stress: Not on file (06/01/2023)  Social Connections: Unknown (02/17/2022)   Received from [facility], [facility]   Social Network    Social Network: Not on file  Intimate Partner Violence: Unknown (02/17/2022)   Received from [facility], [facility]   HITS    Physically Hurt: Not on file    Insult or Talk Down To: Not on file    Threaten Physical Harm: Not on file    Scream or Curse: Not on file   History  reviewed. No pertinent family history. No Known Allergies  Review of Systems  Constitutional: Negative.   HENT: Negative.    Eyes: Negative.   Respiratory:  Negative for shortness of breath.   Cardiovascular:  Negative for chest pain.  Gastrointestinal:  Positive for heartburn. Negative for abdominal pain and nausea.  Genitourinary: Negative.   Musculoskeletal:  Positive for back pain and myalgias.  Skin: Negative.   Neurological: Negative.   Endo/Heme/Allergies: Negative.   Psychiatric/Behavioral: Negative.        Objective:     BP 133/85 (BP Location: Left Arm, Patient Position: Sitting, Cuff Size: Large)   Pulse 76   Ht 5\' 10"  (1.778 m)   Wt 196 lb 6.4 oz (89.1 kg)   SpO2 97%   BMI 28.18  kg/m  BP Readings from Last 3 Encounters:  11/14/23 133/85  10/10/23 (!) 149/90  05/03/23 132/87   Wt Readings from Last 3 Encounters:  11/14/23 196 lb 6.4 oz (89.1 kg)  10/10/23 176 lb (79.8 kg)  05/03/23 175 lb (79.4 kg)    Physical Exam Vitals and nursing note reviewed.  Constitutional:      Appearance: Normal appearance.  HENT:     Head: Normocephalic and atraumatic.     Right Ear: External ear normal.     Left Ear: External ear normal.     Nose: Nose normal.     Mouth/Throat:     Mouth: Mucous membranes are moist.     Pharynx: Oropharynx is clear.  Eyes:     Extraocular Movements: Extraocular movements intact.     Conjunctiva/sclera: Conjunctivae normal.     Pupils: Pupils are equal, round, and reactive to light.  Cardiovascular:     Rate and Rhythm: Normal rate and regular rhythm.     Pulses: Normal pulses.     Heart sounds: Normal heart sounds.  Pulmonary:     Effort: Pulmonary effort is normal.     Breath sounds: Normal breath sounds.  Abdominal:     General: Abdomen is flat.     Palpations: Abdomen is soft.     Tenderness: There is no abdominal tenderness.  Musculoskeletal:        General: Normal range of motion.     Cervical back: Normal range of motion and neck supple.  Skin:    General: Skin is warm and dry.  Neurological:     General: No focal deficit present.     Mental Status: He is alert and oriented to person, place, and time.  Psychiatric:        Mood and Affect: Mood normal.        Behavior: Behavior normal.        Thought Content: Thought content normal.        Judgment: Judgment normal.         Assessment & Plan:   Problem List Items Addressed This Visit       Cardiovascular and Mediastinum   HTN (hypertension) - Primary   Relevant Medications   hydrochlorothiazide  (HYDRODIURIL ) 12.5 MG tablet     Other   HIV (human immunodeficiency virus infection) (HCC)   Chronic right-sided low back pain without sciatica   Relevant  Medications   meloxicam  (MOBIC ) 15 MG tablet   Cocaine abuse (HCC)   Alcohol abuse   Chronic right shoulder pain   Relevant Medications   meloxicam  (MOBIC ) 15 MG tablet   Other Visit Diagnoses       Pain, dental  1. Primary hypertension (Primary) Blood pressure slightly elevated on amlodipine  and metoprolol . Previous valsartan  hydrochlorothiazide  effective. Normal kidney and liver function. - Add hydrochlorothiazide  12.5 mg in the morning. - Reassess blood pressure in two weeks. 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 Metoprolol  not increased to avoid further heart rate reduction. - Continue metoprolol  without dosage increase.  - hydrochlorothiazide  (HYDRODIURIL ) 12.5 MG tablet; Take 1 tablet (12.5 mg total) by mouth daily.  Dispense: 30 tablet; Refill: 1  2. Chronic right shoulder pain Increase dose, consider ortho referral once patient has transitioned to LTC and is able to attend outpatient appointments.  - meloxicam  (MOBIC ) 15 MG tablet; Take 1 tablet (15 mg total) by mouth daily.  Dispense: 30 tablet; Refill: 0  3. Chronic right-sided low back pain without sciatica  - meloxicam  (MOBIC ) 15 MG tablet; Take 1 tablet (15 mg total) by mouth daily.  Dispense: 30 tablet; Refill: 0  4. Pain, dental Currently resolved    5. Asymptomatic HIV infection, with no history of HIV-related illness (HCC) Continue follow up with [facility]  6. Cocaine abuse (HCC) Currently in substance abuse treatment   7. Alcohol abuse   I have reviewed the patient's medical history (PMH, PSH, Social History, Family History, Medications, and allergies) , and have been updated if relevant. I spent 30 minutes reviewing chart and  face to face time with patient.   Return in about 2 weeks (around 11/28/2023) for With [facility].    Etter Hermann Mayers, PA-C

## 2023-11-14 NOTE — Patient Instructions (Signed)
 VISIT SUMMARY:  During your visit, we discussed your current health concerns, including your hypertension, pacemaker management, lower back pain, and gastroesophageal reflux disease (GERD). We have made some adjustments to your medications and discussed plans for further management.  YOUR PLAN:  -HYPERTENSION: Hypertension means high blood pressure. Your blood pressure has been slightly elevated despite your current medications. We are adding hydrochlorothiazide  12.5 mg in the morning to help control your blood pressure. We will reassess your blood pressure in two weeks.  -PACEMAKER IN SITU: A pacemaker helps manage your heart rate due to bradycardia (slow heart rate). We will continue your current dose of metoprolol  without any increase to avoid lowering your heart rate further.  -BACK PAIN: Chronic lower back pain is ongoing. Lidocaine  patches have provided minimal relief, and meloxicam  has helped somewhat. We are increasing your meloxicam  to 15 mg daily and considering a referral to an orthopedic specialist for possible injection therapy once you transition to long-term care. Continue using lidocaine  patches as needed.  -GASTROESOPHAGEAL REFLUX DISEASE (GERD): GERD is a condition where stomach acid frequently flows back into the tube connecting your mouth and stomach, causing heartburn. Your GERD symptoms are currently managed with Pepcid . Since we are increasing your meloxicam , which may worsen your symptoms, please monitor for any increase in GERD symptoms and use Maalox as needed. We will reassess your GERD in two weeks and consider alternative management if your symptoms worsen.

## 2023-11-16 ENCOUNTER — Encounter: Payer: Self-pay | Admitting: Physician Assistant

## 2023-11-16 DIAGNOSIS — G8929 Other chronic pain: Secondary | ICD-10-CM | POA: Insufficient documentation

## 2023-11-28 ENCOUNTER — Ambulatory Visit: Admitting: Physician Assistant

## 2023-11-28 ENCOUNTER — Encounter: Payer: Self-pay | Admitting: Physician Assistant

## 2023-11-28 VITALS — BP 119/81 | HR 76 | Ht 70.0 in | Wt 195.2 lb

## 2023-11-28 DIAGNOSIS — I10 Essential (primary) hypertension: Secondary | ICD-10-CM

## 2023-11-28 DIAGNOSIS — K219 Gastro-esophageal reflux disease without esophagitis: Secondary | ICD-10-CM

## 2023-11-28 DIAGNOSIS — M545 Low back pain, unspecified: Secondary | ICD-10-CM | POA: Diagnosis not present

## 2023-11-28 DIAGNOSIS — G8929 Other chronic pain: Secondary | ICD-10-CM

## 2023-11-28 DIAGNOSIS — Z1211 Encounter for screening for malignant neoplasm of colon: Secondary | ICD-10-CM

## 2023-11-28 DIAGNOSIS — R12 Heartburn: Secondary | ICD-10-CM

## 2023-11-28 DIAGNOSIS — M25511 Pain in right shoulder: Secondary | ICD-10-CM

## 2023-11-28 MED ORDER — FAMOTIDINE 40 MG PO TABS: 40.0000 mg | ORAL_TABLET | Freq: Two times a day (BID) | ORAL | 2 refills | Status: DC

## 2023-11-28 MED ORDER — METOPROLOL SUCCINATE ER 25 MG PO TB24: 25.0000 mg | ORAL_TABLET | Freq: Every day | ORAL | 2 refills | Status: DC

## 2023-11-28 MED ORDER — AMLODIPINE BESYLATE 10 MG PO TABS: 10.0000 mg | ORAL_TABLET | Freq: Every day | ORAL | 2 refills | Status: DC

## 2023-11-28 MED ORDER — MELOXICAM 15 MG PO TABS: 15.0000 mg | ORAL_TABLET | Freq: Every day | ORAL | 2 refills | Status: DC

## 2023-11-28 NOTE — Progress Notes (Signed)
 Established Patient Office Visit  Subjective   Patient ID: Paul Blake, male    DOB: [birth year]  Age: 58 y.o. MRN: [id number]  Chief Complaint  Patient presents with   Hypertension    Follow up    Discussed the use of AI scribe software for clinical note transcription with the patient, who gave verbal consent to proceed.  History of Present Illness   Paul Blake is a 58 year old male with hypertension who presents for medication management and follow-up.  His hypertension is generally well-controlled with amlodipine  10 mg.  hydrochlorothiazide  12.5 mg and metoprolol  25 mg daily. Recent blood pressure was 119/81 mmHg, with readings WNL at home.  He experiences fatigue, which he attributes to not receiving testosterone  shots. He is scheduled to see a specialist for this issue. (ID on 5/8)   He has persistent lower back pain and right shoulder pain, using a lidocaine  patch for relief. He is awaiting an orthopedic referral. Increase of mobic  offered little relief.   He has run out of antacid medication for heartburn, which was effective in managing symptoms. Last dose was yesterday morning.   He is overdue for a colonoscopy and wishes to undergo the procedure.  He is considering the shingles vaccine to prevent future outbreaks, having had a previous episode on his back. He will discuss with ID on May 8th.   He states that he saw cardiology on month ago and will continue follow up with cardiology for his pacemaker, with the last visit last month. He resides at [facility] and is transitioning to long-term care there.   Past Medical History:  Diagnosis Date   Hypertension    Tuberculosis    Social History   Socioeconomic History   Marital status: Married    Spouse name: Not on file   Number of children: Not on file   Years of education: Not on file   Highest education level: Not on file  Occupational History   Not on file  Tobacco Use   Smoking status:  Every Day    Types: Cigars   Smokeless tobacco: Former   Tobacco comments:    Smokes 1 cigar/day.    Quit smoking cigarettes as of office visit on 09/30/2021.  Vaping Use   Vaping status: Former   Substances: Nicotine  Substance and Sexual Activity   Alcohol use: Not Currently   Drug use: Not Currently    Types: "Crack" cocaine   Sexual activity: Not Currently    Comment: declined condoms  Other Topics Concern   Not on file  Social History Narrative   Not on file   Social Drivers of Health   Financial Resource Strain: Low Risk  (09/28/2022)   Received from [facility], [facility]   Overall Financial Resource Strain (CARDIA)    Difficulty of Paying Living Expenses: Not hard at all  Food Insecurity: No Food Insecurity (09/28/2022)   Received from [facility], [facility]   Hunger Vital Sign    Worried About Running Out of Food in the Last Year: Never true    Ran Out of Food in the Last Year: Never true  Transportation Needs: No Transportation Needs (09/28/2022)   Received from [facility], [facility]   PRAPARE - Transportation    Lack of Transportation (Medical): No    Lack of Transportation (Non-Medical): No  Physical Activity: Not on file  Stress: Not on file (06/01/2023)  Social Connections: Unknown (02/17/2022)   Received from [facility]   Social Network    Social Network: Not on file  Intimate Partner Violence: Unknown (02/17/2022)   Received from [facility], [facility]   HITS    Physically Hurt: Not on file    Insult or Talk Down To: Not on file    Threaten Physical Harm: Not on file    Scream or Curse: Not on file   History reviewed. No pertinent family history. No Known Allergies  Review of Systems  Constitutional: Negative.   HENT: Negative.    Eyes: Negative.   Respiratory:  Negative for shortness of breath.   Cardiovascular:  Negative for chest pain.  Gastrointestinal: Negative.   Genitourinary: Negative.    Musculoskeletal:  Positive for back pain, joint pain and myalgias.  Skin: Negative.   Neurological: Negative.   Endo/Heme/Allergies: Negative.   Psychiatric/Behavioral: Negative.        Objective:     BP 119/81 (BP Location: Left Arm, Patient Position: Sitting, Cuff Size: Large)   Pulse 76   Ht 5\' 10"  (1.778 m)   Wt 195 lb 3.2 oz (88.5 kg)   SpO2 94%   BMI 28.01 kg/m  BP Readings from Last 3 Encounters:  11/28/23 119/81  11/14/23 133/85  10/10/23 (!) 149/90   Wt Readings from Last 3 Encounters:  11/28/23 195 lb 3.2 oz (88.5 kg)  11/14/23 196 lb 6.4 oz (89.1 kg)  10/10/23 176 lb (79.8 kg)    Physical Exam Vitals and nursing note reviewed.    GENERAL: Alert, cooperative, well developed, no acute distress HEENT: Normocephalic, normal oropharynx, moist mucous membranes CHEST: Clear to auscultation bilaterally, no wheezes, rhonchi, or crackles CARDIOVASCULAR: Normal heart rate and rhythm, S1 and S2 normal without murmurs ABDOMEN: Soft, non-tender, non-distended, without organomegaly, normal bowel sounds EXTREMITIES: No cyanosis or edema NEUROLOGICAL: Cranial nerves grossly intact, moves all extremities without gross motor or sensory deficit   Assessment & Plan:   Problem List Items Addressed This Visit       Cardiovascular and Mediastinum   HTN (hypertension)   Relevant Medications   amLODipine  (NORVASC ) 10 MG tablet   metoprolol  succinate (TOPROL -XL) 25 MG 24 hr tablet     Digestive   Gastroesophageal reflux disease without esophagitis   Relevant Medications   famotidine  (PEPCID ) 40 MG tablet     Other   Chronic right-sided low back pain without sciatica   Relevant Medications   meloxicam  (MOBIC ) 15 MG tablet   Other Relevant Orders   Ambulatory referral to Orthopedic Surgery   Chronic right shoulder pain - Primary   Relevant Medications   meloxicam  (MOBIC ) 15 MG tablet   Other Relevant Orders   Ambulatory referral to Orthopedic Surgery   Other Visit  Diagnoses       Screen for colon cancer       Relevant Orders   Ambulatory referral to Gastroenterology       Chronic lower back pain / right shoulder pain Chronic lower back pain persists. Current management includes lidocaine  patch and meloxicam . - Initiate referral to orthopedics for further evaluation and management, including right shoulder pain.  Hypertension Hypertension well-controlled with hydrochlorothiazide  12.5 mg. Recent readings stable except one elevated last week. - Continue current regimen  - Recheck kidney function in 4-6 weeks.  Heartburn Heartburn well-managed with antacid medication. - Refill antacid medication.  Shingles Discussed shingles vaccine benefits to prevent future outbreaks and complications.  - Discuss shingles vaccine with infectious disease specialist on May 8th.  Wellness Visit Routine  wellness visit. Discussed general health maintenance, screenings, and vaccinations. Overdue for colorectal cancer screening. - Initiate referral to gastroenterology for colonoscopy. - Discuss prostate cancer screening in the fall. - Discuss shingles vaccine administration options. Follow up with PCP    I have reviewed the patient's medical history (PMH, PSH, Social History, Family History, Medications, and allergies) , and have been updated if relevant. I spent 30 minutes reviewing chart and  face to face time with patient.   Return in about 1 month (around 12/29/2023) for [facility].    Etter Hermann Mayers, PA-C

## 2023-11-28 NOTE — Patient Instructions (Signed)
 VISIT SUMMARY:  You came in today for a follow-up on your hypertension and medication management. We also discussed your fatigue, lower back and shoulder pain, heartburn, and preventive care measures including the shingles vaccine and a colonoscopy.  YOUR PLAN:  -CHRONIC LOWER BACK PAIN: Chronic lower back pain is ongoing pain in the lower back area. We will initiate a referral to orthopedics for further evaluation and management of your lower back and right shoulder pain. Continue using the lidocaine  patch for relief.  -HYPERTENSION: Hypertension is high blood pressure. Your blood pressure is generally well-controlled with your current medications. Continue taking amlodipine  10 mg, hydrochlorothiazide  12.5 mg and metoprolol  25 mg daily. We will recheck your kidney function in 4-6 weeks.  -HEARTBURN: Heartburn is a burning sensation in your chest caused by stomach acid. Your symptoms are well-managed with antacid medication, which we will refill for you.  -SHINGLES: Shingles is a viral infection that causes a painful rash. We discussed the benefits of the shingles vaccine to prevent future outbreaks. The vaccine is a two-dose series and may cause temporary discomfort. Please discuss this further with the infectious disease specialist on May 8th and determine the appropriate pharmacy for administration.  Brodie Cannon VISIT: During your routine wellness visit, we discussed general health maintenance, screenings, and vaccinations. You are overdue for a colorectal cancer screening, so we will initiate a referral to gastroenterology for a colonoscopy. We will also discuss prostate cancer screening in the fall and shingles vaccine administration options.

## 2023-12-01 ENCOUNTER — Ambulatory Visit: Admitting: Internal Medicine

## 2023-12-01 ENCOUNTER — Other Ambulatory Visit: Payer: Self-pay

## 2023-12-01 ENCOUNTER — Telehealth: Payer: Self-pay

## 2023-12-01 ENCOUNTER — Encounter: Payer: Self-pay | Admitting: Internal Medicine

## 2023-12-01 VITALS — BP 142/84 | HR 84 | Resp 16 | Ht 70.0 in | Wt 195.2 lb

## 2023-12-01 DIAGNOSIS — B20 Human immunodeficiency virus [HIV] disease: Secondary | ICD-10-CM | POA: Diagnosis present

## 2023-12-01 DIAGNOSIS — E291 Testicular hypofunction: Secondary | ICD-10-CM

## 2023-12-01 DIAGNOSIS — Z23 Encounter for immunization: Secondary | ICD-10-CM

## 2023-12-01 DIAGNOSIS — F32A Depression, unspecified: Secondary | ICD-10-CM

## 2023-12-01 MED ORDER — BICTEGRAVIR-EMTRICITAB-TENOFOV 50-200-25 MG PO TABS: 1.0000 | ORAL_TABLET | Freq: Every day | ORAL | 11 refills | Status: AC

## 2023-12-01 MED ORDER — BD PLASTIPAK SYRINGE 21G X 1" 3 ML MISC
0 refills | Status: DC
Start: 2023-12-01 — End: 2023-12-02

## 2023-12-01 MED ORDER — TESTOSTERONE CYPIONATE 100 MG/ML IM SOLN: 100.0000 mg | INTRAMUSCULAR | 3 refills | Status: DC

## 2023-12-01 NOTE — Telephone Encounter (Signed)
 Pt arrived at front desk after visit with provider and THP to schedule f/u. He did have additional questions on whether to pick up medication or if it will be delivered. I was able to advise that on his paperwork it should have the pharmacy's number for him to call to verify.   Pt used office lobby phone to call for medication, but stated that we will need to send in the script for the syringes in relation to the testosterone . I did explain to the Pt that at this time I do not have any additional clinical staff available to assist at this moment, but I will send it over once we do open back up from lunch at 12:45pm.   Routing Clinical staff that assisted w/ Pt care today. Could you please assist with the coordination of the syringes for him?

## 2023-12-01 NOTE — Addendum Note (Signed)
 Addended by: Kelechi Orgeron A on: 12/01/2023 01:15 PM   Modules accepted: Orders

## 2023-12-01 NOTE — Patient Instructions (Addendum)
 Smoking Cessation: QuitlineNC 1-800-QUIT-NOW 404 793 8582); Espaol: 1-855-Djelo-Ya (1-512-228-0703) http://carroll-castaneda.info/     Vaccine today: Meningococcal vaccine booster Shingrix shot one of two today then come back for nurse visit in around 6 weeks to get the second shot    Continue your biktarvy    Talk to your pcp about prostate and colon cancer screening; ask him to test tesoterone level given increase in dose from 100 mg every 2 weeks to 100 mg every week. Can ask him if he wants you to be monitored by endocrine    See me in 6 months     Mental Health Resources  988: can call or text 24/7  [facility]: Address: [address], [place], [place] [address] Open 24 hours Phone: [phone]  [facility] [place]: Address: [address] [place], [place] [address] Phone: [phone] Appointments: [email or web address]

## 2023-12-01 NOTE — Progress Notes (Signed)
 [facility]  Reason: HIV care f/u  Patient Active Problem List   Diagnosis Date Noted   Chronic right shoulder pain 11/16/2023   Cocaine abuse (HCC) 10/12/2023   Alcohol abuse 10/12/2023   Gastroesophageal reflux disease without esophagitis 10/12/2023   Administrative encounter 01/20/2023   Major depression 01/19/2023   Carpal tunnel syndrome of right wrist 10/19/2022   Dental caries 10/19/2022   Male hypogonadism 06/23/2022   Tobacco use 07/06/2021   Chronic right-sided low back pain without sciatica 04/06/2021   Insomnia 04/06/2021   Anxiety 04/06/2021   HIV (human immunodeficiency virus infection) (HCC) 10/21/2020   Positive PPD, treated 10/21/2020   HTN (hypertension) 10/21/2020      HPI: Paul Blake is a 58 y.o. male here for ongoing hiv care   12/01/23 id f/u Patient staying at [facility] for mental health counseling. He thinks that is very helpful for his mental breakdown Patient still takes biktarvy  without missed dose the last 4 weeks Review meds list: Amlodipine  10 Biktarvy  Famotidine  40 Htctz 12.5 Meloxicam  15 Lidoaine patch Metop 25 er Seroquel  100 Trazodone  150   He would like testoterone renewed and given weekly    05/03/23 id f/u Social - wife passed away recently 2 weeks ago. He had to do cpr on her when he found her pulseless. Patient is hanging there. Patient's wife extended family around/supportive No concern with his biktarvy  which he hasn't missed dose     11/04/22 id f/u No missed dose biktarvy  last 4 weeks He still haven't seen psych counseling. His primary care doc prescribed wellbutrin /seroquel . Appetite better. He is not taking remeron .  He started testosterone  injection 04/2022 by his pcp  Social -- he is doing side jobs now. Patient is married.   No complaint today  He is talking with his pcp numbness/tingling in right hand going up into the arm. No neck pain   05/04/22 id f/u Patient  given remeron  for poor appetite, depression sx last visit 09/2021. He thinks that is helping with sleeping/appetite. But he had seen mental health and was put on wellbutrin  150, seroquel  100 qhs, and prn gabapentin  He has a pacemaker put recently as well 02/2022 in [place]  He hasn't done labs since 02/2021. Well controlled previously on biktarvy  He has no missed dose of biktarvy  the last 4weeks  Appetite is good No anxiety/depression at this time -- happy with his regimen as above  Not currently working as of 02/2022 cause of heart block. Not on disability. Looking for new job  He wants to test testosterone  level due to poor libido/depression/and poor muscle mass   09/30/21 id f/u Doing well on biktarvy . No missed dose last 4 weeks Has labs done 08/17/21 -- reviewed; that was when he presented to urgent care with severe headache. Slightly low potassium assymptomatic. Mri/mra brain normal. No headache since that ed visit -- no new chronic medication No hiv labs done yet. Dx'ed with migraine Patient reports poor appetite. Some weight loss. I reviewed flow sheet. He has gone from 168 pounds --> 155 pounds today Patient has a cigar here and there but smoked before No blood/melena per rectum No cough/sob No hx cancer No family hx cancer Patient has a pcp in dr Paul Blake. Patient has a colonoscopy scheduled in near future  Patient doesn't have diabetes mellitus. Denies triple P of diabetes.  Any weakness/excessive heat-cold intolerance/easy bleeding bruising/hair falling out  Wife well controlled with hiv Patient doing welding still  No children between him/his current wife  He is not interested in std screen   Last 2-4 weeks more depressed... wife/work. Sleeping issue/interfere with work. No prior depression. Not on medication. No alcohol. No SI/HI. Social support system include mother/siblings -- patient closed with them. Not speaking with them much about this.  No panic attack Doesn't  think the problem will go away   No prior of sleep apnea  05/25/21 id f/u Social -- Psychologist, occupational. Lives with wife now; married 02/28/2021. Wife is HIV positive and controlled on medication Reviewed labs from 02/2021 when he missed his appointment HIV rna quant <20 and has been undetectable in our system since 09/2020 No missed dose biktarvy  last 4 weeks ROS -- no fever, chill, weight loss, headache, cough, chest pain, abd pain/n/v/diarrhea, rash, focal weakness/numbness-tingling  Has 3-4 days right armpit region tender nodule. Just changed deodorant recently when it occurs. 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 He is frustrated waiting too long for the licence to come    He first saw me on 10/09/2020 [place] ----------------------------------------- background He is currently well controlled on prezcobix/descovy  #hiv -dx'ed 1991; got it from his wife; no ivdu -remembers taking bactrim for a long time; but doesn't remember any OI history -previous HIV provider [facility] most recent prior to going to [place] ) -therapy Approximately 2016-Currently on prezcobix/descovy Doesn't recall switching ART regimen previously due to failure Previous therapy include kaletra, atazanavir, lamivudine, truvada, combivir, azt -still have medication from prison  #positive quantiferon gold testing/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 Took inh  for 6-9 months 1980s -currently no cough, chest pain, sob -endorse some weight loss (185 lbs --> 169 lbs since prison) - he thinks stress/food related from prison -had good appetite before, but not as good since prison time, lots of stress now looking for place to stay/job -no fever/chill/nightsweat, rash -have chronic knees pain no new joint pain  #htn -taking hctz -continue to see pcp dr Paul Blake   #social -he was recently incarcerated in [place]  (2020-2021) prior to transfer here -his wife passed away from AIDS 1990s -just relocated from [place]  08/2020 -no relative here; staying at [employer] -in the process of getting a job; he previously drive trucks in [place]  -not in active sexual relationship at this time -smoking; no etoh; no street drugs  Review of Systems: ROS Negative other ROS      PMH: htn hiv  Social History   Tobacco Use   Smoking status: Former    Current packs/day: 0.50    Types: Cigars, Cigarettes    Passive exposure: Never   Smokeless tobacco: Former   Tobacco comments:    Smokes 1 cigar/day.    Quit smoking cigarettes    Vaping sometimes as of 12/01/23  Vaping Use   Vaping status: Some Days   Substances: Nicotine, Flavoring  Substance Use Topics   Alcohol use: Not Currently  Drug use: Not Currently    Types: "Crack" cocaine    Fam hx: No cancer, dm2, rheumatologic disease  OBJECTIVE: Vitals:   12/01/23 1033  BP: (!) 142/82  Pulse: 84  Resp: 16  SpO2: 98%  Height: 5\' 10"  (1.778 m)    Body mass index is 28.01 kg/m.   Exam: General/constitutional: no distress, pleasant HEENT: Normocephalic, PER, Conj Clear, EOMI, Oropharynx clear Neck supple CV: rrr no mrg Lungs: clear to auscultation, normal respiratory effort Abd: Soft, Nontender Ext: no edema Skin: No Rash Neuro: nonfocal MSK: no peripheral joint swelling/tenderness/warmth; back spines nontender          Lab: Lab Results  Component Value Date   WBC 4.5  11/04/2022   HGB 16.3 11/04/2022   HCT 48.5 11/04/2022   MCV 84.8 11/04/2022   PLT 164 11/04/2022     Chemistry      Component Value Date/Time   NA 141 11/04/2022 1107   NA 140 06/23/2022 1019   K 3.6 11/04/2022 1107   CL 105 11/04/2022 1107   CO2 27 11/04/2022 1107   BUN 18 11/04/2022 1107   BUN 15 06/23/2022 1019   CREATININE 1.01 11/04/2022 1107      Component Value Date/Time   CALCIUM 10.2 11/04/2022 1107   ALKPHOS 117 06/23/2022 1019   AST 20 11/04/2022 1107   ALT 17 11/04/2022 1107   BILITOT 0.4 11/04/2022 1107   BILITOT 0.5 06/23/2022 1019     Lab Results  Component Value Date   CHOL 210 (H) 09/25/2020   HDL 55 09/25/2020   LDLCALC 129 (H) 09/25/2020   TRIG 144 09/25/2020   CHOLHDL 3.8 09/25/2020    hiv          vl      /     cd4 (%) 10/2022  80 04/2022  UD    /     439 (21%) 02/2021    UD 09/25/20   UD    /     283 (18) 08/220    166 2020-2021 UD with blips here and there  Microbiology:  Serology: 02/2021 rpr negative  09/25/2020 Quant gold Positive Urine gc/chlam negative rpr nonreactive Hep c Ab, hep b sAg/sAb/cAb nonreactive Hep a Ab reactive  Imaging:   Assessment/plan: Problem List Items Addressed This Visit   None   #hiv #cad/cva  Heterosexual transmission Ryan White patient therapy prezcobix, descovy no prior VF. No prior genotype known. No prior ISI used prior to my 09/2020 visit  Switched to biktarvy  09/2020  Well controlled/compliant with biktarvy   12/01/23 doing well on biktarvy     -discussed u=u -encourage compliance -continue current HIV medication -labs 6 months will do labs then -f/u 6 months -discussed reprieve trial and he'll think about statin     #depression vs adjustment #weight loss #poor appetite On seroquel , wellbutrin , and prn gabapentin  within mid Dec 28, 2021 and doing well 05/03/23 on same medication including trazodone .  Wife passed away late 12-29-22  -[facility]; doing ok; no  si/hi -continue meds as is    #question of androgen deficiency In setting of somatic sx and depression as above 04/2022 testosterone  level 177 with normal sex hormone binding globulin. He is seeing primary care but not sure if able to get in soon   -refill his meds test cyp 100 mg/ml every 1 week increased dose from every 2 weeks; 04/2023 testosterone  total level was on the lower side -f/u pcp -- advise him to check  level with the increased dose on next visit      #htn #cad risk #sinus brady s/p pacer 02/2022 in [place]  Patient has a PCP. 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  Jamesetta Mcbride, MD [facility] [phone] pager   [phone] cell 12/01/2023, 10:44 AM

## 2023-12-01 NOTE — Telephone Encounter (Signed)
 Diminique sent in syringes and needles to pharmacy. Thanks

## 2023-12-02 ENCOUNTER — Other Ambulatory Visit: Payer: Self-pay

## 2023-12-02 MED ORDER — BD PLASTIPAK SYRINGE 21G X 1" 3 ML MISC: 0 refills | Status: DC

## 2023-12-03 LAB — HIV-1 RNA QUANT-NO REFLEX-BLD
HIV 1 RNA Quant: <20 {copies}/mL — AB
HIV-1 RNA Quant, Log: <1.3 {Log_copies}/mL — AB

## 2023-12-03 LAB — COMPLETE METABOLIC PANEL WITHOUT GFR
AG Ratio: 1.7 (calc) (ref 1.0–2.5)
ALT: 26 U/L (ref 9–46)
AST: 20 U/L (ref 10–35)
Albumin: 4.8 g/dL (ref 3.6–5.1)
Alkaline phosphatase (APISO): 75 U/L (ref 35–144)
BUN: 13 mg/dL (ref 7–25)
CO2: 30 mmol/L (ref 20–32)
Calcium: 10.1 mg/dL (ref 8.6–10.3)
Chloride: 104 mmol/L (ref 98–110)
Creat: 0.99 mg/dL (ref 0.70–1.30)
Globulin: 2.9 g/dL (ref 1.9–3.7)
Glucose, Bld: 80 mg/dL (ref 65–99)
Potassium: 3.8 mmol/L (ref 3.5–5.3)
Sodium: 141 mmol/L (ref 135–146)
Total Bilirubin: 0.4 mg/dL (ref 0.2–1.2)
Total Protein: 7.7 g/dL (ref 6.1–8.1)

## 2023-12-03 LAB — CBC
HCT: 44.1 % (ref 38.5–50.0)
Hemoglobin: 14.5 g/dL (ref 13.2–17.1)
MCH: 28 pg (ref 27.0–33.0)
MCHC: 32.9 g/dL (ref 32.0–36.0)
MCV: 85.1 fL (ref 80.0–100.0)
MPV: 10.7 fL (ref 7.5–12.5)
Platelets: 214 10*3/uL (ref 140–400)
RBC: 5.18 Million/uL (ref 4.20–5.80)
RDW: 14.8 % (ref 11.0–15.0)
WBC: 5.6 10*3/uL (ref 3.8–10.8)

## 2023-12-03 LAB — T-HELPER CELLS (CD4) COUNT (NOT AT ARMC)
Absolute CD4: 501 {cells}/uL (ref 490–1740)
CD4 T Helper %: 17 % — ABNORMAL LOW (ref 30–61)
Total lymphocyte count: 2968 {cells}/uL (ref 850–3900)

## 2023-12-06 ENCOUNTER — Other Ambulatory Visit (INDEPENDENT_AMBULATORY_CARE_PROVIDER_SITE_OTHER): Payer: Self-pay

## 2023-12-06 ENCOUNTER — Encounter: Payer: Self-pay | Admitting: Physician Assistant

## 2023-12-06 ENCOUNTER — Ambulatory Visit (INDEPENDENT_AMBULATORY_CARE_PROVIDER_SITE_OTHER): Admitting: Physician Assistant

## 2023-12-06 DIAGNOSIS — G8929 Other chronic pain: Secondary | ICD-10-CM

## 2023-12-06 DIAGNOSIS — M545 Low back pain, unspecified: Secondary | ICD-10-CM

## 2023-12-06 DIAGNOSIS — M25511 Pain in right shoulder: Secondary | ICD-10-CM

## 2023-12-06 MED ORDER — METHYLPREDNISOLONE ACETATE 40 MG/ML IJ SUSP
40.0000 mg | INTRAMUSCULAR | Status: AC | PRN
  Administered 2023-12-06: 40 mg via INTRA_ARTICULAR

## 2023-12-06 MED ORDER — LIDOCAINE HCL 1 % IJ SOLN
3.0000 mL | INTRAMUSCULAR | Status: AC | PRN
  Administered 2023-12-06: 3 mL

## 2023-12-06 NOTE — Progress Notes (Signed)
 Office Visit Note   Patient: Paul Blake           Date of Birth: 12/17/1965           MRN: [id number] Visit Date: 12/06/2023              Requested by: Mayers, Etter Hermann, PA-C [address] [place],  [place] [address] PCP: Vernell Goldsmith, MD   Assessment & Plan: Visit Diagnoses:  1. Chronic right shoulder pain   2. Lumbar pain     Plan: Patient is a pleasant 58 year old gentleman who comes in today for a 2-year history of right shoulder pain.  It hurts especially when he lifts his shoulder.  Did do a lot of painting thinks this may have caused it.  He has not really had much treatment for it he has been taking meloxicam  which does not really help.  Exam today is findings and consistent with a little AC arthritis impingement I talked about physical therapy or trying an injection.  He does want to try an injection today.  Also has a very long history of low back pain of almost 30 years.  He denies any paresthesias.  He has tried physical therapy has not tried any injections.  Was on chronic pain management in [place] .  Does go down his right side he is neurologically intact.  We talked about possibly trying injections in his back he would have to get a CT scan as he has a pacemaker.  He said he would like to defer this for now he is working on getting into chronic pain management we will go forward with an injection of his right shoulder today.  He does have a history of HIV disease but sees infectious disease and is well-controlled with medication  Follow-Up Instructions: No follow-ups on file.   Orders:  Orders Placed This Encounter  Procedures   XR Lumbar Spine 2-3 Views   XR Shoulder Right   No orders of the defined types were placed in this encounter.     Procedures: Large Joint Inj: R subacromial bursa on 12/06/2023 11:33 AM Indications: diagnostic evaluation and pain Details: 25 G 1.5 in needle, posterior approach  Arthrogram: No  Medications: 3 mL lidocaine  1  %; 40 mg methylPREDNISolone acetate 40 MG/ML Outcome: tolerated well, no immediate complications Procedure, treatment alternatives, risks and benefits explained, specific risks discussed. Consent was given by the patient.       Clinical Data: No additional findings.   Subjective: No chief complaint on file.   HPI pleasant gentleman with a history of right shoulder pain x 2 years and chronic low back pain.  Has been taking meloxicam .  In the past has treated the back with massage acupuncture and physical therapy which was not helpful.  She will need has pain that goes slightly down his right leg.  Right shoulder has been bothering him for couple years after doing a lot of painting  Review of Systems  All other systems reviewed and are negative.    Objective: Vital Signs: There were no vitals taken for this visit.  Physical Exam Constitutional:      Appearance: Normal appearance.  Pulmonary:     Effort: Pulmonary effort is normal.  Skin:    General: Skin is warm and dry.  Neurological:     General: No focal deficit present.     Mental Status: He is alert and oriented to person, place, and time.  Psychiatric:  Mood and Affect: Mood normal.        Behavior: Behavior normal.    Ortho Exam Examination of his low back no step-off lower back pain radiating to the right side he has excellent strength with resisted dorsiflexion plantarflexion extension flexion of his legs no radicular findings. Shoulder he has pain with forward elevation coming down and internal rotation behind his back strength is good with abduction internal/external rotation he does have a positive empty can test mildly positive speeds test and positive impingement findings. Specialty Comments:  No specialty comments available.  Imaging: XR Lumbar Spine 2-3 Views Result Date: 12/06/2023 Radiographs of his lumbar spine no acute fractures noted overall well-maintained spacing.  Early degenerative changes  very similar to x-rays taken a few years ago  XR Shoulder Right Result Date: 12/06/2023 Radiographs of the right shoulder do demonstrate some degenerative changes of the [facility] joint shoulder is well located no acute changes    PMFS History: Patient Active Problem List   Diagnosis Date Noted   Chronic right shoulder pain 11/16/2023   Cocaine abuse (HCC) 10/12/2023   Alcohol abuse 10/12/2023   Gastroesophageal reflux disease without esophagitis 10/12/2023   Administrative encounter 01/20/2023   Major depression 01/19/2023   Carpal tunnel syndrome of right wrist 10/19/2022   Dental caries 10/19/2022   Male hypogonadism 06/23/2022   Tobacco use 07/06/2021   Chronic right-sided low back pain without sciatica 04/06/2021   Insomnia 04/06/2021   Anxiety 04/06/2021   HIV (human immunodeficiency virus infection) (HCC) 10/21/2020   Positive PPD, treated 10/21/2020   HTN (hypertension) 10/21/2020   Past Medical History:  Diagnosis Date   Hypertension    Tuberculosis     History reviewed. No pertinent family history.  Past Surgical History:  Procedure Laterality Date   CARDIAC PACEMAKER PLACEMENT Left 12/2021   no past surgery      Social History   Occupational History   Not on file  Tobacco Use   Smoking status: Former    Current packs/day: 0.50    Types: Cigars, Cigarettes    Passive exposure: Never   Smokeless tobacco: Former   Tobacco comments:    Smokes 1 cigar/day.    Quit smoking cigarettes    Vaping sometimes as of 12/01/23  Vaping Use   Vaping status: Some Days   Substances: Nicotine, Flavoring  Substance and Sexual Activity   Alcohol use: Not Currently   Drug use: Not Currently    Types: "Crack" cocaine   Sexual activity: Not Currently    Comment: declined condoms

## 2023-12-08 NOTE — Progress Notes (Signed)
 The 10-year ASCVD risk score (Arnett DK, et al., 2019) is: 13.1%   Values used to calculate the score:     Age: 58 years     Sex: Male     Is Non-Hispanic African American: Yes     Diabetic: No     Tobacco smoker: No     Systolic Blood Pressure: 142 mmHg     Is BP treated: Yes     HDL Cholesterol: 75 mg/dL     Total Cholesterol: 220 mg/dL  No current statin therapy, next appointment note updated.   Sandro Burgo, BSN, RN

## 2023-12-28 NOTE — Progress Notes (Unsigned)
 Established Patient Office Visit  Subjective:  Patient ID: Paul Blake, male    DOB: 08-05-1965  Age: 58 y.o. MRN: [id number]   06/2021 Paul Blake presents for primary care follow-up visit.  Patient history of HIV and hypertension.  On arrival blood pressures 110/75.  Patient maintains valsartan  HCT 160/25.  Patient also is on the omeprazole  daily and Biktarvy .  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  Patient does have housing at this time he lives with his fiance.  Note we had x-rayed his lumbar previously was normal.  06/23/22 Patient seen in return follow-up has not been seen since December 2022.  He was seeing a primary care doctor in the [place] area but now is moved back to [place].  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  He does have a pacemaker recheck with a [facility] cardiologist upcoming later today.  He does have chronic back pain would like pain management.  He is smoking 1 cigar 3 times a week.  On arrival blood pressure was elevated 154/96.  He needs a colonoscopy and agrees to receive this.  He has decreased sex drive.  Follows with HIV clinic here and currently is under good control with viral load.  10/19/22 This patient is seen in return follow-up last seen in November of last year.  The patient does have HIV and is on Biktarvy  needing refills.  He complains of tingling in the right hand and arm as well this has been a chronic issue.  On arrival blood pressure is good 125/78.  Patient would benefit from a home blood pressure meter.  He also needs colon cancer screening.  Patient now has housing.  He is smoking 1 black and mild every 2 days.  He needs refills on multiple medications.  He is yet to achieve a dental appointment we thought the ID clinic would do  this they were not able to do so we need to refer him out.  01/19/23 Since the last visit in March the patient had an episode where he became very disengaged blacking out on the job.  He was not sent to the hospital and was not seen by medical provider.  He has had a history of severe depression and disengagement at work.  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  The patient had been using crack cocaine as well but no evidence of adult overdose and other drugs but again no screening studies were done.  He was out of work from 2 June through 10 June and then when he tried to return to work on 11 June his workplace insisted he go into rehab.  He elected to go to an inpatient residential rehab in [place]  and this is where he resides now.  He did take all of his medications with him.  Today he needs [employer] papers filled out for work. Care everywhere where is reviewed and medications from the rehab therapy has indicated the following new medications clonidine point 1 mg every hour as needed, gabapentin  600 mg 3 times daily as needed, hydroxyzine  10 mg 3 times daily diet, mirtazapine  15 mg at bedtime, Valium 10 mg 3 times daily, Seroquel  100 mg daily, he continues with bupropion  150 mg daily.  Abilify 2 mg nightly.  12/28/23 Patient seen in return follow-up currently stays at the [facility] rehab unit here in the city for alcohol rehab.  He is about to be discharged from this program and will be looking for housing.  He comes in with chronic pain he is seeing a pain physician who is about to prescribe opiates for him once he is out of the rehab unit.  His discharge date from rehab is 17 June.  He needs a pneumonia vaccine he agrees to receive it he also needs a colonoscopy he agrees to receive this  Patient is followed by infectious disease being managed with Biktarvy .  History of hypertension blood pressure on arrival excellent 123/75.  He does have dental caries and needs a dental referral he does have  Medicaid  Patient still tobacco smoking to some degree there are no other complaints.  Past Medical History:  Diagnosis Date   Hypertension    Tuberculosis     Past Surgical History:  Procedure Laterality Date   CARDIAC PACEMAKER PLACEMENT Left 12/2021   no past surgery       History reviewed. No pertinent family history.  Social History   Socioeconomic History   Marital status: Married    Spouse name: Not on file   Number of children: Not on file   Years of education: Not on file   Highest education level: Not on file  Occupational History   Not on file  Tobacco Use   Smoking status: Former    Current packs/day: 0.50    Types: Cigars, Cigarettes    Passive exposure: Never   Smokeless tobacco: Former   Tobacco comments:    Smokes 1 cigar/day.    Quit smoking cigarettes    Vaping sometimes as of 12/01/23  Vaping Use   Vaping status: Some Days   Substances: Nicotine, Flavoring  Substance and Sexual Activity   Alcohol use: Not Currently   Drug use: Not Currently    Types: "Crack" cocaine   Sexual activity: Not Currently    Comment: declined condoms  Other Topics Concern   Not on file  Social History Narrative   Not on file   Social Drivers of Health   Financial Resource Strain: Low Risk  (09/28/2022)   Received from [facility], [facility]   Overall Financial Resource Strain (CARDIA)    Difficulty of Paying Living Expenses: Not hard at all  Food Insecurity: No Food Insecurity (09/28/2022)   Received from [facility], [facility]   Hunger Vital Sign    Worried About Running Out of Food in the Last Year: Never true    Ran Out of Food in the Last Year: Never true  Transportation Needs: No Transportation Needs (09/28/2022)   Received from [facility], [facility]   PRAPARE - Transportation    Lack of Transportation (Medical): No    Lack of Transportation (Non-Medical): No  Physical Activity: Not on file  Stress: Not on file (06/01/2023)  Social  Connections: Unknown (02/17/2022)   Received from [facility]   Social Network    Social Network: Not on file  Intimate Partner Violence: Unknown (02/17/2022)   Received from [facility], [facility]   HITS    Physically Hurt: Not on file    Insult or Talk Down To: Not on file    Threaten Physical Harm: Not on file    Scream or Curse: Not on file    Outpatient Medications Prior to Visit  Medication Sig Dispense Refill  bictegravir-emtricitabine-tenofovir AF (BIKTARVY ) 50-200-25 MG TABS tablet Take 1 tablet by mouth daily. 30 tablet 11   buPROPion  (WELLBUTRIN  XL) 150 MG 24 hr tablet Take 150 mg by mouth daily.     FLUoxetine (PROZAC) 20 MG tablet Take 20 mg by mouth daily.     hydrOXYzine  (ATARAX ) 10 MG tablet Take 10 mg by mouth 3 (three) times daily as needed.     lidocaine  4 % Place 1 patch onto the skin 2 (two) times daily. 60 patch 1   meloxicam  (MOBIC ) 15 MG tablet Take 1 tablet (15 mg total) by mouth daily. 30 tablet 2   QUEtiapine  (SEROQUEL ) 100 MG tablet Take 100 mg by mouth at bedtime.     SYRINGE-NEEDLE, DISP, 3 ML (BD PLASTIPAK SYRINGE) 21G X 1" 3 ML MISC Use to inject 1 ml of testosterone  IM every 7 days 50 each 0   testosterone  cypionate (DEPOTESTOTERONE CYPIONATE) 100 MG/ML injection Inject 1 mL (100 mg total) into the muscle every 7 (seven) days. For IM use only 12 mL 3   traZODone  (DESYREL ) 50 MG tablet TAKE 1 TABLET(50 MG) BY MOUTH AT BEDTIME AS NEEDED FOR SLEEP 90 tablet 0   famotidine  (PEPCID ) 40 MG tablet Take 1 tablet (40 mg total) by mouth 2 (two) times daily. 60 tablet 2   hydrochlorothiazide  (HYDRODIURIL ) 12.5 MG tablet Take 1 tablet (12.5 mg total) by mouth daily. 30 tablet 1   metoprolol  succinate (TOPROL -XL) 25 MG 24 hr tablet Take 1 tablet (25 mg total) by mouth daily. 30 tablet 2   Blood Pressure Monitoring (BLOOD PRESSURE KIT) DEVI Use to measure blood pressure (Patient not taking: Reported on 05/03/2023) 1 each 0   amLODipine  (NORVASC ) 10  MG tablet Take 1 tablet (10 mg total) by mouth daily. 30 tablet 2   No facility-administered medications prior to visit.    No Known Allergies  ROS Review of Systems  Constitutional: Negative.  Negative for chills, diaphoresis and fever.  HENT:  Negative for congestion, dental problem, ear pain, hearing loss, nosebleeds, postnasal drip, rhinorrhea, sinus pressure, sore throat, tinnitus, trouble swallowing and voice change.   Eyes: Negative.  Negative for photophobia and redness.  Respiratory: Negative.  Negative for apnea, cough, choking, chest tightness, shortness of breath, wheezing and stridor.   Cardiovascular: Negative.  Negative for chest pain, palpitations and leg swelling.  Gastrointestinal: Negative.  Negative for abdominal distention, abdominal pain, blood in stool, constipation, diarrhea, nausea and vomiting.  Endocrine: Negative for polydipsia.  Genitourinary: Negative.  Negative for dysuria, flank pain, frequency, hematuria and urgency.  Musculoskeletal:  Negative for arthralgias, back pain, myalgias and neck pain.  Skin: Negative.  Negative for rash.  Allergic/Immunologic: Negative.  Negative for environmental allergies and food allergies.  Neurological: Negative.  Negative for dizziness, tremors, seizures, syncope, weakness and headaches.  Hematological: Negative.  Negative for adenopathy. Does not bruise/bleed easily.  Psychiatric/Behavioral:  Positive for dysphoric mood. Negative for agitation, sleep disturbance and suicidal ideas. The patient is not nervous/anxious.       Objective:     Vitals:   12/29/23 1037  BP: 123/75  Pulse: 75  Temp: 97.7 F (36.5 C)  TempSrc: Oral  SpO2: 95%  Weight: 205 lb (93 kg)  Height: 5\' 10"  (1.778 m)    Gen: Pleasant, well-nourished, in no distress,  normal affect  ENT: No lesions,  mouth clear,  oropharynx clear, no postnasal drip Poor dentition  Neck: No JVD, no TMG, no carotid bruits  Lungs: No use of  accessory  muscles, no dullness to percussion, clear without rales or rhonchi  Cardiovascular: RRR, heart sounds normal, no murmur or gallops, no peripheral edema  Abdomen: soft and NT, no HSM,  BS normal  Musculoskeletal: No deformities, no cyanosis or clubbing  Neuro: alert, non focal  Skin: Warm, no lesions or rashes  No results found.  BP 123/75 (BP Location: Left Arm, Patient Position: Sitting, Cuff Size: Normal)   Pulse 75   Temp 97.7 F (36.5 C) (Oral)   Ht 5\' 10"  (1.778 m)   Wt 205 lb (93 kg)   SpO2 95%   BMI 29.41 kg/m  Wt Readings from Last 3 Encounters:  12/29/23 205 lb (93 kg)  12/01/23 195 lb 3.2 oz (88.5 kg)  11/28/23 195 lb 3.2 oz (88.5 kg)     Health Maintenance Due  Topic Date Due   COLON CANCER SCREENING ANNUAL FOBT  07/29/2021   COVID-19 Vaccine (6 - Moderna risk 2024-25 season) 11/01/2023    There are no preventive care reminders to display for this patient.  No results found for: "TSH" Lab Results  Component Value Date   WBC 5.6 12/01/2023   HGB 14.5 12/01/2023   HCT 44.1 12/01/2023   MCV 85.1 12/01/2023   PLT 214 12/01/2023   Lab Results  Component Value Date   NA 141 12/01/2023   K 3.8 12/01/2023   CO2 30 12/01/2023   GLUCOSE 80 12/01/2023   BUN 13 12/01/2023   CREATININE 0.99 12/01/2023   BILITOT 0.4 12/01/2023   ALKPHOS 117 06/23/2022   AST 20 12/01/2023   ALT 26 12/01/2023   PROT 7.7 12/01/2023   ALBUMIN 5.2 (H) 06/23/2022   CALCIUM 10.1 12/01/2023   ANIONGAP 9 08/17/2021   EGFR 88 11/04/2022   Lab Results  Component Value Date   CHOL 210 (H) 09/25/2020   Lab Results  Component Value Date   HDL 55 09/25/2020   Lab Results  Component Value Date   LDLCALC 129 (H) 09/25/2020   Lab Results  Component Value Date   TRIG 144 09/25/2020   Lab Results  Component Value Date   CHOLHDL 3.8 09/25/2020   No results found for: "HGBA1C"    Assessment & Plan:   Problem List Items Addressed This Visit       Cardiovascular and  Mediastinum   HTN (hypertension)   Hypertension well-controlled refill medications      Relevant Medications   amLODipine  (NORVASC ) 10 MG tablet   hydrochlorothiazide  (HYDRODIURIL ) 12.5 MG tablet   metoprolol  succinate (TOPROL -XL) 25 MG 24 hr tablet     Digestive   Dental caries   Referral to dentistry      Gastroesophageal reflux disease without esophagitis   Relevant Medications   famotidine  (PEPCID ) 40 MG tablet   Other Visit Diagnoses       Colon cancer screening    -  Primary   Relevant Orders   Ambulatory referral to Gastroenterology     Mixed hyperlipidemia       Relevant Medications   amLODipine  (NORVASC ) 10 MG tablet   hydrochlorothiazide  (HYDRODIURIL ) 12.5 MG tablet   metoprolol  succinate (TOPROL -XL) 25 MG 24 hr tablet   Other Relevant Orders   Lipid panel     Need for Streptococcus pneumoniae vaccination       Relevant Orders   Pneumococcal conjugate vaccine 20-valent (Completed)      Meds ordered this encounter  Medications   DISCONTD: amLODipine  (NORVASC ) 10 MG tablet    Sig:  Take 1 tablet (10 mg total) by mouth daily.    Dispense:  90 tablet    Refill:  2    Future refill   DISCONTD: famotidine  (PEPCID ) 40 MG tablet    Sig: Take 1 tablet (40 mg total) by mouth 2 (two) times daily.    Dispense:  60 tablet    Refill:  2   DISCONTD: hydrochlorothiazide  (HYDRODIURIL ) 12.5 MG tablet    Sig: Take 1 tablet (12.5 mg total) by mouth daily.    Dispense:  90 tablet    Refill:  1    Future refill   DISCONTD: metoprolol  succinate (TOPROL -XL) 25 MG 24 hr tablet    Sig: Take 1 tablet (25 mg total) by mouth daily.    Dispense:  90 tablet    Refill:  2    Future refill   amLODipine  (NORVASC ) 10 MG tablet    Sig: Take 1 tablet (10 mg total) by mouth daily.    Dispense:  90 tablet    Refill:  2    Future refill   famotidine  (PEPCID ) 40 MG tablet    Sig: Take 1 tablet (40 mg total) by mouth 2 (two) times daily.    Dispense:  60 tablet    Refill:  2    hydrochlorothiazide  (HYDRODIURIL ) 12.5 MG tablet    Sig: Take 1 tablet (12.5 mg total) by mouth daily.    Dispense:  90 tablet    Refill:  1    Future refill   metoprolol  succinate (TOPROL -XL) 25 MG 24 hr tablet    Sig: Take 1 tablet (25 mg total) by mouth daily.    Dispense:  90 tablet    Refill:  2    Future refill   Follow-up: Return in about 6 months (around 06/29/2024) for htn, primary care follow up.    Arlene Lacy, MD

## 2023-12-29 ENCOUNTER — Encounter: Payer: Self-pay | Admitting: Critical Care Medicine

## 2023-12-29 ENCOUNTER — Ambulatory Visit: Payer: MEDICAID | Attending: Critical Care Medicine | Admitting: Critical Care Medicine

## 2023-12-29 VITALS — BP 123/75 | HR 75 | Temp 97.7°F | Ht 70.0 in | Wt 205.0 lb

## 2023-12-29 DIAGNOSIS — K029 Dental caries, unspecified: Secondary | ICD-10-CM | POA: Diagnosis not present

## 2023-12-29 DIAGNOSIS — E782 Mixed hyperlipidemia: Secondary | ICD-10-CM | POA: Diagnosis not present

## 2023-12-29 DIAGNOSIS — K219 Gastro-esophageal reflux disease without esophagitis: Secondary | ICD-10-CM | POA: Diagnosis not present

## 2023-12-29 DIAGNOSIS — Z23 Encounter for immunization: Secondary | ICD-10-CM

## 2023-12-29 DIAGNOSIS — Z1211 Encounter for screening for malignant neoplasm of colon: Secondary | ICD-10-CM

## 2023-12-29 DIAGNOSIS — I10 Essential (primary) hypertension: Secondary | ICD-10-CM

## 2023-12-29 MED ORDER — FAMOTIDINE 40 MG PO TABS: 40.0000 mg | ORAL_TABLET | Freq: Two times a day (BID) | ORAL | 2 refills | Status: DC

## 2023-12-29 MED ORDER — METOPROLOL SUCCINATE ER 25 MG PO TB24: 25.0000 mg | ORAL_TABLET | Freq: Every day | ORAL | 2 refills | Status: DC

## 2023-12-29 MED ORDER — AMLODIPINE BESYLATE 10 MG PO TABS: 10.0000 mg | ORAL_TABLET | Freq: Every day | ORAL | 2 refills | Status: DC

## 2023-12-29 MED ORDER — HYDROCHLOROTHIAZIDE 12.5 MG PO TABS: 12.5000 mg | ORAL_TABLET | Freq: Every day | ORAL | 1 refills | Status: DC

## 2023-12-29 MED ORDER — AMLODIPINE BESYLATE 10 MG PO TABS
10.0000 mg | ORAL_TABLET | Freq: Every day | ORAL | 2 refills | Status: DC
Start: 2023-12-29 — End: 2023-12-29

## 2023-12-29 NOTE — Assessment & Plan Note (Signed)
Referral to dentistry 

## 2023-12-29 NOTE — Assessment & Plan Note (Signed)
 Hypertension well-controlled refill medications

## 2023-12-29 NOTE — Patient Instructions (Signed)
 Medications refilled sent to your pharmacy on refills will be 90-day supplies  Colonoscopy will be ordered they will call you to schedule  Medicaid list of dentist that take Medicaid was given I circled the ones that I prefer  Pneumonia vaccine was given  Labs today are a cholesterol panel  Return for primary care 6 months

## 2023-12-30 ENCOUNTER — Encounter: Payer: Self-pay | Admitting: Critical Care Medicine

## 2023-12-30 ENCOUNTER — Ambulatory Visit: Payer: Self-pay | Admitting: Critical Care Medicine

## 2023-12-30 DIAGNOSIS — E782 Mixed hyperlipidemia: Secondary | ICD-10-CM | POA: Insufficient documentation

## 2023-12-30 LAB — LIPID PANEL
Chol/HDL Ratio: 4.6 ratio (ref 0.0–5.0)
Cholesterol, Total: 194 mg/dL (ref 100–199)
HDL: 42 mg/dL (ref >39)
LDL Chol Calc (NIH): 107 mg/dL — ABNORMAL HIGH (ref 0–99)
Triglycerides: 261 mg/dL — ABNORMAL HIGH (ref 0–149)
VLDL Cholesterol Cal: 45 mg/dL — ABNORMAL HIGH (ref 5–40)

## 2023-12-30 NOTE — Progress Notes (Signed)
 Let pt know cholesterol and triglycerides slightly high   I recommend trying to follow a low fat plant based diet as much as possible medications not needed

## 2024-01-09 ENCOUNTER — Ambulatory Visit: Payer: MEDICAID | Admitting: Physician Assistant

## 2024-02-13 ENCOUNTER — Encounter: Payer: Self-pay | Admitting: Physician Assistant

## 2024-02-13 ENCOUNTER — Ambulatory Visit: Payer: MEDICAID | Admitting: Physician Assistant

## 2024-02-13 VITALS — BP 133/89 | HR 78 | Ht 70.0 in | Wt 205.0 lb

## 2024-02-13 DIAGNOSIS — R0602 Shortness of breath: Secondary | ICD-10-CM

## 2024-02-13 DIAGNOSIS — R0600 Dyspnea, unspecified: Secondary | ICD-10-CM

## 2024-02-13 DIAGNOSIS — F1010 Alcohol abuse, uncomplicated: Secondary | ICD-10-CM

## 2024-02-13 DIAGNOSIS — K219 Gastro-esophageal reflux disease without esophagitis: Secondary | ICD-10-CM

## 2024-02-13 DIAGNOSIS — F1410 Cocaine abuse, uncomplicated: Secondary | ICD-10-CM

## 2024-02-13 DIAGNOSIS — J302 Other seasonal allergic rhinitis: Secondary | ICD-10-CM

## 2024-02-13 MED ORDER — CETIRIZINE HCL 10 MG PO TABS: 10.0000 mg | ORAL_TABLET | Freq: Every day | ORAL | 11 refills | Status: DC

## 2024-02-13 MED ORDER — OMEPRAZOLE 20 MG PO CPDR: 20.0000 mg | DELAYED_RELEASE_CAPSULE | Freq: Every day | ORAL | 3 refills | Status: DC

## 2024-02-13 NOTE — Progress Notes (Unsigned)
   Established Patient Office Visit  Subjective   Patient ID: Paul Blake, male    DOB: [birth year]  Age: 58 y.o. MRN: [id number]  Chief Complaint  Patient presents with   Heartburn    Discomfort after eating, described as a burning feeling. Patient has been treated with Acid reflux medication OTC, with little to no relief    Labs Only    Discussed the use of AI scribe software for clinical note transcription with the patient, who gave verbal consent to proceed.  History of Present Illness  20 years  / 2 packs a week    HPI  {History (Optional):23778}  ROS    Objective:     BP 133/89 (BP Location: Left Arm, Patient Position: Sitting, Cuff Size: Large)   Pulse 78  {Vitals History (Optional):23777}  Physical Exam   No results found for any visits on 02/13/24.  {Labs (Optional):23779}  The 10-year ASCVD risk score (Arnett DK, et al., 2019) is: 13.3%    Assessment & Plan:   Problem List Items Addressed This Visit   None   No follow-ups on file.    Kirk RAMAN Mayers, PA-C

## 2024-02-13 NOTE — Patient Instructions (Signed)
 VISIT SUMMARY:  During your visit, we discussed your recent experiences with heartburn and shortness of breath. We reviewed your current medications and lifestyle habits to better understand and address your symptoms.  YOUR PLAN:  -GASTROESOPHAGEAL REFLUX DISEASE (GERD): GERD is a condition where stomach acid frequently flows back into the tube connecting your mouth and stomach, causing heartburn. Your current medication regimen is not fully controlling your symptoms, possibly due to the use of meloxicam . We are starting you on a low-dose PPI (Prilosec) in the mornings in addition to continuing Pepcid . We will reassess in two weeks to see if this helps, and may consider a referral to a gastroenterologist if your symptoms persist.  -DYSPNEA: Dyspnea means shortness of breath. Your symptoms could be related to allergies, asthma-like conditions, or stress, especially given your history of smoking. We are starting you on daily Zyrtec  to see if allergies are contributing to your symptoms. Please document when you experience shortness of breath and what you are doing at the time. We will review this in two weeks to determine if you need an inhaler.

## 2024-02-14 ENCOUNTER — Telehealth: Payer: Self-pay | Admitting: Critical Care Medicine

## 2024-02-14 ENCOUNTER — Telehealth: Payer: Self-pay

## 2024-02-14 NOTE — Telephone Encounter (Signed)
 Copied from CRM #[id number]. Topic: Appointments - Appointment Scheduling >> Feb 14, 2024  2:29 PM Delon DASEN wrote:  Patient need to schedule with Dr Vicci, he chose her when Dr Brien retired- [phone]

## 2024-02-14 NOTE — Telephone Encounter (Signed)
 Copied from CRM [id number]. Topic: General - Other >> Feb 14, 2024 12:57 PM Delon T wrote:  Reason for CRM: Patient needs a letter stating he is not fit for work, please fax to [phone] attention to Lean Fayson - [phone]

## 2024-02-14 NOTE — Telephone Encounter (Signed)
 Called patient but was unable to reach. Left a detailed voicemail requesting a call back.

## 2024-02-14 NOTE — Telephone Encounter (Signed)
 Patient called back, states [employer] says he doesn't need to sign an ROI. Discussed that we will need that on file before we can release his records. Notified him that [employer] can print off a [place] ROI online, fill it out, and fax it to us .  Lelia Jons, BSN, RN

## 2024-02-14 NOTE — Telephone Encounter (Signed)
 Patient returning call back.  Called patient but was unable to reach. Left a detailed voicemail requesting a call back. Noted, that patient already has an appointment scheduled with Dr.Johnson 06/29/2024 at 9:10 am.

## 2024-02-14 NOTE — Telephone Encounter (Signed)
 Copied from CRM #[id number]. Topic: Appointments - Transfer of Care >> Feb 14, 2024  2:27 PM Delon DASEN wrote:  Pt is requesting to transfer FROM: Dr Brien Pt is requesting to transfer TO: Dr Vicci Reason for requested transfer: Dr Brien retired It is the responsibility of the team the patient would like to transfer to (Dr. Vicci) to reach out to the patient if for any reason this transfer is not acceptable.

## 2024-02-14 NOTE — Telephone Encounter (Signed)
 Patient last saw Dr. Brien 12/29/2023 at 10:10 am.

## 2024-02-14 NOTE — Telephone Encounter (Signed)
 Please schedule this patient to establish care with another PCP as Dr. Brien is retired.

## 2024-02-14 NOTE — Telephone Encounter (Signed)
 Routing as [employer]

## 2024-02-14 NOTE — Telephone Encounter (Signed)
 Patient called, states he needs labs sent to his pharmacy.   Reports he is currently at [facility] and they need his recent labs in order to fill his medications. Asked him to please have [employer] fax our office an ROI so that we can share his labs. Provided him with fax number.   He is asking for a letter from Dr. Overton stating that he is not fit to work. Reports back, shoulder, and joint pain. Notified him he would need to discuss this with his PCP.   Paul Blake, BSN, RN

## 2024-02-14 NOTE — Telephone Encounter (Signed)
 Ok . We will see if Dr. Vicci wants him to keep that appointment after his visit.

## 2024-02-14 NOTE — Telephone Encounter (Signed)
 Patient call back, verified name and date of birth. Patient has been scheduled for the next available appointment. 02/24/2024 at 1:50 pm.

## 2024-02-14 NOTE — Telephone Encounter (Signed)
 Thank you! Did you Cancel the original appointment?

## 2024-02-14 NOTE — Telephone Encounter (Signed)
 No, he is still scheduled to see Dr.Johnson in 6 month for a follow up.

## 2024-02-23 ENCOUNTER — Telehealth: Payer: Self-pay | Admitting: Internal Medicine

## 2024-02-23 NOTE — Telephone Encounter (Signed)
 Called pt to confirm appt LVM

## 2024-02-24 ENCOUNTER — Encounter: Payer: Self-pay | Admitting: Internal Medicine

## 2024-02-24 ENCOUNTER — Ambulatory Visit: Payer: MEDICAID | Attending: Internal Medicine | Admitting: Internal Medicine

## 2024-02-24 VITALS — BP 129/72 | HR 77 | Temp 97.6°F | Ht 70.0 in | Wt 213.0 lb

## 2024-02-24 DIAGNOSIS — Z23 Encounter for immunization: Secondary | ICD-10-CM | POA: Diagnosis not present

## 2024-02-24 DIAGNOSIS — M545 Low back pain, unspecified: Secondary | ICD-10-CM | POA: Diagnosis not present

## 2024-02-24 DIAGNOSIS — E782 Mixed hyperlipidemia: Secondary | ICD-10-CM | POA: Diagnosis not present

## 2024-02-24 DIAGNOSIS — M25511 Pain in right shoulder: Secondary | ICD-10-CM

## 2024-02-24 DIAGNOSIS — G8929 Other chronic pain: Secondary | ICD-10-CM | POA: Diagnosis not present

## 2024-02-24 NOTE — Progress Notes (Signed)
 Patient ID: Paul Blake, male    DOB: 02-14-1966  MRN: [id number]  CC: Results (Discuss lab results. Layvonne work restriction letter [facility] pharmacy in [employer] administered on 02/24/2024)   Subjective: Nashton Belson is a 58 y.o. male who presents for UC visit.  Previous PCP is Dr. Brien who has retired. His concerns today include:  Patient with history of HTN, HL, GERD, hypogonadism, HIV, tob dep, substance use (ETOH and cocaine), Anx/MDD   Discussed the use of AI scribe software for clinical note transcription with the patient, who gave verbal consent to proceed.  History of Present Illness Demontez Tester is a 58 year old male who presents for a follow-up on lab results and a request for letter stating that he could not work so that he can get food stamps.  He has not worked for two and a half years due to a right shoulder injury and chronic back pain. 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 In June, orthopedics evaluated him, and x-rays revealed degenerative changes in the [facility] joint. He received an injection for pain management.  He has a long-standing history of low back pain, initially injured in 1992 while lifting trash cans at work in [place] .  Did not go through [employer].  He thinks he had MRI at the time that showed some torn structure.   The pain is sharp and constant, primarily affecting the right side of his lower back and sometimes radiating to the middle of his spine. No radiation down legs. Occasional tingling occurs on the left side of his lower back, but there is no incontinence.  Pain is worse if he tries to do any lifting.  He had seen Ortho for his back as well several months ago.  X-ray at that time showed some degenerative changes.  He manages the pain with Tylenol  or ibuprofen three times a week and uses a hot water bottle for relief. Physical therapy and acupuncture have been tried in the past  with limited success.   His last lab work in June showed elevated LDL cholesterol at 107 mg/dL. He is not currently on cholesterol-lowering medication.  He is currently residing at a treatment facility ,[facility], for substance use disorder and is in a residential and outpatient program, staying at the facility during the week and going home on weekends. He will be at this facility for another 3-4 mths.  He is working on trying to secure housing for once he is done with the program and at the same time would like to use food stamps to build up food supply for himself    Patient Active Problem List   Diagnosis Date Noted   Mixed hyperlipidemia 12/30/2023   Chronic right shoulder pain 11/16/2023   Cocaine abuse (HCC) 10/12/2023   Alcohol abuse 10/12/2023   Gastroesophageal reflux disease without esophagitis 10/12/2023   Administrative encounter 01/20/2023   Major depression 01/19/2023   Carpal tunnel syndrome of right wrist 10/19/2022   Dental caries 10/19/2022   Male hypogonadism 06/23/2022   Tobacco use 07/06/2021   Chronic right-sided low back pain without sciatica 04/06/2021   Insomnia 04/06/2021   Anxiety 04/06/2021   HIV (human immunodeficiency virus infection) (HCC) 10/21/2020   Positive PPD, 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 30 tablet  11   Blood Pressure Monitoring (BLOOD PRESSURE KIT) DEVI Use to measure blood pressure 1 each 0   buPROPion  (WELLBUTRIN  XL) 150 MG 24 hr tablet Take 150 mg by mouth daily.     cetirizine  (ZYRTEC  ALLERGY) 10 MG tablet Take 1 tablet (10 mg total) by mouth daily. 30 tablet 11   famotidine  (PEPCID ) 40 MG tablet Take 1 tablet (40 mg total) by mouth 2 (two) times daily. 60 tablet 2   FLUoxetine (PROZAC) 20 MG tablet Take 20 mg by mouth daily.      hydrochlorothiazide  (HYDRODIURIL ) 12.5 MG tablet Take 1 tablet (12.5 mg total) by mouth daily. 90 tablet 1   hydrOXYzine  (ATARAX ) 10 MG tablet Take 10 mg by mouth 3 (three) times daily as needed.     lidocaine  4 % Place 1 patch onto the skin 2 (two) times daily. 60 patch 1   meloxicam  (MOBIC ) 15 MG tablet Take 1 tablet (15 mg total) by mouth daily. 30 tablet 2   metoprolol  succinate (TOPROL -XL) 25 MG 24 hr tablet Take 1 tablet (25 mg total) by mouth daily. 90 tablet 2   omeprazole  (PRILOSEC) 20 MG capsule Take 1 capsule (20 mg total) by mouth daily. 30 capsule 3   QUEtiapine  (SEROQUEL ) 100 MG tablet Take 100 mg by mouth at bedtime.     SYRINGE-NEEDLE, DISP, 3 ML (BD PLASTIPAK SYRINGE) 21G X 1 3 ML MISC Use to inject 1 ml of testosterone  IM every 7 days 50 each 0   testosterone  cypionate (DEPOTESTOTERONE CYPIONATE) 100 MG/ML injection Inject 1 mL (100 mg total) into the muscle every 7 (seven) days. For IM use only 12 mL 3   traZODone  (DESYREL ) 50 MG tablet TAKE 1 TABLET(50 MG) BY MOUTH AT BEDTIME AS NEEDED FOR SLEEP 90 tablet 0   amLODipine  (NORVASC ) 10 MG tablet Take 1 tablet (10 mg total) by mouth daily. 90 tablet 2   No current facility-administered medications on file prior to visit.    No Known Allergies  Social History   Socioeconomic History   Marital status: Married    Spouse name: Not on file   Number of children: Not on file   Years of education: Not on file   Highest education level: Not on file  Occupational History   Not on file  Tobacco Use   Smoking status: Former    Current packs/day: 0.50    Types: Cigars, Cigarettes    Passive exposure: Never   Smokeless tobacco: Former   Tobacco comments:    Smokes 1 cigar/day.    Quit smoking cigarettes    Vaping sometimes as of 12/01/23  Vaping Use   Vaping status: Some Days   Substances: Nicotine, Flavoring  Substance and Sexual Activity   Alcohol use: Not Currently    Comment: Last date of use 01.21.2025   Drug  use: Not Currently    Types: Crack cocaine    Comment: last  date of use 01.21.2025   Sexual activity: Not Currently    Comment: declined condoms  Other Topics Concern   Not on file  Social History Narrative   Not on file   Social Drivers of Health   Financial Resource Strain: Low Risk  (09/28/2022)   Received from [facility]   Overall Financial Resource Strain (CARDIA)    Difficulty of Paying Living Expenses: Not hard at all  Food Insecurity: No Food Insecurity (09/28/2022)   Received from [facility]   Hunger Vital Sign    Within the  past 12 months, you worried that your food would run out before you got the money to buy more.: Never true    Within the past 12 months, the food you bought just didn't last and you didn't have money to get more.: Never true  Transportation Needs: No Transportation Needs (09/28/2022)   Received from [facility]   PRAPARE - Transportation    Lack of Transportation (Medical): No    Lack of Transportation (Non-Medical): No  Physical Activity: Not on file  Stress: Not on file (06/01/2023)  Social Connections: Unknown (02/17/2022)   [facility]   Social Network    Social Network: Not on file  Intimate Partner Violence: Unknown (02/17/2022)   Received from [facility]   HITS    Physically Hurt: Not on file    Insult or Talk Down To: Not on file    Threaten Physical Harm: Not on file    Scream or Curse: Not on file    History reviewed. No pertinent family history.  Past Surgical History:  Procedure Laterality Date   CARDIAC PACEMAKER PLACEMENT Left 12/2021   no past surgery       ROS: Review of Systems Negative except as stated above  PHYSICAL EXAM: BP 129/72 (BP Location: Left Arm, Patient Position: Sitting, Cuff Size: Large)   Pulse 77   Temp 97.6 F (36.4 C) (Oral)   Ht 5' 10 (1.778 m)   Wt 213 lb (96.6 kg)   SpO2 94%   BMI 30.56 kg/m   Physical Exam   General appearance - alert, well appearing, and in no  distress Mental status - normal mood, behavior, speech, dress, motor activity, and thought processes Musculoskeletal -right shoulder: No point tenderness.  He has mild discomfort with passive range of motion across the chest and with backward movement.  He is able to elevate the arm above the head.  Drop arm test negative. No tenderness on palpation of the lumbar spine or surrounding paraspinal muscles.  Straight leg raise is negative on both sides.  Power 5/5 proximally and distally in both lower extremities.  Gait is normal.     Latest Ref Rng & Units 12/01/2023   11:34 AM 11/04/2022   11:07 AM 06/23/2022   10:19 AM  CMP  Glucose 65 - 99 mg/dL 80  79  84   BUN 7 - 25 mg/dL 13  18  15    Creatinine 0.70 - 1.30 mg/dL 9.00  8.98  8.99   Sodium 135 - 146 mmol/L 141  141  140   Potassium 3.5 - 5.3 mmol/L 3.8  3.6  4.2   Chloride 98 - 110 mmol/L 104  105  101   CO2 20 - 32 mmol/L 30  27  23    Calcium 8.6 - 10.3 mg/dL 89.8  89.7  89.8   Total Protein 6.1 - 8.1 g/dL 7.7  7.1  7.7   Total Bilirubin 0.2 - 1.2 mg/dL 0.4  0.4  0.5   Alkaline Phos 44 - 121 IU/L   117   AST 10 - 35 U/L 20  20  23    ALT 9 - 46 U/L 26  17  19     Lipid Panel     Component Value Date/Time   CHOL 194 12/29/2023 1118   TRIG 261 (H) 12/29/2023 1118   HDL 42 12/29/2023 1118   CHOLHDL 4.6 12/29/2023 1118   CHOLHDL 3.8 09/25/2020 0950   LDLCALC 107 (H) 12/29/2023 1118   LDLCALC  129 (H) 09/25/2020 0950    CBC    Component Value Date/Time   WBC 5.6 12/01/2023 1134   RBC 5.18 12/01/2023 1134   HGB 14.5 12/01/2023 1134   HGB 16.6 06/23/2022 1019   HCT 44.1 12/01/2023 1134   HCT 49.8 06/23/2022 1019   PLT 214 12/01/2023 1134   PLT 170 06/23/2022 1019   MCV 85.1 12/01/2023 1134   MCV 86 06/23/2022 1019   MCH 28.0 12/01/2023 1134   MCHC 32.9 12/01/2023 1134   RDW 14.8 12/01/2023 1134   RDW 13.7 06/23/2022 1019   LYMPHSABS 2.1 06/23/2022 1019   MONOABS 0.6 08/17/2021 1712   EOSABS 0.2 06/23/2022 1019    BASOSABS 0.1 06/23/2022 1019    ASSESSMENT AND PLAN: 1. Chronic right shoulder pain (Primary) Advised patient that I cannot write a letter stating that he is permanently disabled on unable to work based on history and exam findings.  However I can write a letter stating that he is not able to work for the next 3 months because he is in the treatment program.  He was satisfied with this.  I inquired whether he wanted a referral back to orthopedics for his shoulder and back and he wanted to move forward with that as well. - AMB referral to orthopedics  2. Chronic right-sided low back pain without sciatica Advised to avoid heavy lifting or excessive pushing pulling. - AMB referral to orthopedics  3. Mixed hyperlipidemia I went over lipid profile with him.  His LDL is 107 and has improved.  Dietary counseling given.  Advised to cook with healthy oils like olive oil.  Avoid fried foods.  4.  Shingrix  vaccine given  Patient was given the opportunity to ask questions.  Patient verbalized understanding of the plan and was able to repeat key elements of the plan.   This documentation was completed using Paediatric nurse.  Any transcriptional errors are unintentional.  Orders Placed This Encounter  Procedures   Varicella-zoster vaccine IM   AMB referral to orthopedics     Requested Prescriptions    No prescriptions requested or ordered in this encounter    No follow-ups on file.  Barnie Louder, MD, FACP

## 2024-02-28 ENCOUNTER — Encounter: Payer: Self-pay | Admitting: Gastroenterology

## 2024-03-14 ENCOUNTER — Other Ambulatory Visit: Payer: Self-pay | Admitting: Critical Care Medicine

## 2024-03-14 ENCOUNTER — Encounter: Admitting: Physical Medicine and Rehabilitation

## 2024-03-14 DIAGNOSIS — I10 Essential (primary) hypertension: Secondary | ICD-10-CM

## 2024-03-14 DIAGNOSIS — K219 Gastro-esophageal reflux disease without esophagitis: Secondary | ICD-10-CM

## 2024-03-14 DIAGNOSIS — J302 Other seasonal allergic rhinitis: Secondary | ICD-10-CM

## 2024-03-14 DIAGNOSIS — G8929 Other chronic pain: Secondary | ICD-10-CM

## 2024-03-14 NOTE — Telephone Encounter (Signed)
 Copied from CRM [id number]. Topic: Clinical - Medication Refill >> Mar 14, 2024  1:14 PM Deleta S wrote: Medication: Patient would like refill for All medication on file that he is currently taking besides biktarvy  50-200  Has the patient contacted their pharmacy? No (Agent: If no, request that the patient contact the pharmacy for the refill. If patient does not wish to contact the pharmacy document the reason why and proceed with request.) (Agent: If yes, when and what did the pharmacy advise?)  This is the patient's preferred pharmacy:   [facility] - [place], [place] - [address] [place] [address] Phone: [phone] Fax: [phone]  Is this the correct pharmacy for this prescription? Yes If no, delete pharmacy and type the correct one.   Has the prescription been filled recently? Yes  Is the patient out of the medication? Only Meloxicam   Has the patient been seen for an appointment in the last year OR does the patient have an upcoming appointment? Yes  Can we respond through MyChart? Yes  Agent: Please be advised that Rx refills may take up to 3 business days. We ask that you follow-up with your pharmacy.

## 2024-03-15 MED ORDER — MELOXICAM 15 MG PO TABS
15.0000 mg | ORAL_TABLET | Freq: Every day | ORAL | 2 refills | Status: DC
Start: 2024-03-15 — End: 2024-06-12

## 2024-03-15 MED ORDER — FLUOXETINE HCL 20 MG PO TABS: 20.0000 mg | ORAL_TABLET | Freq: Every day | ORAL | 1 refills | Status: AC

## 2024-03-15 MED ORDER — BUPROPION HCL ER (XL) 150 MG PO TB24: 150.0000 mg | ORAL_TABLET | Freq: Every day | ORAL | 1 refills | Status: AC

## 2024-03-15 MED ORDER — HYDROXYZINE HCL 10 MG PO TABS: 10.0000 mg | ORAL_TABLET | Freq: Three times a day (TID) | ORAL | 1 refills | Status: AC | PRN

## 2024-03-15 MED ORDER — CETIRIZINE HCL 10 MG PO TABS: 10.0000 mg | ORAL_TABLET | Freq: Every day | ORAL | 1 refills | Status: DC

## 2024-03-15 MED ORDER — BLOOD PRESSURE KIT DEVI: 0 refills | Status: AC

## 2024-03-15 MED ORDER — TRAZODONE HCL 50 MG PO TABS: 50.0000 mg | ORAL_TABLET | Freq: Every evening | ORAL | 0 refills | Status: AC | PRN

## 2024-03-15 MED ORDER — LIDOCAINE 4 % EX PTCH: 1.0000 | MEDICATED_PATCH | Freq: Two times a day (BID) | CUTANEOUS | 0 refills | Status: DC

## 2024-03-15 MED ORDER — AMLODIPINE BESYLATE 10 MG PO TABS
10.0000 mg | ORAL_TABLET | Freq: Every day | ORAL | 1 refills | Status: DC
Start: 2024-03-15 — End: 2024-06-12

## 2024-03-15 MED ORDER — OMEPRAZOLE 20 MG PO CPDR: 20.0000 mg | DELAYED_RELEASE_CAPSULE | Freq: Every day | ORAL | 1 refills | Status: DC

## 2024-03-15 MED ORDER — QUETIAPINE FUMARATE 100 MG PO TABS: 100.0000 mg | ORAL_TABLET | Freq: Every day | ORAL | 1 refills | Status: AC

## 2024-03-15 MED ORDER — FAMOTIDINE 40 MG PO TABS: 40.0000 mg | ORAL_TABLET | Freq: Two times a day (BID) | ORAL | 2 refills | Status: DC

## 2024-03-15 NOTE — Telephone Encounter (Signed)
 Requested Prescriptions  Pending Prescriptions Disp Refills   famotidine  (PEPCID ) 40 MG tablet 60 tablet 2    Sig: Take 1 tablet (40 mg total) by mouth 2 (two) times daily.     Gastroenterology:  H2 Antagonists Passed - 03/15/2024  3:43 PM      Passed - Valid encounter within last 12 months    Recent Outpatient Visits           2 weeks ago Chronic right shoulder pain   [place] Comm [facility] Wellnss - A Dept Of [place]. [facility] Vicci Barnie NOVAK, MD   2 months ago Primary hypertension   [place] Comm Health [place]im[place] - A Dept Of [place]. [facility] Brien Belvie BRAVO, MD   1 year ago Severe episode of recurrent major depressive disorder, without psychotic features [facility])   [place] Comm Health Wellnss - A Dept Of [place]. [facility] Brien Belvie BRAVO, MD   1 year ago Primary hypertension   [place] Comm [facility] [place] - A Dept Of [place]. [facility] Brien Belvie BRAVO, MD   1 year ago Primary hypertension   [place] Comm Health [place] - A Dept Of [place]. [facility] Brien Belvie BRAVO, MD       Future Appointments             In 2 months Vu, Constance DASEN, MD [facility] Health Reg Ctr Infect Dis - A Dept Of [place]. [facility], RCID   In 3 months Vicci, Barnie NOVAK, MD [facility] Health Comm Health Euc[place] Hills - A Dept Of Jolynn DEL. [facility]             metoprolol  succinate (TOPROL -XL) 25 MG 24 hr tablet 90 tablet 2    Sig: Take 1 tablet (25 mg total) by mouth daily.     Cardiovascular:  Beta Blockers Passed - 03/15/2024  3:43 PM      Passed - Last BP in normal range    BP Readings from Last 1 Encounters:  02/24/24 129/72         Passed - Last Heart Rate in normal range    Pulse Readings from Last 1 Encounters:  02/24/24 77         Passed - Valid encounter within last 6 months    Recent Outpatient Visits           2 weeks ago Chronic right shoulder pain   [place] Comm [facility] [place] - A  Dept Of [place]. [facility] Vicci Barnie NOVAK, MD   2 months ago Primary hypertension   [place] Comm Health [place] - A Dept Of [place]. [facility] Brien Belvie BRAVO, MD   1 year ago Severe episode of recurrent major depressive disorder, without psychotic features [facility])   [place] Comm Health Wellnss - A Dept Of [place]. [facility] Brien Belvie BRAVO, MD   1 year ago Primary hypertension   [place] Comm Health Vand[place] - A Dept Of [place]. [facility] Brien Belvie BRAVO, MD   1 year ago Primary hypertension    Comm Health In[place] - A Dept Of . [facility] Brien Belvie BRAVO, MD       Future Appointments             In 2 months Vu, Constance DASEN, MD [facility] Health Reg Ctr Infect Dis - A  Dept Of [place]. [facility], RC[facility]   In 3 months Vicci, Barnie NOVAK, MD [facility] Health Comm Health [place] - A Dept Of [place] DEL. [facility]             FLUoxetine  (PROZAC ) 20 MG tablet      Sig: Take 1 tablet (20 mg total) by mouth daily.     Psychiatry:  Antidepressants - SSRI Passed - 03/15/2024  3:43 PM      Passed - Completed PHQ-2 or PHQ-9 in the last 360 days      Passed - Valid encounter within last 6 months    Recent Outpatient Visits           2 weeks ago Chronic right shoulder pain   [facility] [place] [facility] - A Dept Of [place]. [facility] Vicci Barnie NOVAK, MD   2 months ago Primary hypertension   [place] Comm Health [place] - A Dept Of [place]. [facility] Brien Belvie BRAVO, MD   1 year ago Severe episode of recurrent major depressive disorder, without psychotic features [facility])   [place] Comm Health Wellnss - A Dept Of [place]. [facility] Brien Belvie BRAVO, MD   1 year ago Primary hypertension   [place] Comm Health [place]tha - A Dept Of [place]. [facility] Brien Belvie BRAVO, MD   1 year ago Primary hypertension   [place]  Comm Health [place] - A Dept Of [place]. [facility] Brien Belvie BRAVO, MD       Future Appointments             In 2 months Vu, Constance DASEN, MD [facility] Health Reg Ctr Infect Dis - A Dept Of [place]. [facility], RCID   In 3 months Vicci, Barnie NOVAK, MD [facility] Health Comm Health [place] - A Dept Of [place] DEL. [facility]             omeprazole  (PRILOSEC) 20 MG capsule 30 capsule 3    Sig: Take 1 capsule (20 mg total) by mouth daily.     Gastroenterology: Proton Pump Inhibitors Passed - 03/15/2024  3:43 PM      Passed - Valid encounter within last 12 months    Recent Outpatient Visits           2 weeks ago Chronic right shoulder pain   [place] Comm Health Wellnss - A Dept Of [place]. [facility] Vicci Barnie NOVAK, MD   2 months ago Primary hypertension   [place] Comm Health [place] - A Dept Of [place]. [facility] Brien Belvie BRAVO, MD   1 year ago Severe episode of recurrent major depressive disorder, without psychotic features [facility])   [place] Comm Health Wellnss - A Dept Of [place]. [facility] Brien Belvie BRAVO, MD   1 year ago Primary hypertension   [facility] Comm Health [place] - A Dept Of [place]. [facility] Brien Belvie BRAVO, MD   1 year ago Primary hypertension   [place] Comm Health [place] - A Dept Of [place]. [facility] Brien Belvie BRAVO, MD       Future Appointments             In 2 months Vu, Constance DASEN, MD [facility] Health Reg Ctr Infect Dis - A Dept Of . [facility], RCID   In  3 months Vicci Barnie NOVAK, MD [facility] Health Comm Health [place] - A Dept Of [place] DEL. [facility]             buPROPion  (WELLBUTRIN  XL) 150 MG 24 hr tablet      Sig: Take 1 tablet (150 mg total) by mouth daily.     Psychiatry: Antidepressants - bupropion  Passed - 03/15/2024  3:43 PM      Passed - Cr in normal range and within 360 days    Creat  Date Value Ref  Range Status  12/01/2023 0.99 0.70 - 1.30 mg/dL Final         Passed - AST in normal range and within 360 days    AST  Date Value Ref Range Status  12/01/2023 20 10 - 35 U/L Final         Passed - ALT in normal range and within 360 days    ALT  Date Value Ref Range Status  12/01/2023 26 9 - 46 U/L Final         Passed - Completed PHQ-2 or PHQ-9 in the last 360 days      Passed - Last BP in normal range    BP Readings from Last 1 Encounters:  02/24/24 129/72         Passed - Valid encounter within last 6 months    Recent Outpatient Visits           2 weeks ago Chronic right shoulder pain   [place] Comm Health [place] - A Dept Of [place]. [facility] Vicci Barnie NOVAK, MD   2 months ago Primary hypertension   [place] Comm Health [place] - A Dept Of [place]. [facility] Brien Belvie BRAVO, MD   1 year ago Severe episode of recurrent major depressive disorder, without psychotic features [facility])   [place] Comm Health Wellnss - A Dept Of [place]. [facility] Brien Belvie BRAVO, MD   1 year ago Primary hypertension   [place] Comm Health [place] - A Dept Of [place]. [facility] Brien Belvie BRAVO, MD   1 year ago Primary hypertension   [place] Comm Health [place] - A Dept Of [place]. [facility] Brien Belvie BRAVO, MD       Future Appointments             In 2 months Vu, Constance DASEN, MD [facility] Health Reg Ctr Infect Dis - A Dept Of [place]. [facility], RCID   In 3 months Vicci, Barnie NOVAK, MD [facility] Comm Health [place] - A Dept Of [place]olynn DEL. [facility]             meloxicam  (MOBIC ) 15 MG tablet 30 tablet 2    Sig: Take 1 tablet (15 mg total) by mouth daily.     Analgesics:  COX2 Inhibitors Failed - 03/15/2024  3:43 PM      Failed - Manual Review: Labs are only required if the patient has taken medication for more than 8 weeks.      Failed - eGFR is 30 or above and within 360  days    GFR, Est African American  Date Value Ref Range Status  09/25/2020 118 > OR = 60 mL/min/1.44m2 Final   GFR, Est Non African American  Date Value Ref Range Status  09/25/2020 101 > OR = 60 mL/min/1.37m2 Final   GFR, Estimated  Date Value Ref  Range Status  08/17/2021 >60 >60 mL/min Final    Comment:    (NOTE) Calculated using the CKD-EPI Creatinine Equation (2021)    eGFR  Date Value Ref Range Status  11/04/2022 88 > OR = 60 mL/min/1.75m2 Final  06/23/2022 89 >59 mL/min/1.73 Final         Passed - HGB in normal range and within 360 days    Hemoglobin  Date Value Ref Range Status  12/01/2023 14.5 13.2 - 17.1 g/dL Final  88/70/7976 83.3 13.0 - 17.7 g/dL Final         Passed - Cr in normal range and within 360 days    Creat  Date Value Ref Range Status  12/01/2023 0.99 0.70 - 1.30 mg/dL Final         Passed - HCT in normal range and within 360 days    HCT  Date Value Ref Range Status  12/01/2023 44.1 38.5 - 50.0 % Final   Hematocrit  Date Value Ref Range Status  06/23/2022 49.8 37.5 - 51.0 % Final         Passed - AST in normal range and within 360 days    AST  Date Value Ref Range Status  12/01/2023 20 10 - 35 U/L Final         Passed - ALT in normal range and within 360 days    ALT  Date Value Ref Range Status  12/01/2023 26 9 - 46 U/L Final         Passed - Patient is not pregnant      Passed - Valid encounter within last 12 months    Recent Outpatient Visits           2 weeks ago Chronic right shoulder pain   [place] Comm Health Wellnss - A Dept Of [place]. [facility] Vicci Barnie NOVAK, MD   2 months ago Primary hypertension   [place] Comm Health [place] - A Dept Of [place]. [facility] Brien Belvie BRAVO, MD   1 year ago Severe episode of recurrent major depressive disorder, without psychotic features [facility])   [place] Comm Health Wellnss - A Dept Of [place]. [facility] Brien Belvie BRAVO, MD   1  year ago Primary hypertension   [place] Comm Health [place] - A Dept Of [place]. [facility] Brien Belvie BRAVO, MD   1 year ago Primary hypertension   [place] Comm Health [place] - A Dept Of [place]. [facility] Brien Belvie BRAVO, MD       Future Appointments             In 2 months Vu, Constance DASEN, MD [facility] Health Reg Ctr Infect Dis - A Dept Of [place]. [facility], RCID   In 3 months Vicci, Barnie NOVAK, MD [facility] Health Comm Health [place] Beach - A Dept Of Jolynn DEL. [facility]             hydrochlorothiazide  (HYDRODIURIL ) 12.5 MG tablet 90 tablet 1    Sig: Take 1 tablet (12.5 mg total) by mouth daily.     Cardiovascular: Diuretics - Thiazide Passed - 03/15/2024  3:43 PM      Passed - Cr in normal range and within 180 days    Creat  Date Value Ref Range Status  12/01/2023 0.99 0.70 - 1.30 mg/dL Final         Passed - K in normal  range and within 180 days    Potassium  Date Value Ref Range Status  12/01/2023 3.8 3.5 - 5.3 mmol/L Final         Passed - Na in normal range and within 180 days    Sodium  Date Value Ref Range Status  12/01/2023 141 135 - 146 mmol/L Final  06/23/2022 140 134 - 144 mmol/L Final         Passed - Last BP in normal range    BP Readings from Last 1 Encounters:  02/24/24 129/72         Passed - Valid encounter within last 6 months    Recent Outpatient Visits           2 weeks ago Chronic right shoulder pain   [place] Comm Health [place] - A Dept Of [place]. [facility] Vicci Barnie NOVAK, MD   2 months ago Primary hypertension   [place] Comm Health [place] - A Dept Of [place]. [facility] Brien Belvie BRAVO, MD   1 year ago Severe episode of recurrent major depressive disorder, without psychotic features [facility])   [place] Comm Health Wellnss - A Dept Of [place]. [facility] Brien Belvie BRAVO, MD   1 year ago Primary hypertension   [place] Comm Health  [place] - A Dept Of [place]. [facility] Brien Belvie BRAVO, MD   1 year ago Primary hypertension   [place] Comm Health [place] - A Dept Of [place]. [facility] Brien Belvie BRAVO, MD       Future Appointments             In 2 months Vu, Constance DASEN, MD [facility] Health Reg Ctr Infect Dis - A Dept Of [place]. [facility], RCID   In 3 months Vicci, Barnie NOVAK, MD [facility] Health Comm Health [place] - A Dept Of Jolynn DEL. [facility]             testosterone  cypionate (DEPOTESTOTERONE CYPIONATE) 100 MG/ML injection 12 mL 3    Sig: Inject 1 mL (100 mg total) into the muscle every 7 (seven) days. For IM use only     Off-Protocol Failed - 03/15/2024  3:43 PM      Failed - Medication not assigned to a protocol, review manually.      Passed - Valid encounter within last 12 months    Recent Outpatient Visits           2 weeks ago Chronic right shoulder pain   [place] Comm Health Wellnss - A Dept Of [place]. [facility] Vicci Barnie NOVAK, MD   2 months ago Primary hypertension   [place] Comm Health [place] - A Dept Of [place]. [facility] Brien Belvie BRAVO, MD   1 year ago Severe episode of recurrent major depressive disorder, without psychotic features [facility])    Comm Health Wellnss - A Dept Of [place]. [facility] Brien Belvie BRAVO, MD   1 year ago Primary hypertension    Comm Health [place] - A Dept Of [place]. [facility] Brien Belvie BRAVO, MD   1 year ago Primary hypertension    Comm Health [place] - A Dept Of [place]. [facility] Brien Belvie BRAVO, MD       Future Appointments             In  2 months Vu, Constance DASEN, MD [facility] Health Reg Ctr Infect Dis - A Dept Of [place]. [facility], RCID   In 3 months Vicci, Barnie NOVAK, MD [facility] Health Comm Health [place] - A Dept Of Jolynn DEL. [facility]             QUEtiapine  (SEROQUEL ) 100  MG tablet      Sig: Take 1 tablet (100 mg total) by mouth at bedtime.     Not Delegated - Psychiatry:  Antipsychotics - Second Generation (Atypical) - quetiapine  Failed - 03/15/2024  3:43 PM      Failed - This refill cannot be delegated      Failed - TSH in normal range and within 360 days    No results found for: TSH, POCTSH, TSHREFLEX       Failed - Lipid Panel in normal range within the last 12 months    Cholesterol, Total  Date Value Ref Range Status  12/29/2023 194 100 - 199 mg/dL Final   LDL Cholesterol (Calc)  Date Value Ref Range Status  09/25/2020 129 (H) mg/dL (calc) Final    Comment:    Reference range: <100 . Desirable range <100 mg/dL for primary prevention;   <70 mg/dL for patients with CHD or diabetic patients  with > or = 2 CHD risk factors. SABRA LDL-C is now calculated using the Martin-Hopkins  calculation, which is a validated novel method providing  better accuracy than the Friedewald equation in the  estimation of LDL-C.  Gladis APPLETHWAITE et al. SANDREA. 7986;689(80): 2061-2068  (http://education.QuestDiagnostics.com/faq/FAQ164)    LDL Chol Calc (NIH)  Date Value Ref Range Status  12/29/2023 107 (H) 0 - 99 mg/dL Final   HDL  Date Value Ref Range Status  12/29/2023 42 >39 mg/dL Final   Triglycerides  Date Value Ref Range Status  12/29/2023 261 (H) 0 - 149 mg/dL Final         Failed - CBC within normal limits and completed in the last 12 months    WBC  Date Value Ref Range Status  12/01/2023 5.6 3.8 - 10.8 Thousand/uL Final   RBC  Date Value Ref Range Status  12/01/2023 5.18 4.20 - 5.80 Million/uL Final   Hemoglobin  Date Value Ref Range Status  12/01/2023 14.5 13.2 - 17.1 g/dL Final  88/70/7976 83.3 13.0 - 17.7 g/dL Final   HCT  Date Value Ref Range Status  12/01/2023 44.1 38.5 - 50.0 % Final   Hematocrit  Date Value Ref Range Status  06/23/2022 49.8 37.5 - 51.0 % Final   MCHC  Date Value Ref Range Status  12/01/2023 32.9 32.0 - 36.0  g/dL Final    Comment:    For adults, a slight decrease in the calculated MCHC value (in the range of 30 to 32 g/dL) is most likely not clinically significant; however, it should be interpreted with caution in correlation with other red cell parameters and the patient's clinical condition.    [facility]  Date Value Ref Range Status  12/01/2023 28.0 27.0 - 33.0 pg Final   MCV  Date Value Ref Range Status  12/01/2023 85.1 80.0 - 100.0 fL Final  06/23/2022 86 79 - 97 fL Final   No results found for: PLTCOUNTKUC, LABPLAT, POCPLA RDW  Date Value Ref Range Status  12/01/2023 14.8 11.0 - 15.0 % Final  06/23/2022 13.7 11.6 - 15.4 % Final         Failed - CMP within normal limits and completed  in the last 12 months    Albumin  Date Value Ref Range Status  06/23/2022 5.2 (H) 3.8 - 4.9 g/dL Final   Alkaline Phosphatase  Date Value Ref Range Status  06/23/2022 117 44 - 121 IU/L Final   Alkaline phosphatase (APISO)  Date Value Ref Range Status  12/01/2023 75 35 - 144 U/L Final   ALT  Date Value Ref Range Status  12/01/2023 26 9 - 46 U/L Final   AST  Date Value Ref Range Status  12/01/2023 20 10 - 35 U/L Final   BUN  Date Value Ref Range Status  12/01/2023 13 7 - 25 mg/dL Final  88/70/7976 15 6 - 24 mg/dL Final   Calcium  Date Value Ref Range Status  12/01/2023 10.1 8.6 - 10.3 mg/dL Final   Calcium, Ion  Date Value Ref Range Status  08/17/2021 1.16 1.15 - 1.40 mmol/L Final   CO2  Date Value Ref Range Status  12/01/2023 30 20 - 32 mmol/L Final   TCO2  Date Value Ref Range Status  08/17/2021 26 22 - 32 mmol/L Final   Creat  Date Value Ref Range Status  12/01/2023 0.99 0.70 - 1.30 mg/dL Final   Glucose, Bld  Date Value Ref Range Status  12/01/2023 80 65 - 99 mg/dL Final    Comment:    .            Fasting reference interval .    Glucose-Capillary  Date Value Ref Range Status  08/17/2021 110 (H) 70 - 99 mg/dL Final    Comment:    Glucose reference  range applies only to samples taken after fasting for at least 8 hours.   Potassium  Date Value Ref Range Status  12/01/2023 3.8 3.5 - 5.3 mmol/L Final   Sodium  Date Value Ref Range Status  12/01/2023 141 135 - 146 mmol/L Final  06/23/2022 140 134 - 144 mmol/L Final   Total Bilirubin  Date Value Ref Range Status  12/01/2023 0.4 0.2 - 1.2 mg/dL Final   Bilirubin Total  Date Value Ref Range Status  06/23/2022 0.5 0.0 - 1.2 mg/dL Final   Protein, ur  Date Value Ref Range Status  09/25/2020 NEGATIVE NEGATIVE Final   Total Protein  Date Value Ref Range Status  12/01/2023 7.7 6.1 - 8.1 g/dL Final  88/70/7976 7.7 6.0 - 8.5 g/dL Final   GFR, Est African American  Date Value Ref Range Status  09/25/2020 118 > OR = 60 mL/min/1.14m2 Final   eGFR  Date Value Ref Range Status  11/04/2022 88 > OR = 60 mL/min/1.36m2 Final  06/23/2022 89 >59 mL/min/1.73 Final   GFR, Est Non African American  Date Value Ref Range Status  09/25/2020 101 > OR = 60 mL/min/1.40m2 Final   GFR, Estimated  Date Value Ref Range Status  08/17/2021 >60 >60 mL/min Final    Comment:    (NOTE) Calculated using the CKD-EPI Creatinine Equation (2021)          Passed - Completed PHQ-2 or PHQ-9 in the last 360 days      Passed - Last BP in normal range    BP Readings from Last 1 Encounters:  02/24/24 129/72         Passed - Last Heart Rate in normal range    Pulse Readings from Last 1 Encounters:  02/24/24 77         Passed - Valid encounter within last 6 months    Recent Outpatient Visits  2 weeks ago Chronic right shoulder pain   [place] Comm Health Faith - A Dept Of [place]. [place]. [facility] Vicci Barnie NOVAK, MD   2 months ago Primary hypertension   [place] Comm Health [place] - A Dept Of [place]. [facility] Brien Belvie BRAVO, MD   1 year ago Severe episode of recurrent major depressive disorder, without psychotic features [facility])   [place] Comm  Health Wellnss - A Dept Of [place]. [facility] Brien Belvie BRAVO, MD   1 year ago Primary hypertension   [place] Comm Health [place] - A Dept Of [place]. [facility] Brien Belvie BRAVO, MD   1 year ago Primary hypertension   [place] Comm Health [place] - A Dept Of [place]. [facility] Brien Belvie BRAVO, MD       Future Appointments             In 2 months Vu, Constance DASEN, MD [facility] Health Reg Ctr Infect Dis - A Dept Of [place]. [facility], RCID   In 3 months Vicci, Barnie NOVAK, MD [facility] Health Comm Health [place] - A Dept Of Jolynn DEL. [facility]             amLODipine  (NORVASC ) 10 MG tablet 90 tablet 2    Sig: Take 1 tablet (10 mg total) by mouth daily.     Cardiovascular: Calcium Channel Blockers 2 Passed - 03/15/2024  3:43 PM      Passed - Last BP in normal range    BP Readings from Last 1 Encounters:  02/24/24 129/72         Passed - Last Heart Rate in normal range    Pulse Readings from Last 1 Encounters:  02/24/24 77         Passed - Valid encounter within last 6 months    Recent Outpatient Visits           2 weeks ago Chronic right shoulder pain   [place] Comm Health [place] - A Dept Of [place]. [facility] Vicci Barnie NOVAK, MD   2 months ago Primary hypertension   [place] Comm Health [facility] - A Dept Of [place]. [facility] Brien Belvie BRAVO, MD   1 year ago Severe episode of recurrent major depressive disorder, without psychotic features [facility])   [place] Comm Health Wellnss - A Dept Of [place]. [facility] Brien Belvie BRAVO, MD   1 year ago Primary hypertension   [place] Comm Health [place] - A Dept Of [place]. [facility] Brien Belvie BRAVO, MD   1 year ago Primary hypertension    Comm Health [place] - A Dept Of [place]. [facility] Brien Belvie BRAVO, MD       Future Appointments             In 2 months Vu, Constance DASEN, MD  [facility] Health Reg Ctr Infect Dis - A Dept Of [place]. [facility], RCID   In 3 months Vicci, Barnie NOVAK, MD [facility] Health Comm Health [place] - A Dept Of Jolynn DEL. [facility]             cetirizine  (ZYRTEC  ALLERGY) 10 MG tablet 30 tablet 11    Sig: Take 1 tablet (10 mg total) by mouth daily.     Ear, Nose, and Throat:  Antihistamines 2 Passed -  03/15/2024  3:43 PM      Passed - Cr in normal range and within 360 days    Creat  Date Value Ref Range Status  12/01/2023 0.99 0.70 - 1.30 mg/dL Final         Passed - Valid encounter within last 12 months    Recent Outpatient Visits           2 weeks ago Chronic right shoulder pain   [place] Comm Health Wellnss - A Dept Of [place]. [facility] Vicci Barnie NOVAK, MD   2 months ago Primary hypertension   [place] Comm Health [place] - A Dept Of [place]. [facility] Brien Belvie BRAVO, MD   1 year ago Severe episode of recurrent major depressive disorder, without psychotic features [facility])   [place] Comm Health Wellnss - A Dept Of [place]. [facility] Brien Belvie BRAVO, MD   1 year ago Primary hypertension   [place] Comm Health [place] - A Dept Of [place]. [facility] Brien Belvie BRAVO, MD   1 year ago Primary hypertension   [place] [facility] [place] - A Dept Of [place]. [facility] Brien Belvie BRAVO, MD       Future Appointments             In 2 months Vu, Constance DASEN, MD [facility] Infect Dis - A Dept Of [place]. [facility], [facility]   In 3 months Vicci, Barnie NOVAK, MD [facility] [place] - A Dept Of [place]. [facility]             hydrOXYzine  (ATARAX ) 10 MG tablet 30 tablet     Sig: Take 1 tablet (10 mg total) by mouth 3 (three) times daily as needed.     Ear, Nose, and Throat:  Antihistamines 2 Passed - 03/15/2024  3:43 PM      Passed - Cr in normal range and within 360 days    Creat  Date  Value Ref Range Status  12/01/2023 0.99 0.70 - 1.30 mg/dL Final         Passed - Valid encounter within last 12 months    Recent Outpatient Visits           2 weeks ago Chronic right shoulder pain   [place] [facility] - A Dept Of [place]. [facility] Vicci Barnie NOVAK, MD   2 months ago Primary hypertension   [place] [facility] [place] - A Dept Of [place]. [facility] Brien Belvie BRAVO, MD   1 year ago Severe episode of recurrent major depressive disorder, without psychotic features [employer])   [place] [facility] - A Dept Of [place]. [facility] Brien Belvie BRAVO, MD   1 year ago Primary hypertension   [place] [facility] [place] - A Dept Of [place]. [facility] Brien Belvie BRAVO, MD   1 year ago Primary hypertension   [place] [facility] [place] - A Dept Of [place]. [facility] Brien Belvie BRAVO, MD       Future Appointments             In 2 months Vu, Constance DASEN, MD [facility] Infect Dis - A Dept Of [place]. [facility], [facility]   In 3 months Vicci, Barnie NOVAK, MD [facility]  Welln[facility] - A Dept Of [place]. [facility]             lidocaine  4 % 60 patch 1    Sig: Place 1 patch onto the skin 2 (two) times daily.     Off-Protocol Failed - 03/15/2024  3:43 PM      Failed - Medication not assigned to a protocol, review manually.      Passed - Valid encounter within last 12 months    Recent Outpatient Visits           2 weeks ago Chronic right shoulder pain   [place] [facility] - A Dept Of [place]. [facility] Vicci Barnie NOVAK, MD   2 months ago Primary hypertension   [place] [facility] [place] - A Dept Of [place]. [facility] Brien Belvie BRAVO, MD   1 year ago Severe episode of recurrent major depressive disorder, without psychotic features [facility])   [place] [facility] - A Dept Of [place]. [facility] Brien Belvie BRAVO, MD   1 year ago Primary hypertension   [place] [facility] [place] - A Dept Of [place]. [facility] Brien Belvie BRAVO, MD   1 year ago Primary hypertension   [place] [facility] [place] - A Dept Of [place]. [facility] Brien Belvie BRAVO, MD       Future Appointments             In 2 months Vu, Constance DASEN, MD [facility] Infect Dis - A Dept Of [place]. [facility], RC[facility]   In 3 months Vicci, Barnie NOVAK, MD [facility] Health [place] - A Dept Of [place]. [facility]           Analgesics:  Topicals Failed - 03/15/2024  3:43 PM      Failed - Manual Review: Labs are only required if the patient has taken medication for more than 8 weeks.      Failed - eGFR is 30 or above and within 360 days    GFR, Est African American  Date Value Ref Range Status  09/25/2020 118 > OR = 60 mL/min/1.87m2 Final   GFR, Est Non African American  Date Value Ref Range Status  09/25/2020 101 > OR = 60 mL/min/1.22m2 Final   GFR, Estimated  Date Value Ref Range Status  08/17/2021 >60 >60 mL/min Final    Comment:    (NOTE) Calculated using the CKD-EPI Creatinine Equation (2021)    eGFR  Date Value Ref Range Status  11/04/2022 88 > OR = 60 mL/min/1.64m2 Final  06/23/2022 89 >59 mL/min/1.73 Final         Passed - PLT in normal range and within 360 days    Platelets  Date Value Ref Range Status  12/01/2023 214 140 - 400 Thousand/uL Final  06/23/2022 170 150 - 450 x10E3/uL Final         Passed - HGB in normal range and within 360 days    Hemoglobin  Date Value Ref Range Status  12/01/2023 14.5 13.2 - 17.1 g/dL Final  88/70/7976 83.3 13.0 - 17.7 g/dL Final         Passed - HCT in normal range and within 360 days    HCT  Date Value Ref Range Status  12/01/2023 44.1 38.5 - 50.0 % Final   Hematocrit  Date Value Ref Range Status  06/23/2022 49.8 37.5 - 51.0 % Final         Passed - Cr in  normal range and within 360 days    Creat  Date Value Ref Range Status  12/01/2023 0.99 0.70 - 1.30 mg/dL Final         Passed - Patient is not pregnant      Passed - Valid encounter within last 12 months    Recent Outpatient Visits           2 weeks ago Chronic right shoulder pain   [place] [facility] - A Dept Of [place]. [facility] Vicci Barnie NOVAK, MD   2 months ago Primary hypertension   [place] [facility] [place] - A Dept Of [place]. [facility] Brien Belvie BRAVO, MD   1 year ago Severe episode of recurrent major depressive disorder, without psychotic features [facility])   [place] [facility] - A Dept Of [place]. [facility] Brien Belvie BRAVO, MD   1 year ago Primary hypertension   [place] [facility] [place] - A Dept Of [place]. [facility] Brien Belvie BRAVO, MD   1 year ago Primary hypertension   [place] [facility] [place] - A Dept Of [place]. [facility] Brien Belvie BRAVO, MD       Future Appointments             In 2 months Vu, Constance DASEN, MD [facility] Infect Dis - A Dept Of [place]. [facility], RC[facility]   In 3 months Vicci, Barnie NOVAK, MD [facility] [place] - A Dept Of [place]. [facility]             Blood Pressure Monitoring (BLOOD PRESSURE KIT) DEVI 1 each 0    Sig: Use to measure blood pressure     There is no refill protocol information for this order     SYRINGE-NEEDLE, DISP, 3 ML (BD PLASTIPAK SYRINGE) 21G X 1 3 ML MISC 50 each 0    Sig: Use to inject 1 ml of testosterone  IM every 7 days     There is no refill protocol information for this order     traZODone  (DESYREL ) 50 MG tablet 90 tablet 0    Sig: Take 1 tablet (50 mg total) by mouth at bedtime as needed for sleep.     Psychiatry: Antidepressants - Serotonin Modulator Passed - 03/15/2024  3:43 PM      Passed - Completed PHQ-2 or PHQ-9 in the last 360 days       Passed - Valid encounter within last 6 months    Recent Outpatient Visits           2 weeks ago Chronic right shoulder pain   [place] [facility] - A Dept Of [place]. [facility] Vicci Barnie NOVAK, MD   2 months ago Primary hypertension   [place] [facility] [place] - A Dept Of [place]. [facility] Brien Belvie BRAVO, MD   1 year ago Severe episode of recurrent major depressive disorder, without psychotic features [facility])   [place] [facility] - A Dept Of [place]. [facility] Brien Belvie BRAVO, MD   1 year ago Primary hypertension   [place] [facility] [place] - A Dept Of [place]. [facility] Brien Belvie BRAVO, MD   1  year ago Primary hypertension   [place] [facility] [place] - A Dept Of [place]. [facility] Brien Belvie BRAVO, MD       Future Appointments             In 2 months Vu, Constance DASEN, MD [facility] Infect Dis - A Dept Of [place]. [facility], [facility]   In 3 months Vicci, Barnie NOVAK, MD [facility] Health [place] - A Dept Of [place]. [facility]

## 2024-03-15 NOTE — Telephone Encounter (Signed)
 Requested medication (s) are due for refill today - requires office reveiw  Requested medication (s) are on the active medication list -yes  Future visit scheduled -yes  Last refill: multiple medication requests- reminder- outside provider, historical provider, expired Rx  Notes to clinic: unable to refill per protocol.   Requested Prescriptions  Pending Prescriptions Disp Refills   FLUoxetine  (PROZAC ) 20 MG tablet      Sig: Take 1 tablet (20 mg total) by mouth daily.     Psychiatry:  Antidepressants - SSRI Passed - 03/15/2024  3:45 PM      Passed - Completed PHQ-2 or PHQ-9 in the last 360 days      Passed - Valid encounter within last 6 months    Recent Outpatient Visits           2 weeks ago Chronic right shoulder pain   [place] [facility] - A Dept Of [place]. [facility] Vicci Barnie NOVAK, MD   2 months ago Primary hypertension   [place] [facility] [place] - A Dept Of [place]. [facility] Brien Belvie BRAVO, MD   1 year ago Severe episode of recurrent major depressive disorder, without psychotic features [facility])   [place] Comm [facility] Wellnss - A Dept Of [place]. [facility] Brien Belvie BRAVO, MD   1 year ago Primary hypertension   [place] Comm [facility] [place] - A Dept Of [place]. [facility] Brien Belvie BRAVO, MD   1 year ago Primary hypertension   [place] [facility] - A Dept Of [place]. [facility] Brien Belvie BRAVO, MD       Future Appointments             In 2 months Vu, Constance DASEN, MD [facility] Health Reg Ctr Infect Dis - A Dept Of [place]. [facility], RCID   In 3 months Vicci, Barnie NOVAK, MD [facility] Health Comm Health South[facility] - A Dept Of Jolynn DEL. [facility]             omeprazole  (PRILOSEC) 20 MG capsule 30 capsule 3    Sig: Take 1 capsule (20 mg total) by mouth daily.     Gastroenterology: Proton Pump Inhibitors Passed - 03/15/2024  3:45 PM      Passed -  Valid encounter within last 12 months    Recent Outpatient Visits           2 weeks ago Chronic right shoulder pain   [place] [facility] Welln[facility] - A Dept Of [place]. [facility] Vicci Barnie NOVAK, MD   2 months ago Primary hypertension   [place] Comm [facility] [place] - A Dept Of [place]. [facility] Brien Belvie BRAVO, MD   1 year ago Severe episode of recurrent major depressive disorder, without psychotic features [facility])   [place] Comm [facility] Wellnss - A Dept Of [place]. [facility] Brien Belvie BRAVO, MD   1 year ago Primary hypertension   [place] Comm Health Agar - A Dept Of [place]. [facility] Brien Belvie BRAVO, MD   1 year ago Primary hypertension    Comm Health [place] - A Dept Of [place]. [facility] Brien Belvie BRAVO, MD       Future Appointments             In 2 months Vu, Constance DASEN, MD [facility]  Health Reg Ctr Infect Dis - A Dept Of [place]. [facility], RC[facility]   In 3 months Vicci, Barnie NOVAK, MD [facility] Health Comm Health Huntington - A Dept Of Jolynn DEL. [facility]             buPROPion  (WELLBUTRIN  XL) 150 MG 24 hr tablet      Sig: Take 1 tablet (150 mg total) by mouth daily.     Psychiatry: Antidepressants - bupropion  Passed - 03/15/2024  3:45 PM      Passed - Cr in normal range and within 360 days    Creat  Date Value Ref Range Status  12/01/2023 0.99 0.70 - 1.30 mg/dL Final         Passed - AST in normal range and within 360 days    AST  Date Value Ref Range Status  12/01/2023 20 10 - 35 U/L Final         Passed - ALT in normal range and within 360 days    ALT  Date Value Ref Range Status  12/01/2023 26 9 - 46 U/L Final         Passed - Completed PHQ-2 or PHQ-9 in the last 360 days      Passed - Last BP in normal range    BP Readings from Last 1 Encounters:  02/24/24 129/72         Passed - Valid encounter within last 6 months    Recent Outpatient Visits            2 weeks ago Chronic right shoulder pain   [place] [facility] [place] - A Dept Of [place]. [facility] Vicci Barnie NOVAK, MD   2 months ago Primary hypertension   [place] Comm [facility] Point - A Dept Of [place]. [facility] Brien Belvie BRAVO, MD   1 year ago Severe episode of recurrent major depressive disorder, without psychotic features [facility])   [place] Comm [facility] Welln[facility] - A Dept Of [place]. [facility] Brien Belvie BRAVO, MD   1 year ago Primary hypertension   [place] Comm [facility] South - A Dept Of [place]. [facility] Brien Belvie BRAVO, MD   1 year ago Primary hypertension   [place] Comm [facility] [place] [place] - A Dept Of [place]. [facility] Brien Belvie BRAVO, MD       Future Appointments             In 2 months Vu, Constance DASEN, MD [facility] Health Reg Ctr Infect Dis - A Dept Of . [facility], RCID   In 3 months Vicci, Barnie NOVAK, MD [facility] Health Comm Health [place] Wells - A Dept Of [facility]olynn DEL. [facility]             meloxicam  (MOBIC ) 15 MG tablet 30 tablet 2    Sig: Take 1 tablet (15 mg total) by mouth daily.     Analgesics:  COX2 Inhibitors Failed - 03/15/2024  3:45 PM      Failed - Manual Review: Labs are only required if the patient has taken medication for more than 8 weeks.      Failed - eGFR is 30 or above and within 360 days    GFR, Est African American  Date Value Ref Range Status  09/25/2020 118 > OR = 60 mL/min/1.48m2 Final   GFR, Est Non African American  Date Value Ref Range Status  09/25/2020 101 > OR = 60 mL/min/1.43m2 Final   GFR, Estimated  Date Value Ref Range Status  08/17/2021 >60 >60 mL/min Final    Comment:    (NOTE) Calculated using the CKD-EPI Creatinine Equation (2021)    eGFR  Date Value Ref Range Status  11/04/2022 88 > OR = 60 mL/min/1.42m2 Final  06/23/2022 89 >59 mL/min/1.73 Final         Passed - HGB in normal range and within 360 days     Hemoglobin  Date Value Ref Range Status  12/01/2023 14.5 13.2 - 17.1 g/dL Final  88/70/7976 83.3 13.0 - 17.7 g/dL Final         Passed - Cr in normal range and within 360 days    Creat  Date Value Ref Range Status  12/01/2023 0.99 0.70 - 1.30 mg/dL Final         Passed - HCT in normal range and within 360 days    HCT  Date Value Ref Range Status  12/01/2023 44.1 38.5 - 50.0 % Final   Hematocrit  Date Value Ref Range Status  06/23/2022 49.8 37.5 - 51.0 % Final         Passed - AST in normal range and within 360 days    AST  Date Value Ref Range Status  12/01/2023 20 10 - 35 U/L Final         Passed - ALT in normal range and within 360 days    ALT  Date Value Ref Range Status  12/01/2023 26 9 - 46 U/L Final         Passed - Patient is not pregnant      Passed - Valid encounter within last 12 months    Recent Outpatient Visits           2 weeks ago Chronic right shoulder pain   [place] Comm [facility] - A Dept Of [place]. [facility] Vicci Barnie NOVAK, MD   2 months ago Primary hypertension   [place] Comm Health [place] - A Dept Of [place]. [facility] Brien Belvie BRAVO, MD   1 year ago Severe episode of recurrent major depressive disorder, without psychotic features [facility])   [place] Comm [facility] Wellnss - A Dept Of [place]. [facility] Brien Belvie BRAVO, MD   1 year ago Primary hypertension   [place] Comm Health [place] - A Dept Of [place]. [facility] Brien Belvie BRAVO, MD   1 year ago Primary hypertension    Comm Health [place] - A Dept Of [place]. [facility] Brien Belvie BRAVO, MD       Future Appointments             In 2 months Vu, Constance DASEN, MD [facility] Health Reg Ctr Infect Dis - A Dept Of [place]. [facility], RC[facility]   In 3 months Vicci, Barnie NOVAK, MD [facility] Health Comm Health [place] - A Dept Of Jolynn DEL. [facility]             testosterone   cypionate (DEPOTESTOTERONE CYPIONATE) 100 MG/ML injection 12 mL 3    Sig: Inject 1 mL (100 mg total) into the muscle every 7 (seven) days. For IM use only     Off-Protocol Failed - 03/15/2024  3:45 PM      Failed - Medication not assigned to a protocol, review manually.      Passed -  Valid encounter within last 12 months    Recent Outpatient Visits           2 weeks ago Chronic right shoulder pain   [place] Comm Health [place] - A Dept Of [place]. [facility] Vicci Barnie NOVAK, MD   2 months ago Primary hypertension   [place] Comm [facility] [place] - A Dept Of [place]. [facility] Brien Belvie BRAVO, MD   1 year ago Severe episode of recurrent major depressive disorder, without psychotic features [facility])   [place] Comm Health Wellnss - A Dept Of [place]. [facility] Brien Belvie BRAVO, MD   1 year ago Primary hypertension   [place] Comm Health [place] - A Dept Of Commerce. [facility] Brien Belvie BRAVO, MD   1 year ago Primary hypertension    Comm Health [place] - A Dept Of [place]. [facility] Brien Belvie BRAVO, MD       Future Appointments             In 2 months Vu, Constance DASEN, MD [facility] Health Reg Ctr Infect Dis - A Dept Of [place]. [facility], RCID   In 3 months Vicci, Barnie NOVAK, MD [facility] Health Comm Health [place] - A Dept Of Jolynn DEL. [facility]             QUEtiapine  (SEROQUEL ) 100 MG tablet      Sig: Take 1 tablet (100 mg total) by mouth at bedtime.     Not Delegated - Psychiatry:  Antipsychotics - Second Generation (Atypical) - quetiapine  Failed - 03/15/2024  3:45 PM      Failed - This refill cannot be delegated      Failed - TSH in normal range and within 360 days    No results found for: TSH, POCTSH, TSHREFLEX       Failed - Lipid Panel in normal range within the last 12 months    Cholesterol, Total  Date Value Ref Range Status  12/29/2023 194 100 - 199 mg/dL  Final   LDL Cholesterol (Calc)  Date Value Ref Range Status  09/25/2020 129 (H) mg/dL (calc) Final    Comment:    Reference range: <100 . Desirable range <100 mg/dL for primary prevention;   <70 mg/dL for patients with CHD or diabetic patients  with > or = 2 CHD risk factors. SABRA LDL-C is now calculated using the Martin-Hopkins  calculation, which is a validated novel method providing  better accuracy than the Friedewald equation in the  estimation of LDL-C.  Gladis APPLETHWAITE et al. SANDREA. 7986;689(80): 2061-2068  (http://education.QuestDiagnostics.com/faq/FAQ164)    LDL Chol Calc (NIH)  Date Value Ref Range Status  12/29/2023 107 (H) 0 - 99 mg/dL Final   HDL  Date Value Ref Range Status  12/29/2023 42 >39 mg/dL Final   Triglycerides  Date Value Ref Range Status  12/29/2023 261 (H) 0 - 149 mg/dL Final         Failed - CBC within normal limits and completed in the last 12 months    WBC  Date Value Ref Range Status  12/01/2023 5.6 3.8 - 10.8 Thousand/uL Final   RBC  Date Value Ref Range Status  12/01/2023 5.18 4.20 - 5.80 Million/uL Final   Hemoglobin  Date Value Ref Range Status  12/01/2023 14.5 13.2 - 17.1 g/dL Final  88/70/7976 83.3 13.0 - 17.7 g/dL Final   HCT  Date Value  Ref Range Status  12/01/2023 44.1 38.5 - 50.0 % Final   Hematocrit  Date Value Ref Range Status  06/23/2022 49.8 37.5 - 51.0 % Final   MCHC  Date Value Ref Range Status  12/01/2023 32.9 32.0 - 36.0 g/dL Final    Comment:    For adults, a slight decrease in the calculated MCHC value (in the range of 30 to 32 g/dL) is most likely not clinically significant; however, it should be interpreted with caution in correlation with other red cell parameters and the patient's clinical condition.    [facility]  Date Value Ref Range Status  12/01/2023 28.0 27.0 - 33.0 pg Final   MCV  Date Value Ref Range Status  12/01/2023 85.1 80.0 - 100.0 fL Final  06/23/2022 86 79 - 97 fL Final   No results found  for: PLTCOUNTKUC, LABPLAT, POCPLA RDW  Date Value Ref Range Status  12/01/2023 14.8 11.0 - 15.0 % Final  06/23/2022 13.7 11.6 - 15.4 % Final         Failed - CMP within normal limits and completed in the last 12 months    Albumin  Date Value Ref Range Status  06/23/2022 5.2 (H) 3.8 - 4.9 g/dL Final   Alkaline Phosphatase  Date Value Ref Range Status  06/23/2022 117 44 - 121 IU/L Final   Alkaline phosphatase (APISO)  Date Value Ref Range Status  12/01/2023 75 35 - 144 U/L Final   ALT  Date Value Ref Range Status  12/01/2023 26 9 - 46 U/L Final   AST  Date Value Ref Range Status  12/01/2023 20 10 - 35 U/L Final   BUN  Date Value Ref Range Status  12/01/2023 13 7 - 25 mg/dL Final  88/70/7976 15 6 - 24 mg/dL Final   Calcium  Date Value Ref Range Status  12/01/2023 10.1 8.6 - 10.3 mg/dL Final   Calcium, Ion  Date Value Ref Range Status  08/17/2021 1.16 1.15 - 1.40 mmol/L Final   CO2  Date Value Ref Range Status  12/01/2023 30 20 - 32 mmol/L Final   TCO2  Date Value Ref Range Status  08/17/2021 26 22 - 32 mmol/L Final   Creat  Date Value Ref Range Status  12/01/2023 0.99 0.70 - 1.30 mg/dL Final   Glucose, Bld  Date Value Ref Range Status  12/01/2023 80 65 - 99 mg/dL Final    Comment:    .            Fasting reference interval .    Glucose-Capillary  Date Value Ref Range Status  08/17/2021 110 (H) 70 - 99 mg/dL Final    Comment:    Glucose reference range applies only to samples taken after fasting for at least 8 hours.   Potassium  Date Value Ref Range Status  12/01/2023 3.8 3.5 - 5.3 mmol/L Final   Sodium  Date Value Ref Range Status  12/01/2023 141 135 - 146 mmol/L Final  06/23/2022 140 134 - 144 mmol/L Final   Total Bilirubin  Date Value Ref Range Status  12/01/2023 0.4 0.2 - 1.2 mg/dL Final   Bilirubin Total  Date Value Ref Range Status  06/23/2022 0.5 0.0 - 1.2 mg/dL Final   Protein, ur  Date Value Ref Range Status   09/25/2020 NEGATIVE NEGATIVE Final   Total Protein  Date Value Ref Range Status  12/01/2023 7.7 6.1 - 8.1 g/dL Final  88/70/7976 7.7 6.0 - 8.5 g/dL Final   GFR, Est African  American  Date Value Ref Range Status  09/25/2020 118 > OR = 60 mL/min/1.19m2 Final   eGFR  Date Value Ref Range Status  11/04/2022 88 > OR = 60 mL/min/1.58m2 Final  06/23/2022 89 >59 mL/min/1.73 Final   GFR, Est Non African American  Date Value Ref Range Status  09/25/2020 101 > OR = 60 mL/min/1.37m2 Final   GFR, Estimated  Date Value Ref Range Status  08/17/2021 >60 >60 mL/min Final    Comment:    (NOTE) Calculated using the CKD-EPI Creatinine Equation (2021)          Passed - Completed PHQ-2 or PHQ-9 in the last 360 days      Passed - Last BP in normal range    BP Readings from Last 1 Encounters:  02/24/24 129/72         Passed - Last Heart Rate in normal range    Pulse Readings from Last 1 Encounters:  02/24/24 77         Passed - Valid encounter within last 6 months    Recent Outpatient Visits           2 weeks ago Chronic right shoulder pain   [place] Comm [facility] [place] - A Dept Of [place]. [facility] Vicci Barnie NOVAK, MD   2 months ago Primary hypertension   [place] Comm Health [place] - A Dept Of [place]. [facility] Brien Belvie BRAVO, MD   1 year ago Severe episode of recurrent major depressive disorder, without psychotic features [facility])   [place] Comm Health Wellnss - A Dept Of [place]. [facility] Brien Belvie BRAVO, MD   1 year ago Primary hypertension   [place] Comm Health [place] - A Dept Of [place]. [facility] Brien Belvie BRAVO, MD   1 year ago Primary hypertension   [place] Comm Health [facility] [place] - A Dept Of [place]. [facility] Brien Belvie BRAVO, MD       Future Appointments             In 2 months Vu, Constance DASEN, MD [facility] Health Reg Ctr Infect Dis - A Dept Of [place]. [facility], RCID    In 3 months Vicci, Barnie NOVAK, MD [facility] Health Comm Health [place] - A Dept Of Jolynn DEL. [facility]             amLODipine  (NORVASC ) 10 MG tablet 90 tablet 2    Sig: Take 1 tablet (10 mg total) by mouth daily.     Cardiovascular: Calcium Channel Blockers 2 Passed - 03/15/2024  3:45 PM      Passed - Last BP in normal range    BP Readings from Last 1 Encounters:  02/24/24 129/72         Passed - Last Heart Rate in normal range    Pulse Readings from Last 1 Encounters:  02/24/24 77         Passed - Valid encounter within last 6 months    Recent Outpatient Visits           2 weeks ago Chronic right shoulder pain   [place] Comm Health [place] - A Dept Of [place]. [facility] Vicci Barnie NOVAK, MD   2 months ago Primary hypertension   [place] Comm Health Sport[place] Acres - A Dept Of [place]. [facility] Brien Belvie BRAVO, MD   1 year ago Severe  episode of recurrent major depressive disorder, without psychotic features (HCC)   [place] Comm Health Wellnss - A Dept Of [place]. [facility] Brien Belvie BRAVO, MD   1 year ago Primary hypertension   [place] Comm Health [place] - A Dept Of [place]. [facility] Brien Belvie BRAVO, MD   1 year ago Primary hypertension   [place] Comm Health Twin Lakes - A Dept Of [place]. [facility] Brien Belvie BRAVO, MD       Future Appointments             In 2 months Vu, Constance DASEN, MD [facility] Health Reg Ctr Infect Dis - A Dept Of [place]. [facility], RCID   In 3 months Vicci, Barnie NOVAK, MD [facility] Health Comm Health [place] - A Dept Of Jolynn DEL. [facility]             cetirizine  (ZYRTEC  ALLERGY) 10 MG tablet 30 tablet 11    Sig: Take 1 tablet (10 mg total) by mouth daily.     Ear, Nose, and Throat:  Antihistamines 2 Passed - 03/15/2024  3:45 PM      Passed - Cr in normal range and within 360 days    Creat  Date Value Ref Range Status  12/01/2023  0.99 0.70 - 1.30 mg/dL Final         Passed - Valid encounter within last 12 months    Recent Outpatient Visits           2 weeks ago Chronic right shoulder pain   [place] Comm Health Wellnss - A Dept Of [place]. [facility] Vicci Barnie NOVAK, MD   2 months ago Primary hypertension   [place] Comm Health [place] Valley - A Dept Of [place]. [facility] Brien Belvie BRAVO, MD   1 year ago Severe episode of recurrent major depressive disorder, without psychotic features [facility])   [place] Comm Health Wellnss - A Dept Of [place]. [facility] Brien Belvie BRAVO, MD   1 year ago Primary hypertension   [place] Comm Health [place] Vista - A Dept Of [place]. [facility] Brien Belvie BRAVO, MD   1 year ago Primary hypertension   [place] Comm Health [place] - A Dept Of [place]. [facility] Brien Belvie BRAVO, MD       Future Appointments             In 2 months Vu, Constance DASEN, MD [facility] Health Reg Ctr Infect Dis - A Dept Of [place]. [facility], RCID   In 3 months Vicci, Barnie NOVAK, MD [facility] Health Comm Health [place] - A Dept Of Jolynn DEL. [facility]             hydrOXYzine  (ATARAX ) 10 MG tablet 30 tablet     Sig: Take 1 tablet (10 mg total) by mouth 3 (three) times daily as needed.     Ear, Nose, and Throat:  Antihistamines 2 Passed - 03/15/2024  3:45 PM      Passed - Cr in normal range and within 360 days    Creat  Date Value Ref Range Status  12/01/2023 0.99 0.70 - 1.30 mg/dL Final         Passed - Valid encounter within last 12 months    Recent Outpatient Visits           2 weeks ago  Chronic right shoulder pain   [place] Comm Health [place] - A Dept Of [place]. [facility] Vicci Barnie NOVAK, MD   2 months ago Primary hypertension   [place] Comm Health [place] - A Dept Of [place]. [facility] Brien Belvie BRAVO, MD   1 year ago Severe episode of recurrent major depressive  disorder, without psychotic features [facility])   [place] Comm Health Wellnss - A Dept Of March ARB. [facility] Brien Belvie BRAVO, MD   1 year ago Primary hypertension   [place] Comm Health [place] - A Dept Of [place]. [facility] Brien Belvie BRAVO, MD   1 year ago Primary hypertension   [place] Comm Health [place] - A Dept Of [place]. [facility] Brien Belvie BRAVO, MD       Future Appointments             In 2 months Vu, Constance DASEN, MD [facility] Health Reg Ctr Infect Dis - A Dept Of [place]. [facility], RCID   In 3 months Vicci, Barnie NOVAK, MD [facility] Health Comm Health [place] - A Dept Of Jolynn DEL. [facility]             lidocaine  4 % 60 patch 1    Sig: Place 1 patch onto the skin 2 (two) times daily.     Off-Protocol Failed - 03/15/2024  3:45 PM      Failed - Medication not assigned to a protocol, review manually.      Passed - Valid encounter within last 12 months    Recent Outpatient Visits           2 weeks ago Chronic right shoulder pain   [place] Comm Health Wellnss - A Dept Of [place]. [facility] Vicci Barnie NOVAK, MD   2 months ago Primary hypertension   [place] Comm Health [place] - A Dept Of [place]. [facility] Brien Belvie BRAVO, MD   1 year ago Severe episode of recurrent major depressive disorder, without psychotic features [facility])   [place] Comm Health Wellnss - A Dept Of [place]. [facility] Brien Belvie BRAVO, MD   1 year ago Primary hypertension   [place] Comm Health [place] City - A Dept Of [place]. [facility] Brien Belvie BRAVO, MD   1 year ago Primary hypertension   [place] Comm Health [place] - A Dept Of . [facility] Brien Belvie BRAVO, MD       Future Appointments             In 2 months Vu, Constance DASEN, MD [facility] Health Reg Ctr Infect Dis - A Dept Of . [facility], RCID   In 3 months Vicci, Barnie NOVAK, MD  [facility] Health Comm Health [place] - A Dept Of Jolynn DEL. [facility]           Analgesics:  Topicals Failed - 03/15/2024  3:45 PM      Failed - Manual Review: Labs are only required if the patient has taken medication for more than 8 weeks.      Failed - eGFR is 30 or above and within 360 days    GFR, Est African American  Date Value Ref Range Status  09/25/2020 118 > OR = 60 mL/min/1.11m2 Final   GFR, Est Non African American  Date Value Ref Range Status  09/25/2020 101 > OR = 60 mL/min/1.77m2 Final   GFR, Estimated  Date Value Ref Range Status  08/17/2021 >60 >60 mL/min Final    Comment:    (NOTE) Calculated using the CKD-EPI Creatinine Equation (2021)    eGFR  Date Value Ref Range Status  11/04/2022 88 > OR = 60 mL/min/1.24m2 Final  06/23/2022 89 >59 mL/min/1.73 Final         Passed - PLT in normal range and within 360 days    Platelets  Date Value Ref Range Status  12/01/2023 214 140 - 400 Thousand/uL Final  06/23/2022 170 150 - 450 x10E3/uL Final         Passed - HGB in normal range and within 360 days    Hemoglobin  Date Value Ref Range Status  12/01/2023 14.5 13.2 - 17.1 g/dL Final  88/70/7976 83.3 13.0 - 17.7 g/dL Final         Passed - HCT in normal range and within 360 days    HCT  Date Value Ref Range Status  12/01/2023 44.1 38.5 - 50.0 % Final   Hematocrit  Date Value Ref Range Status  06/23/2022 49.8 37.5 - 51.0 % Final         Passed - Cr in normal range and within 360 days    Creat  Date Value Ref Range Status  12/01/2023 0.99 0.70 - 1.30 mg/dL Final         Passed - Patient is not pregnant      Passed - Valid encounter within last 12 months    Recent Outpatient Visits           2 weeks ago Chronic right shoulder pain   [place] [facility] - A Dept Of [place]. [facility] Vicci Barnie NOVAK, MD   2 months ago Primary hypertension   [place] Comm Health [place] - A Dept Of [place]. [facility] Brien Belvie BRAVO, MD   1 year ago Severe episode of recurrent major depressive disorder, without psychotic features [facility])   [place] Comm [facility] Wellnss - A Dept Of [place]. [facility] Brien Belvie BRAVO, MD   1 year ago Primary hypertension   [place] Comm Health [place] - A Dept Of [place]. [facility] Brien Belvie BRAVO, MD   1 year ago Primary hypertension   [place] Comm Health [place] - A Dept Of [place]. [facility] Brien Belvie BRAVO, MD       Future Appointments             In 2 months Vu, Constance DASEN, MD [facility] Infect Dis - A Dept Of . [facility], RCID   In 3 months Vicci, Barnie NOVAK, MD [facility] Comm Health [place] - A Dept Of Jolynn DEL. [facility]             SYRINGE-NEEDLE, DISP, 3 ML (BD PLASTIPAK SYRINGE) 21G X 1 3 ML MISC 50 each 0    Sig: Use to inject 1 ml of testosterone  IM every 7 days     There is no refill protocol information for this order    Signed Prescriptions Disp Refills   famotidine  (PEPCID ) 40 MG tablet 60 tablet 2    Sig: Take 1 tablet (40 mg total) by mouth 2 (two) times daily.     Gastroenterology:  H2 Antagonists Passed - 03/15/2024  3:45 PM  Passed - Valid encounter within last 12 months    Recent Outpatient Visits           2 weeks ago Chronic right shoulder pain   [place] [facility] - A Dept Of [place]. [facility] Vicci Barnie NOVAK, MD   2 months ago Primary hypertension   [place] Comm [facility] [place] - A Dept Of [place]. [facility] Brien Belvie BRAVO, MD   1 year ago Severe episode of recurrent major depressive disorder, without psychotic features [facility])   [place] Comm [facility] Welln[facility] - A Dept Of [place]. [facility] Brien Belvie BRAVO, MD   1 year ago Primary hypertension   [place] Comm [facility] [place] - A Dept Of [place]. [facility] Brien Belvie BRAVO, MD   1 year ago  Primary hypertension   [place] Comm [facility] [place] - A Dept Of [place]. [facility] Brien Belvie BRAVO, MD       Future Appointments             In 2 months Vu, Constance DASEN, MD [facility] Reg Ctr Infect Dis - A Dept Of [place]. [facility], RC[facility]   In 3 months Vicci, Barnie NOVAK, MD [facility] Health Comm Health [place] - A Dept Of Jolynn DEL. [facility]             Blood Pressure Monitoring (BLOOD PRESSURE KIT) DEVI 1 each 0    Sig: Use to measure blood pressure     There is no refill protocol information for this order     traZODone  (DESYREL ) 50 MG tablet 90 tablet 0    Sig: Take 1 tablet (50 mg total) by mouth at bedtime as needed for sleep.     Psychiatry: Antidepressants - Serotonin Modulator Passed - 03/15/2024  3:45 PM      Passed - Completed PHQ-2 or PHQ-9 in the last 360 days      Passed - Valid encounter within last 6 months    Recent Outpatient Visits           2 weeks ago Chronic right shoulder pain   [place] [facility] - A Dept Of [place]. [facility] Vicci Barnie NOVAK, MD   2 months ago Primary hypertension   [place] Comm [facility] [place] [place] - A Dept Of [place]. [facility] Brien Belvie BRAVO, MD   1 year ago Severe episode of recurrent major depressive disorder, without psychotic features [facility])   [place] Comm [facility] Wellnss - A Dept Of [place]. [facility] Brien Belvie BRAVO, MD   1 year ago Primary hypertension   [place] Comm Health [place] - A Dept Of [place]. [facility] Brien Belvie BRAVO, MD   1 year ago Primary hypertension   [place] Comm Health [place] - A Dept Of . [facility] Brien Belvie BRAVO, MD       Future Appointments             In 2 months Vu, Constance DASEN, MD [facility] Health Reg Ctr Infect Dis - A Dept Of . [facility], RCID   In 3 months Vicci, Barnie NOVAK, MD [facility] Comm Health [place] - A Dept Of [place]olynn DEL. [facility]            Refused Prescriptions Disp Refills   metoprolol  succinate (TOPROL -XL)  25 MG 24 hr tablet 90 tablet 2    Sig: Take 1 tablet (25 mg total) by mouth daily.     Cardiovascular:  Beta Blockers Passed - 03/15/2024  3:45 PM      Passed - Last BP in normal range    BP Readings from Last 1 Encounters:  02/24/24 129/72         Passed - Last Heart Rate in normal range    Pulse Readings from Last 1 Encounters:  02/24/24 77         Passed - Valid encounter within last 6 months    Recent Outpatient Visits           2 weeks ago Chronic right shoulder pain   [place] Comm [facility] [place] - A Dept Of [place]. [facility] Vicci Barnie NOVAK, MD   2 months ago Primary hypertension   [place] Comm Health [place] - A Dept Of [place]. [facility] Brien Belvie BRAVO, MD   1 year ago Severe episode of recurrent major depressive disorder, without psychotic features [facility])   [place] Comm Health Wellnss - A Dept Of [place]. [facility] Brien Belvie BRAVO, MD   1 year ago Primary hypertension   [place] Comm Health [place] - A Dept Of [place]. [facility] Brien Belvie BRAVO, MD   1 year ago Primary hypertension   [place] Comm Health [place] - A Dept Of [place]. [facility] Brien Belvie BRAVO, MD       Future Appointments             In 2 months Vu, Constance DASEN, MD [facility] Reg Ctr Infect Dis - A Dept Of [place]. [facility], RCID   In 3 months Vicci, Barnie NOVAK, MD [facility] Comm Health [place] - A Dept Of Jolynn DEL. [facility]             hydrochlorothiazide  (HYDRODIURIL ) 12.5 MG tablet 90 tablet 1    Sig: Take 1 tablet (12.5 mg total) by mouth daily.     Cardiovascular: Diuretics - Thiazide Passed - 03/15/2024  3:45 PM      Passed - Cr in normal range and within 180 days    Creat  Date Value Ref Range Status  12/01/2023 0.99 0.70 - 1.30 mg/dL Final         Passed - K  in normal range and within 180 days    Potassium  Date Value Ref Range Status  12/01/2023 3.8 3.5 - 5.3 mmol/L Final         Passed - Na in normal range and within 180 days    Sodium  Date Value Ref Range Status  12/01/2023 141 135 - 146 mmol/L Final  06/23/2022 140 134 - 144 mmol/L Final         Passed - Last BP in normal range    BP Readings from Last 1 Encounters:  02/24/24 129/72         Passed - Valid encounter within last 6 months    Recent Outpatient Visits           2 weeks ago Chronic right shoulder pain   [place] Comm [facility] [place] - A Dept Of [place]. [facility] Vicci Barnie NOVAK, MD   2 months ago Primary hypertension   [place] Comm Health [place] - A Dept Of [place]. [facility] [place],  Belvie BRAVO, MD   1 year ago Severe episode of recurrent major depressive disorder, without psychotic features [facility])   [place] Comm Health Wellnss - A Dept Of [place]. [facility] Brien Belvie BRAVO, MD   1 year ago Primary hypertension   [place] Comm Health [place] - A Dept Of [place]. [facility] Brien Belvie BRAVO, MD   1 year ago Primary hypertension   [place] Comm Health [place]ates - A Dept Of [place]. [facility] Brien Belvie BRAVO, MD       Future Appointments             In 2 months Vu, Constance DASEN, MD [facility] Health Reg Ctr Infect Dis - A Dept Of [place]. [facility], RCID   In 3 months Vicci, Barnie NOVAK, MD [facility] Health Comm Health [place] - A Dept Of Jolynn DEL. [facility]               Requested Prescriptions  Pending Prescriptions Disp Refills   FLUoxetine  (PROZAC ) 20 MG tablet      Sig: Take 1 tablet (20 mg total) by mouth daily.     Psychiatry:  Antidepressants - SSRI Passed - 03/15/2024  3:45 PM      Passed - Completed PHQ-2 or PHQ-9 in the last 360 days      Passed - Valid encounter within last 6 months    Recent Outpatient Visits           2 weeks ago Chronic  right shoulder pain   [place] Comm [facility] Wellnss - A Dept Of [place]. [facility] Vicci Barnie NOVAK, MD   2 months ago Primary hypertension   [place] Comm Health [place] - A Dept Of [place]. [facility] Brien Belvie BRAVO, MD   1 year ago Severe episode of recurrent major depressive disorder, without psychotic features [facility])   [place] Comm Health Wellnss - A Dept Of [place]. [facility] Brien Belvie BRAVO, MD   1 year ago Primary hypertension   [place] Comm Health [place] - A Dept Of [place]. [facility] Brien Belvie BRAVO, MD   1 year ago Primary hypertension   [place] Comm Health [place] - A Dept Of [place]. [facility] Brien Belvie BRAVO, MD       Future Appointments             In 2 months Vu, Constance DASEN, MD [facility] Health Reg Ctr Infect Dis - A Dept Of [place]. [facility], RCID   In 3 months Vicci, Barnie NOVAK, MD [facility] Health Comm Health [place]es - A Dept Of Jolynn DEL. [facility]             omeprazole  (PRILOSEC) 20 MG capsule 30 capsule 3    Sig: Take 1 capsule (20 mg total) by mouth daily.     Gastroenterology: Proton Pump Inhibitors Passed - 03/15/2024  3:45 PM      Passed - Valid encounter within last 12 months    Recent Outpatient Visits           2 weeks ago Chronic right shoulder pain   [place] Comm [facility] Wellnss - A Dept Of [place]. [facility] Vicci Barnie NOVAK, MD   2 months ago Primary hypertension    Comm Health [place] - A Dept Of [place]. [facility] [place],  Belvie BRAVO, MD   1 year ago Severe episode of recurrent major depressive disorder, without psychotic features [facility])   [place] Comm Health Wellnss - A Dept Of [place]. [facility] Brien Belvie BRAVO, MD   1 year ago Primary hypertension   [place] Comm Health Emerald - A Dept Of [place]. [facility] Brien Belvie BRAVO, MD   1 year ago Primary hypertension    [place] Comm Health [place] - A Dept Of [place]. [facility] Brien Belvie BRAVO, MD       Future Appointments             In 2 months Vu, Constance DASEN, MD [facility] Health Reg Ctr Infect Dis - A Dept Of [place]. [facility], RCID   In 3 months Vicci, Barnie NOVAK, MD [facility] Health Comm Health Asp[place] - A Dept Of Jolynn DEL. [facility]             buPROPion  (WELLBUTRIN  XL) 150 MG 24 hr tablet      Sig: Take 1 tablet (150 mg total) by mouth daily.     Psychiatry: Antidepressants - bupropion  Passed - 03/15/2024  3:45 PM      Passed - Cr in normal range and within 360 days    Creat  Date Value Ref Range Status  12/01/2023 0.99 0.70 - 1.30 mg/dL Final         Passed - AST in normal range and within 360 days    AST  Date Value Ref Range Status  12/01/2023 20 10 - 35 U/L Final         Passed - ALT in normal range and within 360 days    ALT  Date Value Ref Range Status  12/01/2023 26 9 - 46 U/L Final         Passed - Completed PHQ-2 or PHQ-9 in the last 360 days      Passed - Last BP in normal range    BP Readings from Last 1 Encounters:  02/24/24 129/72         Passed - Valid encounter within last 6 months    Recent Outpatient Visits           2 weeks ago Chronic right shoulder pain   [place] Comm Health [place] - A Dept Of [place]. [facility] Vicci Barnie NOVAK, MD   2 months ago Primary hypertension   [place] Comm Health [place] - A Dept Of [place]. [facility] Brien Belvie BRAVO, MD   1 year ago Severe episode of recurrent major depressive disorder, without psychotic features [facility])   [place] Comm Health Wellnss - A Dept Of [place]. [facility] Brien Belvie BRAVO, MD   1 year ago Primary hypertension   [place] Comm Health [place] - A Dept Of [place]. [facility] Brien Belvie BRAVO, MD   1 year ago Primary hypertension   [place] Comm Health [place] - A Dept Of [place]. [facility] Brien Belvie BRAVO, MD       Future Appointments             In 2 months Vu, Constance DASEN, MD [facility] Health Reg Ctr Infect Dis - A Dept Of [place]. [facility], RCID   In 3 months Vicci, Barnie NOVAK, MD [facility] Health Comm Health [place] - A Dept Of Jolynn DEL. [facility]  meloxicam  (MOBIC ) 15 MG tablet 30 tablet 2    Sig: Take 1 tablet (15 mg total) by mouth daily.     Analgesics:  COX2 Inhibitors Failed - 03/15/2024  3:45 PM      Failed - Manual Review: Labs are only required if the patient has taken medication for more than 8 weeks.      Failed - eGFR is 30 or above and within 360 days    GFR, Est African American  Date Value Ref Range Status  09/25/2020 118 > OR = 60 mL/min/1.69m2 Final   GFR, Est Non African American  Date Value Ref Range Status  09/25/2020 101 > OR = 60 mL/min/1.29m2 Final   GFR, Estimated  Date Value Ref Range Status  08/17/2021 >60 >60 mL/min Final    Comment:    (NOTE) Calculated using the CKD-EPI Creatinine Equation (2021)    eGFR  Date Value Ref Range Status  11/04/2022 88 > OR = 60 mL/min/1.63m2 Final  06/23/2022 89 >59 mL/min/1.73 Final         Passed - HGB in normal range and within 360 days    Hemoglobin  Date Value Ref Range Status  12/01/2023 14.5 13.2 - 17.1 g/dL Final  88/70/7976 83.3 13.0 - 17.7 g/dL Final         Passed - Cr in normal range and within 360 days    Creat  Date Value Ref Range Status  12/01/2023 0.99 0.70 - 1.30 mg/dL Final         Passed - HCT in normal range and within 360 days    HCT  Date Value Ref Range Status  12/01/2023 44.1 38.5 - 50.0 % Final   Hematocrit  Date Value Ref Range Status  06/23/2022 49.8 37.5 - 51.0 % Final         Passed - AST in normal range and within 360 days    AST  Date Value Ref Range Status  12/01/2023 20 10 - 35 U/L Final         Passed - ALT in normal range and within 360 days    ALT  Date Value Ref Range Status   12/01/2023 26 9 - 46 U/L Final         Passed - Patient is not pregnant      Passed - Valid encounter within last 12 months    Recent Outpatient Visits           2 weeks ago Chronic right shoulder pain   [place] Comm Health Wellnss - A Dept Of [place]. [facility] Vicci Barnie NOVAK, MD   2 months ago Primary hypertension   [place] Comm Health [place] - A Dept Of [place]. [facility] Brien Belvie BRAVO, MD   1 year ago Severe episode of recurrent major depressive disorder, without psychotic features [facility])   [place] Comm Health Wellnss - A Dept Of [place]. [facility] Brien Belvie BRAVO, MD   1 year ago Primary hypertension   [place] Comm Health [place] - A Dept Of [place]. [facility] Brien Belvie BRAVO, MD   1 year ago Primary hypertension   [place] Comm Health [place] - A Dept Of . [facility] Brien Belvie BRAVO, MD       Future Appointments             In 2 months Vu, Constance DASEN, MD [facility] Health Reg Ctr Infect  Dis - A Dept Of [place]. [facility], RCID   In 3 months Vicci, Barnie NOVAK, MD [facility] Health Comm Health [place] - A Dept Of Jolynn DEL. [facility]             testosterone  cypionate (DEPOTESTOTERONE CYPIONATE) 100 MG/ML injection 12 mL 3    Sig: Inject 1 mL (100 mg total) into the muscle every 7 (seven) days. For IM use only     Off-Protocol Failed - 03/15/2024  3:45 PM      Failed - Medication not assigned to a protocol, review manually.      Passed - Valid encounter within last 12 months    Recent Outpatient Visits           2 weeks ago Chronic right shoulder pain   [place] Comm Health Wellnss - A Dept Of [place]. [facility] Vicci Barnie NOVAK, MD   2 months ago Primary hypertension   [place] Comm Health [place] - A Dept Of [place]. [facility] Brien Belvie BRAVO, MD   1 year ago Severe episode of recurrent major depressive disorder,  without psychotic features [facility])   [place] Comm Health Wellnss - A Dept Of [place]. [facility] Brien Belvie BRAVO, MD   1 year ago Primary hypertension   [place] Comm Health [place] - A Dept Of [place]. [facility] Brien Belvie BRAVO, MD   1 year ago Primary hypertension   [place] Comm Health [place] - A Dept Of [place]. [facility] Brien Belvie BRAVO, MD       Future Appointments             In 2 months Vu, Constance DASEN, MD [facility] Health Reg Ctr Infect Dis - A Dept Of [place]. [facility], RCID   In 3 months Vicci, Barnie NOVAK, MD [facility] Health Comm Health [place] - A Dept Of Jolynn DEL. [facility]             QUEtiapine  (SEROQUEL ) 100 MG tablet      Sig: Take 1 tablet (100 mg total) by mouth at bedtime.     Not Delegated - Psychiatry:  Antipsychotics - Second Generation (Atypical) - quetiapine  Failed - 03/15/2024  3:45 PM      Failed - This refill cannot be delegated      Failed - TSH in normal range and within 360 days    No results found for: TSH, POCTSH, TSHREFLEX       Failed - Lipid Panel in normal range within the last 12 months    Cholesterol, Total  Date Value Ref Range Status  12/29/2023 194 100 - 199 mg/dL Final   LDL Cholesterol (Calc)  Date Value Ref Range Status  09/25/2020 129 (H) mg/dL (calc) Final    Comment:    Reference range: <100 . Desirable range <100 mg/dL for primary prevention;   <70 mg/dL for patients with CHD or diabetic patients  with > or = 2 CHD risk factors. SABRA LDL-C is now calculated using the Martin-Hopkins  calculation, which is a validated novel method providing  better accuracy than the Friedewald equation in the  estimation of LDL-C.  Gladis APPLETHWAITE et al. SANDREA. 7986;689(80): 2061-2068  (http://education.QuestDiagn[employer].com/faq/FAQ164)    LDL Chol Calc (NIH)  Date Value Ref Range Status  12/29/2023 107 (H) 0 - 99 mg/dL Final   HDL  Date Value Ref Range Status  12/29/2023 42 >39 mg/dL Final   Triglycerides  Date Value Ref Range Status  12/29/2023 261 (H) 0 - 149 mg/dL Final         Failed - CBC within normal limits and completed in the last 12 months    WBC  Date Value Ref Range Status  12/01/2023 5.6 3.8 - 10.8 Thousand/uL Final   RBC  Date Value Ref Range Status  12/01/2023 5.18 4.20 - 5.80 Million/uL Final   Hemoglobin  Date Value Ref Range Status  12/01/2023 14.5 13.2 - 17.1 g/dL Final  88/70/7976 83.3 13.0 - 17.7 g/dL Final   HCT  Date Value Ref Range Status  12/01/2023 44.1 38.5 - 50.0 % Final   Hematocrit  Date Value Ref Range Status  06/23/2022 49.8 37.5 - 51.0 % Final   MCHC  Date Value Ref Range Status  12/01/2023 32.9 32.0 - 36.0 g/dL Final    Comment:    For adults, a slight decrease in the calculated MCHC value (in the range of 30 to 32 g/dL) is most likely not clinically significant; however, it should be interpreted with caution in correlation with other red cell parameters and the patient's clinical condition.    [facility]  Date Value Ref Range Status  12/01/2023 28.0 27.0 - 33.0 pg Final   MCV  Date Value Ref Range Status  12/01/2023 85.1 80.0 - 100.0 fL Final  06/23/2022 86 79 - 97 fL Final   No results found for: PLTCOUNTKUC, LABPLAT, POCPLA RDW  Date Value Ref Range Status  12/01/2023 14.8 11.0 - 15.0 % Final  06/23/2022 13.7 11.6 - 15.4 % Final         Failed - CMP within normal limits and completed in the last 12 months    Albumin  Date Value Ref Range Status  06/23/2022 5.2 (H) 3.8 - 4.9 g/dL Final   Alkaline Phosphatase  Date Value Ref Range Status  06/23/2022 117 44 - 121 IU/L Final   Alkaline phosphatase (APISO)  Date Value Ref Range Status  12/01/2023 75 35 - 144 U/L Final   ALT  Date Value Ref Range Status  12/01/2023 26 9 - 46 U/L Final   AST  Date Value Ref Range Status  12/01/2023 20 10 - 35 U/L Final   BUN  Date Value Ref Range Status  12/01/2023 13 7 - 25  mg/dL Final  88/70/7976 15 6 - 24 mg/dL Final   Calcium  Date Value Ref Range Status  12/01/2023 10.1 8.6 - 10.3 mg/dL Final   Calcium, Ion  Date Value Ref Range Status  08/17/2021 1.16 1.15 - 1.40 mmol/L Final   CO2  Date Value Ref Range Status  12/01/2023 30 20 - 32 mmol/L Final   TCO2  Date Value Ref Range Status  08/17/2021 26 22 - 32 mmol/L Final   Creat  Date Value Ref Range Status  12/01/2023 0.99 0.70 - 1.30 mg/dL Final   Glucose, Bld  Date Value Ref Range Status  12/01/2023 80 65 - 99 mg/dL Final    Comment:    .            Fasting reference interval .    Glucose-Capillary  Date Value Ref Range Status  08/17/2021 110 (H) 70 - 99 mg/dL Final    Comment:    Glucose reference range applies only to samples taken after fasting for at least 8 hours.   Potassium  Date Value Ref Range Status  12/01/2023 3.8 3.5 - 5.3  mmol/L Final   Sodium  Date Value Ref Range Status  12/01/2023 141 135 - 146 mmol/L Final  06/23/2022 140 134 - 144 mmol/L Final   Total Bilirubin  Date Value Ref Range Status  12/01/2023 0.4 0.2 - 1.2 mg/dL Final   Bilirubin Total  Date Value Ref Range Status  06/23/2022 0.5 0.0 - 1.2 mg/dL Final   Protein, ur  Date Value Ref Range Status  09/25/2020 NEGATIVE NEGATIVE Final   Total Protein  Date Value Ref Range Status  12/01/2023 7.7 6.1 - 8.1 g/dL Final  88/70/7976 7.7 6.0 - 8.5 g/dL Final   GFR, Est African American  Date Value Ref Range Status  09/25/2020 118 > OR = 60 mL/min/1.32m2 Final   eGFR  Date Value Ref Range Status  11/04/2022 88 > OR = 60 mL/min/1.58m2 Final  06/23/2022 89 >59 mL/min/1.73 Final   GFR, Est Non African American  Date Value Ref Range Status  09/25/2020 101 > OR = 60 mL/min/1.80m2 Final   GFR, Estimated  Date Value Ref Range Status  08/17/2021 >60 >60 mL/min Final    Comment:    (NOTE) Calculated using the CKD-EPI Creatinine Equation (2021)          Passed - Completed PHQ-2 or PHQ-9 in the  last 360 days      Passed - Last BP in normal range    BP Readings from Last 1 Encounters:  02/24/24 129/72         Passed - Last Heart Rate in normal range    Pulse Readings from Last 1 Encounters:  02/24/24 77         Passed - Valid encounter within last 6 months    Recent Outpatient Visits           2 weeks ago Chronic right shoulder pain   [place] Comm Health [place] - A Dept Of [place]. [facility] Vicci Barnie NOVAK, MD   2 months ago Primary hypertension   [place] Comm Health [place] - A Dept Of [place]. [facility] Brien Belvie BRAVO, MD   1 year ago Severe episode of recurrent major depressive disorder, without psychotic features [facility])   [place] Comm Health Wellnss - A Dept Of [place]. [facility] Brien Belvie BRAVO, MD   1 year ago Primary hypertension   [place] Comm Health [place] - A Dept Of C[place]. Dig[facility]ive [facility] Brien Belvie BRAVO, MD   1 year ago Primary hypertension   [place] Comm Health [place] - A Dept Of [place]. [facility] Brien Belvie BRAVO, MD       Future Appointments             In 2 months Vu, Constance DASEN, MD [facility] Health Reg Ctr Infect Dis - A Dept Of [place]. [facility], RCID   In 3 months Vicci, Barnie NOVAK, MD [facility] Health Comm Health [place] - A Dept Of Jolynn DEL. [facility]             amLODipine  (NORVASC ) 10 MG tablet 90 tablet 2    Sig: Take 1 tablet (10 mg total) by mouth daily.     Cardiovascular: Calcium Channel Blockers 2 Passed - 03/15/2024  3:45 PM      Passed - Last BP in normal range    BP Readings from Last 1 Encounters:  02/24/24 129/72         Passed - Last  Heart Rate in normal range    Pulse Readings from Last 1 Encounters:  02/24/24 77         Passed - Valid encounter within last 6 months    Recent Outpatient Visits           2 weeks ago Chronic right shoulder pain   [place] Comm [facility] [place] - A Dept Of [place]. [facility] Vicci Barnie NOVAK, MD   2 months ago Primary hypertension   [place] Comm Health [place] - A Dept Of [place]. [facility] Brien Belvie BRAVO, MD   1 year ago Severe episode of recurrent major depressive disorder, without psychotic features [facility])   [place] Comm Health Wellnss - A Dept Of [place]. [facility] Brien Belvie BRAVO, MD   1 year ago Primary hypertension   [place] Comm Health [place] - A Dept Of [place]. [facility] Brien Belvie BRAVO, MD   1 year ago Primary hypertension   [place] Comm Health [place] - A Dept Of [place]. [facility] Brien Belvie BRAVO, MD       Future Appointments             In 2 months Vu, Constance DASEN, MD [facility] Health Reg Ctr Infect Dis - A Dept Of [place]. [facility], RCID   In 3 months Vicci, Barnie NOVAK, MD [facility] Health Comm Health [place] - A Dept Of [place] DEL. [facility]             cetirizine  (ZYRTEC  ALLERGY) 10 MG tablet 30 tablet 11    Sig: Take 1 tablet (10 mg total) by mouth daily.     Ear, Nose, and Throat:  Antihistamines 2 Passed - 03/15/2024  3:45 PM      Passed - Cr in normal range and within 360 days    Creat  Date Value Ref Range Status  12/01/2023 0.99 0.70 - 1.30 mg/dL Final         Passed - Valid encounter within last 12 months    Recent Outpatient Visits           2 weeks ago Chronic right shoulder pain   [place] Comm Health Wellnss - A Dept Of [place]. [facility] Vicci Barnie NOVAK, MD   2 months ago Primary hypertension   [place] Comm Health [place] - A Dept Of [place]. [facility] Brien Belvie BRAVO, MD   1 year ago Severe episode of recurrent major depressive disorder, without psychotic features [facility])   [place] Comm Health Wellnss - A Dept Of [place]. [facility] Brien Belvie BRAVO, MD   1 year ago Primary hypertension   [place] Comm Health [place] - A Dept Of [place]. [facility] Brien Belvie BRAVO, MD   1 year ago Primary hypertension   [place] Comm Health [place] - A Dept Of . [facility] Brien Belvie BRAVO, MD       Future Appointments             In 2 months Vu, Constance DASEN, MD [facility] Health Reg Ctr Infect Dis - A Dept Of . [facility], RCID   In 3 months Vicci, Barnie NOVAK, MD [facility] Health Comm Health [place] - A Dept Of [place] DEL. [facility]             hydrOXYzine  (  ATARAX ) 10 MG tablet 30 tablet     Sig: Take 1 tablet (10 mg total) by mouth 3 (three) times daily as needed.     Ear, Nose, and Throat:  Antihistamines 2 Passed - 03/15/2024  3:45 PM      Passed - Cr in normal range and within 360 days    Creat  Date Value Ref Range Status  12/01/2023 0.99 0.70 - 1.30 mg/dL Final         Passed - Valid encounter within last 12 months    Recent Outpatient Visits           2 weeks ago Chronic right shoulder pain   [place] Comm Health Wellnss - A Dept Of [place]. [facility] Vicci Barnie NOVAK, MD   2 months ago Primary hypertension   [place] Comm Health [place] - A Dept Of [place]. [facility] Brien Belvie BRAVO, MD   1 year ago Severe episode of recurrent major depressive disorder, without psychotic features [facility])   [place] Comm Health Wellnss - A Dept Of [place]. [facility] Brien Belvie BRAVO, MD   1 year ago Primary hypertension   [place] Comm Health [place] - A Dept Of [place]. [facility] Brien Belvie BRAVO, MD   1 year ago Primary hypertension   [place] Comm Health [place] - A Dept Of [place]. [facility] Brien Belvie BRAVO, MD       Future Appointments             In 2 months Vu, Constance DASEN, MD [facility] Health Reg Ctr Infect Dis - A Dept Of [place]. [facility], RCID   In 3 months Vicci, Barnie NOVAK, MD [facility] Health Comm Health [place] - A Dept Of [place]ol[place] DEL. [facility]             lidocaine  4 % 60  patch 1    Sig: Place 1 patch onto the skin 2 (two) times daily.     Off-Protocol Failed - 03/15/2024  3:45 PM      Failed - Medication not assigned to a protocol, review manually.      Passed - Valid encounter within last 12 months    Recent Outpatient Visits           2 weeks ago Chronic right shoulder pain   [place] Comm Health Wellnss - A Dept Of [place]. [facility] Vicci Barnie NOVAK, MD   2 months ago Primary hypertension   [place] Comm Health [place] - A Dept Of [place]. [facility] Brien Belvie BRAVO, MD   1 year ago Severe episode of recurrent major depressive disorder, without psychotic features [facility])   [place] Comm Health Wellnss - A Dept Of [place]. [facility] Brien Belvie BRAVO, MD   1 year ago Primary hypertension   [place] Comm Health [place] - A Dept Of [place]. [facility] Brien Belvie BRAVO, MD   1 year ago Primary hypertension    Comm Health [place] - A Dept Of [place]. [facility] Brien Belvie BRAVO, MD       Future Appointments             In 2 months Vu, Constance DASEN, MD [facility] Health Reg Ctr Infect Dis - A Dept Of . [employer], RCID   In 3 months Vicci Barnie NOVAK,  MD [facility] Health Comm Health [place] - A Dept Of [place]. [facility]           Analgesics:  Topicals Failed - 03/15/2024  3:45 PM      Failed - Manual Review: Labs are only required if the patient has taken medication for more than 8 weeks.      Failed - eGFR is 30 or above and within 360 days    GFR, Est African American  Date Value Ref Range Status  09/25/2020 118 > OR = 60 mL/min/1.12m2 Final   GFR, Est Non African American  Date Value Ref Range Status  09/25/2020 101 > OR = 60 mL/min/1.82m2 Final   GFR, Estimated  Date Value Ref Range Status  08/17/2021 >60 >60 mL/min Final    Comment:    (NOTE) Calculated using the CKD-EPI Creatinine Equation (2021)    eGFR  Date Value Ref  Range Status  11/04/2022 88 > OR = 60 mL/min/1.76m2 Final  06/23/2022 89 >59 mL/min/1.73 Final         Passed - PLT in normal range and within 360 days    Platelets  Date Value Ref Range Status  12/01/2023 214 140 - 400 Thousand/uL Final  06/23/2022 170 150 - 450 x10E3/uL Final         Passed - HGB in normal range and within 360 days    Hemoglobin  Date Value Ref Range Status  12/01/2023 14.5 13.2 - 17.1 g/dL Final  88/70/7976 83.3 13.0 - 17.7 g/dL Final         Passed - HCT in normal range and within 360 days    HCT  Date Value Ref Range Status  12/01/2023 44.1 38.5 - 50.0 % Final   Hematocrit  Date Value Ref Range Status  06/23/2022 49.8 37.5 - 51.0 % Final         Passed - Cr in normal range and within 360 days    Creat  Date Value Ref Range Status  12/01/2023 0.99 0.70 - 1.30 mg/dL Final         Passed - Patient is not pregnant      Passed - Valid encounter within last 12 months    Recent Outpatient Visits           2 weeks ago Chronic right shoulder pain   [place] Comm Health Wellnss - A Dept Of [place]. [facility] Vicci Barnie NOVAK, MD   2 months ago Primary hypertension   [place] Comm Health [place] - A Dept Of [place]. [facility] Brien Belvie BRAVO, MD   1 year ago Severe episode of recurrent major depressive disorder, without psychotic features [facility])   [place] Comm Health Wellnss - A Dept Of [place]. [facility] Brien Belvie BRAVO, MD   1 year ago Primary hypertension   [place] Comm Health [place] - A Dept Of [place]. [facility] Brien Belvie BRAVO, MD   1 year ago Primary hypertension   [place] Comm Health [place] - A Dept Of [place]. [facility] Brien Belvie BRAVO, MD       Future Appointments             In 2 months Vu, Constance DASEN, MD [facility] Health Reg Ctr Infect Dis - A Dept Of [place]. [facility], RCID   In 3 months Vicci, Barnie NOVAK, MD [facility] Health Comm Health  [place] - A Dept  Of [place]. [facility]             SYRINGE-NEEDLE, DISP, 3 ML (BD PLASTIPAK SYRINGE) 21G X 1 3 ML MISC 50 each 0    Sig: Use to inject 1 ml of testosterone  IM every 7 days     There is no refill protocol information for this order    Signed Prescriptions Disp Refills   famotidine  (PEPCID ) 40 MG tablet 60 tablet 2    Sig: Take 1 tablet (40 mg total) by mouth 2 (two) times daily.     Gastroenterology:  H2 Antagonists Passed - 03/15/2024  3:45 PM      Passed - Valid encounter within last 12 months    Recent Outpatient Visits           2 weeks ago Chronic right shoulder pain   [place] Comm Health Wellnss - A Dept Of [place]. [facility] Vicci Barnie NOVAK, MD   2 months ago Primary hypertension   [place] Comm Health [place] - A Dept Of [place]. [facility] Brien Belvie BRAVO, MD   1 year ago Severe episode of recurrent major depressive disorder, without psychotic features [facility])   [place] Comm Health Wellnss - A Dept Of [place]. [facility] Brien Belvie BRAVO, MD   1 year ago Primary hypertension   [place] Comm Health [place] - A Dept Of [place]. [facility] Brien Belvie BRAVO, MD   1 year ago Primary hypertension   [place] Comm Health [place] - A Dept Of [place]. [facility] Brien Belvie BRAVO, MD       Future Appointments             In 2 months Vu, Constance DASEN, MD [facility] Health Reg Ctr Infect Dis - A Dept Of [place]. [facility], RCID   In 3 months Vicci, Barnie NOVAK, MD [facility] Health Comm Health [place] - A Dept Of Jolynn DEL. [facility]             Blood Pressure Monitoring (BLOOD PRESSURE KIT) DEVI 1 each 0    Sig: Use to measure blood pressure     There is no refill protocol information for this order     traZODone  (DESYREL ) 50 MG tablet 90 tablet 0    Sig: Take 1 tablet (50 mg total) by mouth at bedtime as needed for sleep.     Psychiatry:  Antidepressants - Serotonin Modulator Passed - 03/15/2024  3:45 PM      Passed - Completed PHQ-2 or PHQ-9 in the last 360 days      Passed - Valid encounter within last 6 months    Recent Outpatient Visits           2 weeks ago Chronic right shoulder pain   [place] Comm Health Wellnss - A Dept Of [place]. [facility] Vicci Barnie NOVAK, MD   2 months ago Primary hypertension   [place] Comm Health [place] - A Dept Of [place]. [facility] Brien Belvie BRAVO, MD   1 year ago Severe episode of recurrent major depressive disorder, without psychotic features [facility])   [place] Comm Health Wellnss - A Dept Of [place]. [facility] Brien Belvie BRAVO, MD   1 year ago Primary hypertension    Comm Health [place] - A Dept Of . [facility] Brien Belvie BRAVO, MD  1 year ago Primary hypertension   [place] Comm Health [place] - A Dept Of [place]. [facility] Brien Belvie BRAVO, MD       Future Appointments             In 2 months Vu, Constance DASEN, MD [facility] Health Reg Ctr Infect Dis - A Dept Of [place]. [facility], RCID   In 3 months Vicci, Barnie NOVAK, MD [facility] Health Comm Health [place] - A Dept Of Jolynn DEL. [facility]            Refused Prescriptions Disp Refills   metoprolol  succinate (TOPROL -XL) 25 MG 24 hr tablet 90 tablet 2    Sig: Take 1 tablet (25 mg total) by mouth daily.     Cardiovascular:  Beta Blockers Passed - 03/15/2024  3:45 PM      Passed - Last BP in normal range    BP Readings from Last 1 Encounters:  02/24/24 129/72         Passed - Last Heart Rate in normal range    Pulse Readings from Last 1 Encounters:  02/24/24 77         Passed - Valid encounter within last 6 months    Recent Outpatient Visits           2 weeks ago Chronic right shoulder pain   [place] Comm Health [place] - A Dept Of [place]. [facility] Vicci Barnie NOVAK, MD   2 months ago  Primary hypertension   [place] Comm Health [place] - A Dept Of [place]. [facility] Brien Belvie BRAVO, MD   1 year ago Severe episode of recurrent major depressive disorder, without psychotic features [facility])   [place] Comm Health Wellnss - A Dept Of [place]. [facility] Brien Belvie BRAVO, MD   1 year ago Primary hypertension   [place] Comm Health [place] - A Dept Of [place]. [facility] Brien Belvie BRAVO, MD   1 year ago Primary hypertension   [place] Comm Health [place] - A Dept Of [place]. [facility] Brien Belvie BRAVO, MD       Future Appointments             In 2 months Vu, Constance DASEN, MD [facility] Health Reg Ctr Infect Dis - A Dept Of . [facility], RCID   In 3 months Vicci, Barnie NOVAK, MD [facility] Health Comm Health [place] - A Dept Of Jolynn DEL. [facility]             hydrochlorothiazide  (HYDRODIURIL ) 12.5 MG tablet 90 tablet 1    Sig: Take 1 tablet (12.5 mg total) by mouth daily.     Cardiovascular: Diuretics - Thiazide Passed - 03/15/2024  3:45 PM      Passed - Cr in normal range and within 180 days    Creat  Date Value Ref Range Status  12/01/2023 0.99 0.70 - 1.30 mg/dL Final         Passed - K in normal range and within 180 days    Potassium  Date Value Ref Range Status  12/01/2023 3.8 3.5 - 5.3 mmol/L Final         Passed - Na in normal range and within 180 days    Sodium  Date Value Ref Range Status  12/01/2023 141 135 - 146 mmol/L Final  06/23/2022 140 134 - 144 mmol/L  Final         Passed - Last BP in normal range    BP Readings from Last 1 Encounters:  02/24/24 129/72         Passed - Valid encounter within last 6 months    Recent Outpatient Visits           2 weeks ago Chronic right shoulder pain   [place] Comm Health [place] - A Dept Of [place]. [facility] Vicci Barnie NOVAK, MD   2 months ago Primary hypertension   [place] Comm Health [place] - A  Dept Of [place]. [facility] Brien Belvie BRAVO, MD   1 year ago Severe episode of recurrent major depressive disorder, without psychotic features [facility])   [place] Comm Health Wellnss - A Dept Of [place]. [facility] Brien Belvie BRAVO, MD   1 year ago Primary hypertension   [place] Comm Health [place] - A Dept Of [place]. [facility] Brien Belvie BRAVO, MD   1 year ago Primary hypertension    Comm Health [place] - A Dept Of [place]. [facility] Brien Belvie BRAVO, MD       Future Appointments             In 2 months Vu, Constance DASEN, MD [facility] Health Reg Ctr Infect Dis - A Dept Of [place]. [facility], RCID   In 3 months Vicci, Barnie NOVAK, MD [facility] Comm Health [place] - A Dept Of Jolynn DEL. [facility]

## 2024-03-17 ENCOUNTER — Telehealth: Payer: Self-pay | Admitting: Internal Medicine

## 2024-03-17 NOTE — Telephone Encounter (Signed)
 I received a refill request for testosterone  injections.  Please clarify with patient who was prescribing this for him.  Looks like the last prescription was filled by his infectious disease specialist Dr. Overton.

## 2024-03-19 NOTE — Telephone Encounter (Signed)
 Called but no answer. LVM to call back.

## 2024-03-20 NOTE — Telephone Encounter (Signed)
 Spoke with patient . Patient said that he has been getting this medication from Dr. Brien. Advised patient the Dr. Overton write for testosterone  for him last. Advised patient to reach out to him for refills.

## 2024-03-20 NOTE — Telephone Encounter (Signed)
 Called but no answer. LVM to call back.

## 2024-03-21 ENCOUNTER — Encounter

## 2024-03-21 ENCOUNTER — Telehealth: Payer: Self-pay

## 2024-03-21 MED ORDER — "BD PLASTIPAK SYRINGE 21G X 1"" 3 ML MISC": 0 refills | Status: DC

## 2024-03-21 MED ORDER — TESTOSTERONE CYPIONATE 100 MG/ML IM SOLN: 100.0000 mg | INTRAMUSCULAR | 1 refills | Status: DC

## 2024-03-21 NOTE — Telephone Encounter (Signed)
 Rf sent on testosterone .

## 2024-03-21 NOTE — Telephone Encounter (Signed)
 Called but no answer. LVM informing that the requested prescription has been sent to the pharmacy. Patient expressed verbal understanding.

## 2024-03-21 NOTE — Telephone Encounter (Signed)
 No show for Pre-Visit. Patient was called twice and two messages were left to call and reschedule your PV appointment before 5:00 today.If no response a no show letter will be mailed and procedure will be cancelled per [facility] guidelines.

## 2024-03-29 ENCOUNTER — Encounter

## 2024-03-29 ENCOUNTER — Telehealth: Payer: Self-pay

## 2024-03-29 ENCOUNTER — Telehealth: Payer: Self-pay | Admitting: *Deleted

## 2024-03-29 NOTE — Telephone Encounter (Signed)
 Hi Dr. San and Norleen,  This patient was just added to my PV schedule tomorrow (3rd time rescheduling) for a screening colonoscopy with Dr. JAYSON on 9/10 in the [facility] In [facility]- it is noted he has a MedTronic Pacemaker for Bradycardia, PAC's, High AV Block.  There is mention of an Echo at another facility which I see no report.  It is recommended he have a repeat echo several times from [facility] which I also see no report. I did note a pacemaker download from 10/11/23 cardio implant check  which states he may have been in A Tach/A Flutter.  Please advise if you would like this patient to have cardiac clearance before proceeding, OV, or if he is cleared to proceed. Thank you, Nia Nathaniel

## 2024-03-29 NOTE — Telephone Encounter (Signed)
 Pt was a virtual call but did  not go on virtual call. Attempted to reach pt after and had to LM with callback # and instructions to call # by end of day to reschedule PV or procedure will be canceled

## 2024-03-29 NOTE — Telephone Encounter (Signed)
 Pod B triage:  Please see note from JNulty, CRNA regarding getting cardiac clearance for this patient before upcoming colonoscopy next week.  Patient was just added to my PV schedule for tomorrow, cardiology notes under [facility]. Thank you

## 2024-03-29 NOTE — Telephone Encounter (Signed)
 Cardiac clearance request faxed to Dr. Lilian with [facility].

## 2024-03-30 ENCOUNTER — Ambulatory Visit (AMBULATORY_SURGERY_CENTER)

## 2024-03-30 ENCOUNTER — Encounter

## 2024-03-30 ENCOUNTER — Telehealth: Payer: Self-pay | Admitting: Gastroenterology

## 2024-03-30 VITALS — Ht 70.0 in | Wt 213.0 lb

## 2024-03-30 DIAGNOSIS — Z1211 Encounter for screening for malignant neoplasm of colon: Secondary | ICD-10-CM

## 2024-03-30 MED ORDER — PEG 3350-KCL-NA BICARB-NACL 420 G PO SOLR: 4000.0000 mL | Freq: Once | ORAL | 0 refills | Status: AC

## 2024-03-30 MED ORDER — BISACODYL EC 5 MG PO TBEC: 5.0000 mg | DELAYED_RELEASE_TABLET | ORAL | 0 refills | Status: AC

## 2024-03-30 NOTE — Telephone Encounter (Signed)
 Inbound call from patient stating he thought his appointment was a telephone call and it was scheduled for an in person. Went ahead and rescheduled him for today 03/30/24 at 3:30pm Please advise  Thank you

## 2024-03-30 NOTE — Progress Notes (Signed)
 No egg or soy allergy known to patient  No issues known to pt with past sedation with any surgeries or procedures Patient denies ever being told they had issues or difficulty with intubation  No FH of Malignant Hyperthermia Pt is not on diet pills Pt is not on  home 02  Pt is not on blood thinners  Pt denies issues with constipation   No A fib or A flutter; Does have a pacemaker -Medtronic for bradycardia, PAC's high AV block.  Will need cardiac clearance before procedure per Mathilda, CRNA.  Request for clearance was faxed to cardiologist.  Patient informed if we do not get clearance by 04/02/24, procedure will need to be rescheduled as he would need to start bowel prep on 9/9.  He verbalized understanding.  Have any cardiac testing pending--No Pt can ambulate  Pt denies use of chewing tobacco Discussed diabetic I weight loss medication holds Discussed NSAID holds Checked BMI Pt instructed to use Singlecare.com or GoodRx for a price reduction on prep  Patient's chart reviewed ).

## 2024-04-02 ENCOUNTER — Telehealth: Payer: Self-pay | Admitting: Gastroenterology

## 2024-04-02 ENCOUNTER — Telehealth: Payer: Self-pay

## 2024-04-02 DIAGNOSIS — Z1211 Encounter for screening for malignant neoplasm of colon: Secondary | ICD-10-CM

## 2024-04-02 NOTE — Telephone Encounter (Signed)
 Telephone call to patient.  Spoke with patient and Paul Silversmith, LPN at [facility] in [facility] with patient's permission.  Nurse states she received the RX for the colonoscopy prep, but since he is a resident at [facility], she will need an order to administer the prep.  Requesting the order be faxed to [facility] at [phone], [name]. Colonoscopy was r/s'd to 05/03/24 @ 1500 with Dr. San d/t patient needed cardiac clearance before procedure (he has an appt with his cardiologist on 9/11).  Will need to send new prep instructions with administration orders.

## 2024-04-02 NOTE — Telephone Encounter (Signed)
 Received call from patient and nurse at the facility he's staying at requesting changes to lidocaine  patch Rx. Advised that they would need to contact the office of Dr. Barnie Louder and provided them with her office phone number.   Anaisha Mago, BSN, RN

## 2024-04-02 NOTE — Telephone Encounter (Signed)
 Called Dr. Atilano office [phone]) to follow up on cardiac clearance request. Patient had not been seen in over a year and needed an updated OV. OV is scheduled for 04/05/24 at 2 pm. Patient has already rescheduled his endoscopic procedure to October. I have requested that cardiology note be faxed to us  after Thursday visit. I provided 2nd floor fax number with ATTN to me.

## 2024-04-02 NOTE — Telephone Encounter (Signed)
 Received call from patient and nurse regarding prep medication. They are requesting Order confirmation details , how and when prep should be distributed to patient, [phone] is callback, Order details should be faxed to [phone]. Please review and advise  Thank you

## 2024-04-02 NOTE — Telephone Encounter (Signed)
 Good Afternoon Dr.Cirigliano   Patient called stating he is going to have to cancel/reschedule upcoming colonoscopy on 9/10 due to having an appointment with his cardiologist on 04/05/24 for clearance.  Rescheduled for 05/03/24 Please advise  Thank you

## 2024-04-04 ENCOUNTER — Encounter: Admitting: Gastroenterology

## 2024-04-06 ENCOUNTER — Telehealth: Payer: Self-pay

## 2024-04-06 DIAGNOSIS — G8929 Other chronic pain: Secondary | ICD-10-CM

## 2024-04-06 MED ORDER — LIDOCAINE 4 % EX PTCH: 1.0000 | MEDICATED_PATCH | Freq: Every day | CUTANEOUS | 1 refills | Status: AC | PRN

## 2024-04-06 NOTE — Telephone Encounter (Signed)
 Copied from CRM #[id number]. Topic: Clinical - Medication Question >> Apr 02, 2024  4:29 PM Dedra B wrote: Reason for CRM: Pt would like orders for lidocaine  patches to be switched from 1 patch twice daily to as needed. He said he does not them twice per day.

## 2024-04-06 NOTE — Addendum Note (Signed)
 Addended by: VICCI SOBER B on: 04/06/2024 01:36 PM   Modules accepted: Orders

## 2024-04-09 NOTE — Telephone Encounter (Signed)
 Called & spoke to the patient. Verified name & DOB. Informed of refill sent to the pharmacy with the updated instructions. Patient expressed verbal understanding.

## 2024-04-10 NOTE — Telephone Encounter (Signed)
 Cardiology records available in Care Everywhere. Per 04/05/24 note:  Plan: Continue current medical therapy Low cardiac risk for colonoscopy Echocardiogram to assess heart function Patient was advised to visit ED or contact this clinic should any significant or worsening cardiac symptoms occur before next visit.  Follow up visit: 1 year or sooner as needed  Darliss Raymond, PA-C [facility] Electronically signed by Josette Earnie Silvan, PA at 04/05/2024 3:05 PM EDT

## 2024-04-13 ENCOUNTER — Telehealth: Payer: Self-pay

## 2024-04-13 NOTE — Telephone Encounter (Signed)
 Copied from CRM [id number]. Topic: General - Other >> Apr 13, 2024  1:34 PM Pinkey ORN wrote: Reason for CRM: Message For  Barnie KATHEE Louder, MD

## 2024-04-13 NOTE — Telephone Encounter (Signed)
 Received fax from Josette Silvan, [place] with [facility], stating froma cardiac standpoint this patient is stable/not stable and should be considered a LOW risk for cardiac complications of the planned procedure. Patient has pacemaker in good function.' Patient has already had his PV & been rescheduled for his procedure.

## 2024-04-16 NOTE — Telephone Encounter (Signed)
 Can you resend. No info can be seen in the crm

## 2024-04-16 NOTE — Telephone Encounter (Signed)
 Hello Paul Blake and Dr. San, Please review and clear if appropriate.  Patient has a colonoscopy with you on 10/9 and cardiac clearance was requested.  He saw his cardiologist on 9/11 and notes are in epic. Thank you, Mali Eppard

## 2024-04-17 ENCOUNTER — Telehealth: Payer: Self-pay

## 2024-04-17 NOTE — Telephone Encounter (Signed)
 Copied from CRM [id number]. Topic: General - Other >> Apr 13, 2024  1:34 PM Pinkey ORN wrote: Reason for CRM: Message For  Barnie KATHEE Louder, MD >> Apr 13, 2024  1:37 PM Asaf Elmquist wrote: [place] LPN [phone] [facility].  Called on behalf of some medications that were refilled for the patient. States the physical health will be managed by Barnie KATHEE Louder, MD and his mental health + medications will be managed by Angela Casto at [facility].   If there's any questions or concerns feel free to contact [place] LPN

## 2024-04-25 MED ORDER — PEG 3350-KCL-NA BICARB-NACL 420 G PO SOLR: 4000.0000 mL | Freq: Once | ORAL | 0 refills | Status: AC

## 2024-05-03 ENCOUNTER — Encounter: Admitting: Gastroenterology

## 2024-05-03 ENCOUNTER — Other Ambulatory Visit: Payer: Self-pay

## 2024-05-03 DIAGNOSIS — Z1211 Encounter for screening for malignant neoplasm of colon: Secondary | ICD-10-CM

## 2024-05-28 ENCOUNTER — Encounter: Payer: Self-pay | Admitting: Radiology

## 2024-06-06 ENCOUNTER — Ambulatory Visit: Payer: MEDICAID | Admitting: Internal Medicine

## 2024-06-11 ENCOUNTER — Telehealth: Payer: Self-pay | Admitting: Critical Care Medicine

## 2024-06-11 DIAGNOSIS — G8929 Other chronic pain: Secondary | ICD-10-CM

## 2024-06-11 DIAGNOSIS — J302 Other seasonal allergic rhinitis: Secondary | ICD-10-CM

## 2024-06-11 DIAGNOSIS — I10 Essential (primary) hypertension: Secondary | ICD-10-CM

## 2024-06-11 DIAGNOSIS — M545 Low back pain, unspecified: Secondary | ICD-10-CM

## 2024-06-11 DIAGNOSIS — K219 Gastro-esophageal reflux disease without esophagitis: Secondary | ICD-10-CM

## 2024-06-11 NOTE — Telephone Encounter (Unsigned)
 Copied from CRM #[id number]. Topic: Clinical - Medication Refill >> Jun 11, 2024  1:01 PM Paul Blake wrote: Medication: Testerone 100 mg/ml  Has the patient contacted their pharmacy? Yes (Agent: If no, request that the patient contact the pharmacy for the refill. If patient does not wish to contact the pharmacy document the reason why and proceed with request.) (Agent: If yes, when and what did the pharmacy advise?)  This is the patient's preferred pharmacy:   [facility] - [place], [place] - [address] [place] [address] Phone: [phone] Fax: [phone]  Is this the correct pharmacy for this prescription? Yes If no, delete pharmacy and type the correct one.   Has the prescription been filled recently? No  Is the patient out of the medication? Yes  Has the patient been seen for an appointment in the last year OR does the patient have an upcoming appointment? Yes  Can we respond through MyChart? Yes  Agent: Please be advised that Rx refills may take up to 3 business days. We ask that you follow-up with your pharmacy.

## 2024-06-11 NOTE — Telephone Encounter (Unsigned)
 Copied from CRM #[id number]. Topic: Clinical - Medication Refill >> Jun 11, 2024  1:15 PM Mia F wrote: Medication: amLODipine  (NORVASC ) 10 MG tablet   famotidine  (PEPCID ) 40 MG tablet   hydrochlorothiazide  (HYDRODIURIL ) 12.5 MG tablet  meloxicam  (MOBIC ) 15 MG tablet   metoprolol  succinate (TOPROL -XL) 25 MG 24 hr tablet  omeprazole  (PRILOSEC) 20 MG capsule   cetirizine  (ZYRTEC  ALLERGY) 10 MG tablet    Has the patient contacted their pharmacy? Yes (Agent: If no, request that the patient contact the pharmacy for the refill. If patient does not wish to contact the pharmacy document the reason why and proceed with request.) (Agent: If yes, when and what did the pharmacy advise?)  This is the patient's preferred pharmacy:  [facility] - [place], [place] - [address] [place]  [address] Phone: [phone] Fax: [phone]  Is this the correct pharmacy for this prescription? Yes If no, delete pharmacy and type the correct one.   Has the prescription been filled recently? Yes  Is the patient out of the medication? No  Has the patient been seen for an appointment in the last year OR does the patient have an upcoming appointment? Yes  Can we respond through MyChart? No  Agent: Please be advised that Rx refills may take up to 3 business days. We ask that you follow-up with your pharmacy.

## 2024-06-14 MED ORDER — FAMOTIDINE 40 MG PO TABS: 40.0000 mg | ORAL_TABLET | Freq: Two times a day (BID) | ORAL | 2 refills | Status: AC

## 2024-06-14 MED ORDER — AMLODIPINE BESYLATE 10 MG PO TABS: 10.0000 mg | ORAL_TABLET | Freq: Every day | ORAL | 1 refills | Status: AC

## 2024-06-14 MED ORDER — MELOXICAM 15 MG PO TABS: 15.0000 mg | ORAL_TABLET | Freq: Every day | ORAL | 2 refills | Status: DC

## 2024-06-14 NOTE — Telephone Encounter (Signed)
 Requested Prescriptions  Pending Prescriptions Disp Refills   amLODipine  (NORVASC ) 10 MG tablet 90 tablet 1    Sig: Take 1 tablet (10 mg total) by mouth daily.     Cardiovascular: Calcium Channel Blockers 2 Passed - 06/14/2024 10:05 AM      Passed - Last BP in normal range    BP Readings from Last 1 Encounters:  02/24/24 129/72         Passed - Last Heart Rate in normal range    Pulse Readings from Last 1 Encounters:  02/24/24 77         Passed - Valid encounter within last 6 months    Recent Outpatient Visits           3 months ago Chronic right shoulder pain   [facility] - [facility] Dept Of [place]. [facility] Vicci Barnie NOVAK, MD   5 months ago Primary hypertension   [place] [facility] [place] - A Dept Of [place]. [facility] Brien Belvie BRAVO, MD   1 year ago Severe episode of recurrent major depressive disorder, without psychotic features [facility])   [place] [facility] - A Dept Of [place]. [facility] Brien Belvie BRAVO, MD   1 year ago Primary hypertension   [place] [facility] [place] - A Dept Of [place]. [facility] Brien Belvie BRAVO, MD   1 year ago Primary hypertension   [place] [facility] [place] - A Dept Of [place]. [facility] Brien Belvie BRAVO, MD       Future Appointments             In 2 weeks Vicci Barnie NOVAK, MD [facility] Shelly - A Dept Of Jolynn DEL. [facility], [place]   In 4 weeks Vu, Constance DASEN, MD [facility] Infect Dis - A Dept Of [place]. [facility], RCID             famotidine  (PEPCID ) 40 MG tablet 60 tablet 2    Sig: Take 1 tablet (40 mg total) by mouth 2 (two) times daily.     Gastroenterology:  H2 Antagonists Passed - 06/14/2024 10:05 AM      Passed - Valid encounter within last 12 months    Recent Outpatient Visits           3 months ago Chronic right shoulder pain   [place] [facility]  [place] - A Dept Of [place]. [facility] Vicci Barnie NOVAK, MD   5 months ago Primary hypertension   [place] [facility] [place] - A Dept Of [place]. [facility] Brien Belvie BRAVO, MD   1 year ago Severe episode of recurrent major depressive disorder, without psychotic features [facility])   [place] [facility] - A Dept Of [place]. [facility] Brien Belvie BRAVO, MD   1 year ago Primary hypertension    [facility] [place] - A Dept Of [place]. [facility] Brien Belvie BRAVO, MD   1 year ago Primary hypertension    [facility] [place] - A Dept Of [place]. [facility] Brien Belvie BRAVO, MD       Future Appointments             In 2 weeks Vicci Barnie NOVAK, MD [facility] - A Dept Of [place]ol[place]  [facility] [place]   In 4 weeks Vu, Constance DASEN, MD [facility] Health Reg Ctr Infect Dis - A Dept Of [place]. [facility], RCID             meloxicam  (MOBIC ) 15 MG tablet 30 tablet 2    Sig: Take 1 tablet (15 mg total) by mouth daily.     Analgesics:  COX2 Inhibitors Failed - 06/14/2024 10:05 AM      Failed - Manual Review: Labs are only required if the patient has taken medication for more than 8 weeks.      Failed - eGFR is 30 or above and within 360 days    GFR, Est African American  Date Value Ref Range Status  09/25/2020 118 > OR = 60 mL/min/1.70m2 Final   GFR, Est Non African American  Date Value Ref Range Status  09/25/2020 101 > OR = 60 mL/min/1.86m2 Final   GFR, Estimated  Date Value Ref Range Status  08/17/2021 >60 >60 mL/min Final    Comment:    (NOTE) Calculated using the CKD-EPI Creatinine Equation (2021)    eGFR  Date Value Ref Range Status  11/04/2022 88 > OR = 60 mL/min/1.8m2 Final  06/23/2022 89 >59 mL/min/1.73 Final         Passed - HGB in normal range and within 360 days    Hemoglobin  Date Value Ref Range Status  12/01/2023 14.5  13.2 - 17.1 g/dL Final  88/70/7976 83.3 13.0 - 17.7 g/dL Final         Passed - Cr in normal range and within 360 days    Creat  Date Value Ref Range Status  12/01/2023 0.99 0.70 - 1.30 mg/dL Final         Passed - HCT in normal range and within 360 days    HCT  Date Value Ref Range Status  12/01/2023 44.1 38.5 - 50.0 % Final   Hematocrit  Date Value Ref Range Status  06/23/2022 49.8 37.5 - 51.0 % Final         Passed - AST in normal range and within 360 days    AST  Date Value Ref Range Status  12/01/2023 20 10 - 35 U/L Final         Passed - ALT in normal range and within 360 days    ALT  Date Value Ref Range Status  12/01/2023 26 9 - 46 U/L Final         Passed - Patient is not pregnant      Passed - Valid encounter within last 12 months    Recent Outpatient Visits           3 months ago Chronic right shoulder pain   [place] [facility] - A Dept Of [place]. [facility] Vicci Barnie NOVAK, MD   5 months ago Primary hypertension   [place] [facility] [place] - A Dept Of [place]. [facility] Brien Belvie BRAVO, MD   1 year ago Severe episode of recurrent major depressive disorder, without psychotic features [facility])   [place] [facility] - A Dept Of [place]. [facility] Brien Belvie BRAVO, MD   1 year ago Primary hypertension   [place] [facility] [place] - A Dept Of [place]. [facility] Brien Belvie BRAVO, MD   1 year ago Primary hypertension   [place] [facility] - A Dept  Of [place] Colon. [facility] Brien Belvie BRAVO, MD       Future Appointments             In 2 weeks Vicci Barnie NOVAK, MD [facility] Shelly - A Dept Of Jolynn DEL. [facility], [place]   In 4 weeks Vu, Constance DASEN, MD [facility] C[facility] Infect Dis - A Dept Of [place]. [facility], RCID            Refused Prescriptions Disp Refills   hydrochlorothiazide   (HYDRODIURIL ) 12.5 MG tablet 90 tablet 1    Sig: Take 1 tablet (12.5 mg total) by mouth daily.     Cardiovascular: Diuretics - Thiazide Failed - 06/14/2024 10:05 AM      Failed - Cr in normal range and within 180 days    Creat  Date Value Ref Range Status  12/01/2023 0.99 0.70 - 1.30 mg/dL Final         Failed - K in normal range and within 180 days    Potassium  Date Value Ref Range Status  12/01/2023 3.8 3.5 - 5.3 mmol/L Final         Failed - Na in normal range and within 180 days    Sodium  Date Value Ref Range Status  12/01/2023 141 135 - 146 mmol/L Final  06/23/2022 140 134 - 144 mmol/L Final         Passed - Last BP in normal range    BP Readings from Last 1 Encounters:  02/24/24 129/72         Passed - Valid encounter within last 6 months    Recent Outpatient Visits           3 months ago Chronic right shoulder pain   [place] [facility] [place] - A Dept Of [place]. [facility] Vicci Barnie NOVAK, MD   5 months ago Primary hypertension   [place] [facility] [place] - A Dept Of [place]. [facility] Brien Belvie BRAVO, MD   1 year ago Severe episode of recurrent major depressive disorder, without psychotic features [facility])   [place] [facility] Well[facility] - A Dept Of [place]. [facility] Brien Belvie BRAVO, MD   1 year ago Primary hypertension   [place] [facility] [place] - A Dept Of [place]. [facility] Brien Belvie BRAVO, MD   1 year ago Primary hypertension   [place] [facility] [place] [place] - A Dept Of [place]. [facility] Brien Belvie BRAVO, MD       Future Appointments             In 2 weeks Vicci Barnie NOVAK, MD [facility] Shelly - A Dept Of Jolynn DEL. [facility], [place]   In 4 weeks Vu, Constance DASEN, MD [facility] Health [facility] Ctr Infect Dis - A Dept Of [place]. [facility], RCID             metoprolol  succinate (TOPROL -XL) 25 MG 24 hr tablet 90 tablet 2     Sig: Take 1 tablet (25 mg total) by mouth daily.     Cardiovascular:  Beta Blockers Passed - 06/14/2024 10:05 AM      Passed - Last BP in normal range    BP Readings from Last 1 Encounters:  02/24/24 129/72         Passed - Last Heart  Rate in normal range    Pulse Readings from Last 1 Encounters:  02/24/24 77         Passed - Valid encounter within last 6 months    Recent Outpatient Visits           3 months ago Chronic right shoulder pain   [place] [facility] [place] - A Dept Of [place]. [facility] Vicci Barnie NOVAK, MD   5 months ago Primary hypertension   [place] [facility] [place] - A Dept Of [place]. [facility] Brien Belvie BRAVO, MD   1 year ago Severe episode of recurrent major depressive disorder, without psychotic features [facility])   [place] [facility] Wellnss - A Dept Of [place]. [facility] Brien Belvie BRAVO, MD   1 year ago Primary hypertension   [place] [facility] [place] - A Dept Of [place]. [facility] Brien Belvie BRAVO, MD   1 year ago Primary hypertension   [place] [facility] [place] - A Dept Of [place]. [facility] Brien Belvie BRAVO, MD       Future Appointments             In 2 weeks Vicci Barnie NOVAK, MD [facility] Shelly - A Dept Of Jolynn DEL. [facility], [place]   In 4 weeks Vu, Constance DASEN, MD [facility] Health Reg Ctr Infect Dis - A Dept Of [place]. [facility], RCID             omeprazole  (PRILOSEC) 20 MG capsule 90 capsule 1    Sig: Take 1 capsule (20 mg total) by mouth daily.     Gastroenterology: Proton Pump Inhibitors Passed - 06/14/2024 10:05 AM      Passed - Valid encounter within last 12 months    Recent Outpatient Visits           3 months ago Chronic right shoulder pain   [place] [facility] [place] - A Dept Of [place]. [facility] Vicci Barnie NOVAK, MD   5 months ago Primary hypertension   [place]  [facility] [place] - A Dept Of [place]. [facility] Brien Belvie BRAVO, MD   1 year ago Severe episode of recurrent major depressive disorder, without psychotic features [facility])   [place] [facility] Well[facility] - A Dept Of [place]. [facility] Brien Belvie BRAVO, MD   1 year ago Primary hypertension   [place] [facility] [place] - A Dept Of [place]. [facility] Brien Belvie BRAVO, MD   1 year ago Primary hypertension   [place] [facility] [place] - A Dept Of [place]. [facility] Brien Belvie BRAVO, MD       Future Appointments             In 2 weeks Vicci Barnie NOVAK, MD [facility] Shelly - A Dept Of Jolynn DEL. [facility], [place]   In 4 weeks Vu, Constance DASEN, MD [facility] Reg [facility]tr Infect Dis - A Dept Of [place]. [facility], RC[facility]             cetirizine  (ZYRTEC  ALLERGY) 10 MG tablet 90 tablet 1    Sig: Take 1 tablet (10 mg total) by mouth daily.     Ear, Nose, and Throat:  Antihistamines 2 Passed - 06/14/2024 10:05 AM  Passed - Cr in normal range and within 360 days    Creat  Date Value Ref Range Status  12/01/2023 0.99 0.70 - 1.30 mg/dL Final         Passed - Valid encounter within last 12 months    Recent Outpatient Visits           3 months ago Chronic right shoulder pain   [place] [facility] - A Dept Of [place]. [facility] Vicci Barnie NOVAK, MD   5 months ago Primary hypertension   [place] [facility] [place] - A Dept Of [place]. [facility] Brien Belvie BRAVO, MD   1 year ago Severe episode of recurrent major depressive disorder, without psychotic features [facility])   [place] [facility] Wellnss - A Dept Of [place]. [facility] Brien Belvie BRAVO, MD   1 year ago Primary hypertension   [place] [facility] [place] - A Dept Of [place]. [facility] Brien Belvie BRAVO, MD   1 year ago Primary hypertension     [facility] [place] - A Dept Of [place]. [facility] Brien Belvie BRAVO, MD       Future Appointments             In 2 weeks Vicci Barnie NOVAK, MD [facility] Shelly - A Dept Of Jolynn DEL. [facility], [place]   In 4 weeks Vu, Constance DASEN, MD [facility] Health Reg Ctr Infect Dis - A Dept Of [place]. [facility], [place]

## 2024-06-14 NOTE — Telephone Encounter (Signed)
 Requested medications are due for refill today.  unsure  Requested medications are on the active medications list.  yes  Last refill. 03/15/2024 #90 1 rf  Future visit scheduled.   yes  Notes to clinic.  Rx was written to expire 04/14/2024 - rx is expired.    Requested Prescriptions  Pending Prescriptions Disp Refills   amLODipine  (NORVASC ) 10 MG tablet 90 tablet 1    Sig: Take 1 tablet (10 mg total) by mouth daily.     Cardiovascular: Calcium Channel Blockers 2 Passed - 06/14/2024 10:06 AM      Passed - Last BP in normal range    BP Readings from Last 1 Encounters:  02/24/24 129/72         Passed - Last Heart Rate in normal range    Pulse Readings from Last 1 Encounters:  02/24/24 77         Passed - Valid encounter within last 6 months    Recent Outpatient Visits           3 months ago Chronic right shoulder pain   [facility] - A Dept Of [place]. [facility] Vicci Barnie NOVAK, MD   5 months ago Primary hypertension   [facility] - A Dept Of [place]. [facility] Brien Belvie BRAVO, MD   1 year ago Severe episode of recurrent major depressive disorder, without psychotic features [facility])   [facility] - A Dept Of [place]. [facility] Brien Belvie BRAVO, MD   1 year ago Primary hypertension   [facility] - A Dept Of [place]. [facility] Brien Belvie BRAVO, MD   1 year ago Primary hypertension   [facility] - A Dept Of [place]. [facility] Brien Belvie BRAVO, MD       Future Appointments             In 2 weeks Vicci Barnie NOVAK, MD [facility] - A Dept Of [place]. [facility] [place]   In 4 weeks Vu, Constance DASEN, MD [facility] Infect Dis - A Dept Of [place]. [facility], [facility]            Signed Prescriptions Disp Refills   famotidine  (PEPCID ) 40 MG tablet 60  tablet 2    Sig: Take 1 tablet (40 mg total) by mouth 2 (two) times daily.     Gastroenterology:  H2 Antagonists Passed - 06/14/2024 10:06 AM      Passed - Valid encounter within last 12 months    Recent Outpatient Visits           3 months ago Chronic right shoulder pain   [facility] - A Dept Of [place]. [facility] Vicci Barnie NOVAK, MD   5 months ago Primary hypertension   [facility] - A Dept Of [place]. [facility] Brien Belvie BRAVO, MD   1 year ago Severe episode of recurrent major depressive disorder, without psychotic features [facility])   [facility] - A Dept Of [place]. [facility] Brien Belvie BRAVO, MD   1 year ago Primary hypertension   [place] [place] - A Dept Of . [facility] Brien Belvie BRAVO, MD   1 year ago Primary hypertension  [facility] [facility] - A Dept Of [place]. [facility] Brien Belvie BRAVO, MD       Future Appointments             In 2 weeks Vicci Barnie NOVAK, MD [facility] - A Dept Of [place]. [facility] [place]   In 4 weeks Vu, Constance DASEN, MD [facility] Infect Dis - A Dept Of [place]. [facility], [facility]             meloxicam  (MOBIC ) 15 MG tablet 30 tablet 2    Sig: Take 1 tablet (15 mg total) by mouth daily.     Analgesics:  COX2 Inhibitors Failed - 06/14/2024 10:06 AM      Failed - Manual Review: Labs are only required if the patient has taken medication for more than 8 weeks.      Failed - eGFR is 30 or above and within 360 days    GFR, Est African American  Date Value Ref Range Status  09/25/2020 118 > OR = 60 mL/min/1.63m2 Final   GFR, Est Non African American  Date Value Ref Range Status  09/25/2020 101 > OR = 60 mL/min/1.59m2 Final   GFR, Estimated  Date Value Ref Range Status  08/17/2021 >60 >60 mL/min Final    Comment:     (NOTE) Calculated using the CKD-EPI Creatinine Equation (2021)    eGFR  Date Value Ref Range Status  11/04/2022 88 > OR = 60 mL/min/1.91m2 Final  06/23/2022 89 >59 mL/min/1.73 Final         Passed - HGB in normal range and within 360 days    Hemoglobin  Date Value Ref Range Status  12/01/2023 14.5 13.2 - 17.1 g/dL Final  88/70/7976 83.3 13.0 - 17.7 g/dL Final         Passed - Cr in normal range and within 360 days    Creat  Date Value Ref Range Status  12/01/2023 0.99 0.70 - 1.30 mg/dL Final         Passed - HCT in normal range and within 360 days    HCT  Date Value Ref Range Status  12/01/2023 44.1 38.5 - 50.0 % Final   Hematocrit  Date Value Ref Range Status  06/23/2022 49.8 37.5 - 51.0 % Final         Passed - AST in normal range and within 360 days    AST  Date Value Ref Range Status  12/01/2023 20 10 - 35 U/L Final         Passed - ALT in normal range and within 360 days    ALT  Date Value Ref Range Status  12/01/2023 26 9 - 46 U/L Final         Passed - Patient is not pregnant      Passed - Valid encounter within last 12 months    Recent Outpatient Visits           3 months ago Chronic right shoulder pain   [place] [facility] - A Dept Of [place]. [facility] Vicci Barnie NOVAK, MD   5 months ago Primary hypertension   [place] [place] [facility] [place] - A Dept Of [place]. [facility] Brien Belvie BRAVO, MD   1 year ago Severe episode of recurrent major depressive disorder, without psychotic features [facility])   [facility] [facility] - A  Dept Of [place]. [facility] Brien Belvie BRAVO, MD   1 year ago Primary hypertension   [place] [facility] - A Dept Of [place]. [facility] Brien Belvie BRAVO, MD   1 year ago Primary hypertension   [place] [facility] [place] - A Dept Of [place]. [facility] Brien Belvie BRAVO, MD       Future Appointments             In 2  weeks Vicci Barnie NOVAK, MD [facility] - A Dept Of [place]. [facility], [place]   In 4 weeks Vu, Constance DASEN, MD [facility] Infect Dis - A Dept Of [place]. [facility], [facility]            Refused Prescriptions Disp Refills   hydrochlorothiazide  (HYDRODIURIL ) 12.5 MG tablet 90 tablet 1    Sig: Take 1 tablet (12.5 mg total) by mouth daily.     Cardiovascular: Diuretics - Thiazide Failed - 06/14/2024 10:06 AM      Failed - Cr in normal range and within 180 days    Creat  Date Value Ref Range Status  12/01/2023 0.99 0.70 - 1.30 mg/dL Final         Failed - K in normal range and within 180 days    Potassium  Date Value Ref Range Status  12/01/2023 3.8 3.5 - 5.3 mmol/L Final         Failed - Na in normal range and within 180 days    Sodium  Date Value Ref Range Status  12/01/2023 141 135 - 146 mmol/L Final  06/23/2022 140 134 - 144 mmol/L Final         Passed - Last BP in normal range    BP Readings from Last 1 Encounters:  02/24/24 129/72         Passed - Valid encounter within last 6 months    Recent Outpatient Visits           3 months ago Chronic right shoulder pain   [place] [facility] - A Dept Of [place]. [facility] Vicci Barnie NOVAK, MD   5 months ago Primary hypertension   [place] [facility] [place] - A Dept Of [place]. [facility] Brien Belvie BRAVO, MD   1 year ago Severe episode of recurrent major depressive disorder, without psychotic features [facility])   [place] [facility] - A Dept Of [place]. [facility] Brien Belvie BRAVO, MD   1 year ago Primary hypertension   [place] [facility] [place] - A Dept Of [place]. [facility] Brien Belvie BRAVO, MD   1 year ago Primary hypertension   [place] [facility] [place] - A Dept Of [place]. [facility] Brien Belvie BRAVO, MD       Future Appointments             In 2 weeks  Vicci Barnie NOVAK, MD [facility] - A Dept Of [place]. [facility], [place]   In 4 weeks Vu, Constance DASEN, MD [facility] Infect Dis - A Dept Of [place]. [facility]             metoprolol  succinate (TOPROL -XL) 25 MG 24 hr tablet 90 tablet 2    Sig: Take 1 tablet (  25 mg total) by mouth daily.     Cardiovascular:  Beta Blockers Passed - 06/14/2024 10:06 AM      Passed - Last BP in normal range    BP Readings from Last 1 Encounters:  02/24/24 129/72         Passed - Last Heart Rate in normal range    Pulse Readings from Last 1 Encounters:  02/24/24 77         Passed - Valid encounter within last 6 months    Recent Outpatient Visits           3 months ago Chronic right shoulder pain   [facility] [place] - A Dept Of [place]. [facility] Vicci Barnie NOVAK, MD   5 months ago Primary hypertension   [facility] - A Dept Of [place]. [facility] Brien Belvie BRAVO, MD   1 year ago Severe episode of recurrent major depressive disorder, without psychotic features [facility])   [facility] [facility] - A Dept Of [place]. [facility] Brien Belvie BRAVO, MD   1 year ago Primary hypertension   [facility] [place] - A Dept Of [place]. [facility] Brien Belvie BRAVO, MD   1 year ago Primary hypertension   [facility] - A Dept Of [place]. [facility] Brien Belvie BRAVO, MD       Future Appointments             In 2 weeks Vicci Barnie NOVAK, MD [facility] Shel[facility] - A Dept Of [place]. [facility] [place]   In 4 weeks Vu, Constance DASEN, MD [facility] Infect Dis - A Dept Of [place]. [facility], RC[facility]             omeprazole  (PRILOSEC) 20 MG capsule 90 capsule 1    Sig: Take 1 capsule (20 mg total) by mouth daily.     Gastroenterology: Proton Pump Inhibitors Passed -  06/14/2024 10:06 AM      Passed - Valid encounter within last 12 months    Recent Outpatient Visits           3 months ago Chronic right shoulder pain   [facility] [place] - A Dept Of [place]. [facility] Vicci Barnie NOVAK, MD   5 months ago Primary hypertension   [place] [facility] [place] - A Dept Of [place]. [facility] Brien Belvie BRAVO, MD   1 year ago Severe episode of recurrent major depressive disorder, without psychotic features [facility])   [place] [facility] - A Dept Of [place]. [facility] Brien Belvie BRAVO, MD   1 year ago Primary hypertension   [place] [facility] [place] - A Dept Of [place]. [facility] Brien Belvie BRAVO, MD   1 year ago Primary hypertension   [place] [facility] - A Dept Of [place]. [facility] Brien Belvie BRAVO, MD       Future Appointments             In 2 weeks Vicci Barnie NOVAK, MD [facility] - A Dept Of [place]. [facility], [place]   In 4 weeks Vu, Constance DASEN, MD [facility]tr Infect Dis - A Dept Of [place]. [facility], [facility]             cetirizine  (ZYRTEC  ALLERGY) 10 MG tablet 90 tablet 1    Sig: Take 1 tablet (10 mg total) by mouth daily.     Ear, Nose, and Throat:  Antihistamines 2 Passed - 06/14/2024 10:06 AM      Passed - Cr in normal range and within 360 days    Creat  Date Value Ref Range Status  12/01/2023 0.99 0.70 - 1.30 mg/dL Final         Passed - Valid encounter within last 12 months    Recent Outpatient Visits           3 months ago Chronic right shoulder pain   [place] [facility] - A Dept Of [place]. [facility] Vicci Barnie NOVAK, MD   5 months ago Primary hypertension   [place] [facility] [place] - A Dept Of [place]. [facility] Brien Belvie BRAVO, MD   1 year ago Severe episode of recurrent major depressive disorder, without  psychotic features [facility])   [place] [facility] - A Dept Of [place]. [facility] Brien Belvie BRAVO, MD   1 year ago Primary hypertension   [place] [facility] - A Dept Of [place]. [facility] Brien Belvie BRAVO, MD   1 year ago Primary hypertension   [place] [facility] [place] - A Dept Of [place]. [facility] Brien Belvie BRAVO, MD       Future Appointments             In 2 weeks Vicci Barnie NOVAK, MD [facility] - A Dept Of [place]. [facility] [place]   In 4 weeks Vu, Constance DASEN, MD [facility] Infect Dis - A Dept Of [place]. [facility], RCID             hw

## 2024-06-14 NOTE — Telephone Encounter (Signed)
 Requested medication (s) are due for refill today: yes  Requested medication (s) are on the active medication list: yes  Last refill:  03/21/24  Future visit scheduled: yes  Notes to clinic:  Medication not assigned to a protocol, review manually.      Requested Prescriptions  Pending Prescriptions Disp Refills   testosterone  cypionate (DEPOTESTOTERONE CYPIONATE) 100 MG/ML injection 12 mL 1    Sig: Inject 1 mL (100 mg total) into the muscle every 7 (seven) days. For IM use only     Off-Protocol Failed - 06/14/2024 10:38 AM      Failed - Medication not assigned to a protocol, review manually.      Passed - Valid encounter within last 12 months    Recent Outpatient Visits           3 months ago Chronic right shoulder pain   [place] [facility] - A Dept Of [place]. [facility] Vicci Barnie NOVAK, MD   5 months ago Primary hypertension   [place] [facility] [place] - A Dept Of [place]. [facility] Brien Belvie BRAVO, MD   1 year ago Severe episode of recurrent major depressive disorder, without psychotic features [facility])   [place] [facility] - A Dept Of [place]. [facility] Brien Belvie BRAVO, MD   1 year ago Primary hypertension   [place] [facility] [place] - A Dept Of [place]. [facility] Brien Belvie BRAVO, MD   1 year ago Primary hypertension   [place] [facility] [place] - A Dept Of [place]. [facility] Brien Belvie BRAVO, MD       Future Appointments             In 2 weeks Vicci Barnie NOVAK, MD [facility] - A Dept Of Jolynn DEL. [facility], [place]   In 4 weeks Vu, Constance DASEN, MD [facility] Infect Dis - A Dept Of [place]. [facility], [place]

## 2024-06-26 ENCOUNTER — Other Ambulatory Visit: Payer: Self-pay | Admitting: Critical Care Medicine

## 2024-06-26 DIAGNOSIS — I10 Essential (primary) hypertension: Secondary | ICD-10-CM

## 2024-06-26 NOTE — Telephone Encounter (Signed)
 Copied from CRM [id number]. Topic: Clinical - Medication Refill >> Jun 26, 2024  2:24 PM Fonda T wrote: Medication: hydrochlorothiazide  (HYDRODIURIL ) 12.5 MG tablet  Has the patient contacted their pharmacy? Yes, advised to contact office   This is the patient's preferred pharmacy:   [facility] [place], [place] - [address] [place] [address] Phone: [phone] Fax: [phone]  Is this the correct pharmacy for this prescription? Yes If no, delete pharmacy and type the correct one.   Has the prescription been filled recently? Yes  Is the patient out of the medication? Yes  Has the patient been seen for an appointment in the last year OR does the patient have an upcoming appointment? Yes  Can we respond through MyChart? Yes  Agent: Please be advised that Rx refills may take up to 3 business days. We ask that you follow-up with your pharmacy.

## 2024-06-27 ENCOUNTER — Encounter: Payer: MEDICAID | Admitting: Gastroenterology

## 2024-06-29 ENCOUNTER — Encounter: Payer: Self-pay | Admitting: Internal Medicine

## 2024-06-29 ENCOUNTER — Telehealth: Payer: Self-pay | Admitting: Internal Medicine

## 2024-06-29 ENCOUNTER — Ambulatory Visit: Payer: MEDICAID | Attending: Internal Medicine | Admitting: Internal Medicine

## 2024-06-29 VITALS — BP 128/72 | HR 72 | Ht 70.0 in | Wt 211.2 lb

## 2024-06-29 DIAGNOSIS — F1911 Other psychoactive substance abuse, in remission: Secondary | ICD-10-CM

## 2024-06-29 DIAGNOSIS — Z87891 Personal history of nicotine dependence: Secondary | ICD-10-CM

## 2024-06-29 DIAGNOSIS — K219 Gastro-esophageal reflux disease without esophagitis: Secondary | ICD-10-CM

## 2024-06-29 DIAGNOSIS — Z1211 Encounter for screening for malignant neoplasm of colon: Secondary | ICD-10-CM

## 2024-06-29 DIAGNOSIS — I10 Essential (primary) hypertension: Secondary | ICD-10-CM | POA: Diagnosis not present

## 2024-06-29 DIAGNOSIS — E291 Testicular hypofunction: Secondary | ICD-10-CM

## 2024-06-29 DIAGNOSIS — Z21 Asymptomatic human immunodeficiency virus [HIV] infection status: Secondary | ICD-10-CM

## 2024-06-29 DIAGNOSIS — F411 Generalized anxiety disorder: Secondary | ICD-10-CM

## 2024-06-29 DIAGNOSIS — J302 Other seasonal allergic rhinitis: Secondary | ICD-10-CM

## 2024-06-29 DIAGNOSIS — Z23 Encounter for immunization: Secondary | ICD-10-CM | POA: Diagnosis not present

## 2024-06-29 DIAGNOSIS — F330 Major depressive disorder, recurrent, mild: Secondary | ICD-10-CM

## 2024-06-29 DIAGNOSIS — Z950 Presence of cardiac pacemaker: Secondary | ICD-10-CM

## 2024-06-29 DIAGNOSIS — Z5320 Procedure and treatment not carried out because of patient's decision for unspecified reasons: Secondary | ICD-10-CM

## 2024-06-29 MED ORDER — CETIRIZINE HCL 10 MG PO TABS: 10.0000 mg | ORAL_TABLET | Freq: Every day | ORAL | 1 refills | Status: AC

## 2024-06-29 MED ORDER — HYDROCHLOROTHIAZIDE 12.5 MG PO TABS: 12.5000 mg | ORAL_TABLET | Freq: Every day | ORAL | 1 refills | Status: AC

## 2024-06-29 MED ORDER — OMEPRAZOLE 20 MG PO CPDR: 20.0000 mg | DELAYED_RELEASE_CAPSULE | Freq: Every day | ORAL | 1 refills | Status: AC

## 2024-06-29 NOTE — Patient Instructions (Signed)
  VISIT SUMMARY: You came in today for a follow-up on your chronic medical conditions, including hypertension, HIV, and a history of substance use disorder. We discussed your current medications, recent health status, and any necessary adjustments to your treatment plan.  YOUR PLAN: -PRIMARY HYPERTENSION: Hypertension, or high blood pressure, is generally well-controlled with your current medications, though you have occasional high readings. Continue taking amlodipine  10 mg daily, hydrochlorothiazide  12.5 mg daily, and metoprolol  25 mg daily. Keep limiting your salt intake as much as possible.  -SUBSTANCE ABUSE IN REMISSION: Your substance abuse is in remission, meaning you have not used substances for 11 months. Continue with [facility] treatment program [facility].  -PRESENCE OF CARDIAC PACEMAKER: You have a pacemaker due to a slow heart rate (bradycardia), and it is functioning well. Continue with your regular pacemaker checks every six months.  -HUMAN IMMUNODEFICIENCY VIRUS INFECTION: Your HIV is well-managed with an undetectable viral load. Continue taking Biktarvy  as prescribed.  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 We checked your testosterone  levels today and referred you to a specialist for further management. We also ordered tests for PSA, cholesterol, and blood cell count.  -SEASONAL ALLERGIC RHINITIS: Seasonal allergic rhinitis is an allergy to pollen or other seasonal allergens. Continue taking Zyrtec  as needed; your prescription has been refilled.  -GENERAL HEALTH MAINTENANCE: You received a flu shot today. We also discussed the importance of colon cancer screening and planned for a bowel scope.  INSTRUCTIONS: Please follow up with the urologist or endocrinologist as referred for your testosterone  management. Continue with your regular pacemaker checks every six months. Proceed with the bowel scope for colon  cancer screening as planned.                      Contains text generated by Abridge.                                 Contains text generated by Abridge.

## 2024-06-29 NOTE — Progress Notes (Addendum)
 Patient ID: Paul Blake, male    DOB: 1965-09-21  MRN: [id number]  CC: Establish Care (EST care - HTN. /No questions / concerns/Flu vax administered on 06/29/24 - C.A.)   Subjective: Paul Blake is a 58 y.o. male who presents for chronic ds management and officially est with me as PCP since Dr. Brien has retired. I saw him for an acute visit 02/2024. His concerns today include:  Patient with history of HTN, HL, GERD, hypogonadism, HIV, former tob dep/quit 2024, substance use (ETOH and cocaine), Anx/MDD, pace maker/bradycardia (followed by [facility])   Discussed the use of AI scribe software for clinical note transcription with the patient, who gave verbal consent to proceed.  History of Present Illness Paul Blake is a 58 year old male with hypertension, HIV, and a history of substance use disorder who presents for follow-up of his chronic medical conditions.  Polysub Use/Tob: He still resides at [facility], a treatment facility, and has been clean from alcohol and cocaine for eleven months. He quit smoking a year ago after a 20-year history of smoking two packs per day.  HTN: He has a history of hypertension and is currently taking amlodipine  10 mg daily, hydrochlorothiazide  12.5 mg daily, and metoprolol  XL 25 mg daily. He monitors his blood pressure daily at the facility, with readings sometimes reaching as high as 152 mmHg. He attempts to limit his salt intake despite dietary restrictions at the facility.  He has a pacemaker, which was implanted two years ago due to bradycardia. The pacemaker is checked every six months through cardiology at [facility]. Last saw them 03/2024.  HIV: He is HIV positive and takes Biktarvy  consistently. His last viral load check in May showed undetectable levels. Followed by Dr. Overton.  He is being treated for low testosterone  with testosterone  injections of 100 mg weekly. It has been some time since his testosterone  levels were last checked.  He is on  bupropion , Prozac , and Seroquel  for depression and anxiety, with no recent changes in dosage and is doing well on these medications. Sees BH specialist at [facility] who prescribes meds.  Needing refill on omeprazole  and Zyrtec .    Patient Active Problem List   Diagnosis Date Noted   Mixed hyperlipidemia 12/30/2023   Chronic right shoulder pain 11/16/2023   Cocaine abuse (HCC) 10/12/2023   Alcohol abuse 10/12/2023   Gastroesophageal reflux disease without esophagitis 10/12/2023   Administrative encounter 01/20/2023   Major depression 01/19/2023   Carpal tunnel syndrome of right wrist 10/19/2022   Dental caries 10/19/2022   Male hypogonadism 06/23/2022   Tobacco use 07/06/2021   Chronic right-sided low back pain without sciatica 04/06/2021   Insomnia 04/06/2021   Anxiety 04/06/2021   HIV (human immunodeficiency virus infection) (HCC) 10/21/2020   Positive PPD, 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 30 tablet 1   bictegravir-emtricitabine-tenofovir AF (BIKTARVY ) 50-200-25 MG TABS tablet Take 1 tablet by mouth daily. 30 tablet 11   bisacodyl  5 MG EC tablet Take 1 tablet (5 mg total) by mouth as directed. Take 4 tablets at 3:00 pm the day before colonoscopy 4 tablet 0   Blood Pressure Monitoring (BLOOD PRESSURE KIT) DEVI Use to measure blood pressure 1 each 0   buPROPion  (WELLBUTRIN  XL) 150 MG 24 hr tablet Take 1 tablet (150 mg  total) by mouth daily. 90 tablet 1   famotidine  (PEPCID ) 40 MG tablet Take 1 tablet (40 mg total) by mouth 2 (two) times daily. 60 tablet 2   FLUoxetine  (PROZAC ) 20 MG tablet Take 1 tablet (20 mg total) by mouth daily. 90 tablet 1   hydrOXYzine  (ATARAX ) 10 MG tablet Take 1 tablet (10 mg total) by mouth 3 (three) times daily as needed. 90 tablet 1   lidocaine  4 % Place 1 patch onto the  skin daily as needed. 60 patch 1   meloxicam  (MOBIC ) 15 MG tablet Take 1 tablet (15 mg total) by mouth daily. 30 tablet 2   metoprolol  succinate (TOPROL -XL) 25 MG 24 hr tablet Take 1 tablet (25 mg total) by mouth daily. 90 tablet 2   QUEtiapine  (SEROQUEL ) 100 MG tablet Take 1 tablet (100 mg total) by mouth at bedtime. 90 tablet 1   SYRINGE-NEEDLE, DISP, 3 ML (BD PLASTIPAK SYRINGE) 21G X 1 3 ML MISC Use to inject 1 ml of testosterone  IM every 7 days 50 each 0   testosterone  cypionate (DEPOTESTOTERONE CYPIONATE) 100 MG/ML injection Inject 1 mL (100 mg total) into the muscle every 7 (seven) days. For IM use only 12 mL 1   traZODone  (DESYREL ) 150 MG tablet Take 150 mg by mouth at bedtime.     traZODone  (DESYREL ) 50 MG tablet Take 1 tablet (50 mg total) by mouth at bedtime as needed for sleep. 90 tablet 0   No current facility-administered medications on file prior to visit.    No Known Allergies  Social History   Socioeconomic History   Marital status: Married    Spouse name: Not on file   Number of children: Not on file   Years of education: Not on file   Highest education level: Not on file  Occupational History   Not on file  Tobacco Use   Smoking status: Former    Current packs/day: 0.50    Types: Cigars, Cigarettes    Passive exposure: Never   Smokeless tobacco: Former   Tobacco comments:    Smokes 1 cigar/day.    Quit smoking cigarettes    Vaping sometimes as of 12/01/23  Vaping Use   Vaping status: Some Days   Substances: Nicotine, Flavoring  Substance and Sexual Activity   Alcohol use: Not Currently    Comment: Last date of use 01.21.2025   Drug use: Not Currently    Types: Crack cocaine    Comment: last  date of use 01.21.2025   Sexual activity: Not Currently    Comment: declined condoms  Other Topics Concern   Not on file  Social History Narrative   Not on file   Social Drivers of Health   Financial Resource Strain: Low Risk  (04/05/2024)   Received from  [facility]   Overall Financial Resource Strain (CARDIA)    How hard is it for you to pay for the very basics like food, housing, medical care, and heating?: Not hard at all  Food Insecurity: No Food Insecurity (04/05/2024)   Received from [facility]   Hunger Vital Sign    Within the past 12 months, you worried that your food would run out before you got the money to buy more.: Never true    Within the past 12 months, the food you bought just didn't last and you didn't have money to get more.: Never true  Transportation Needs: No Transportation Needs (04/05/2024)   Received from [facility] -  Transportation    In the past 12 months, has lack of transportation kept you from medical appointments or from getting medications?: No    In the past 12 months, has lack of transportation kept you from meetings, work, or from getting things needed for daily living?: No  Physical Activity: Not on file  Stress: Not on file (06/01/2023)  Social Connections: Not on file  Intimate Partner Violence: Not on file    Family History  Problem Relation Age of Onset   Colon cancer Neg Hx    Rectal cancer Neg Hx    Stomach cancer Neg Hx    Esophageal cancer Neg Hx     Past Surgical History:  Procedure Laterality Date   CARDIAC PACEMAKER PLACEMENT Left 12/2021   no past surgery       ROS: Review of Systems Negative except as stated above  PHYSICAL EXAM: BP 128/72 (BP Location: Left Arm, Patient Position: Sitting, Cuff Size: Large)   Pulse 72   Ht 5' 10 (1.778 m)   Wt 211 lb 3.2 oz (95.8 kg)   SpO2 94%   BMI 30.30 kg/m   Physical Exam General appearance - alert, well appearing, older AAM and in no distress Mental status - normal mood, behavior, speech, dress, motor activity, and thought processes Chest - clear to auscultation, no wheezes, rales or rhonchi, symmetric air entry Heart - normal rate, regular rhythm, normal S1, S2, no murmurs, rubs, clicks or gallops Extremities -  peripheral pulses normal, no pedal edema, no clubbing or cyanosis     06/29/2024   10:20 AM 02/24/2024    2:46 PM 12/29/2023   10:43 AM  Depression screen PHQ 2/9  Decreased Interest 0 1 1  Down, Depressed, Hopeless 0 1 3  PHQ - 2 Score 0 2 4  Altered sleeping 0 2 1  Tired, decreased energy 0 1 2  Change in appetite 0 0 0  Feeling bad or failure about yourself  0 0 1  Trouble concentrating 0 0 0  Moving slowly or fidgety/restless 0 0 0  Suicidal thoughts 0 0 0  PHQ-9 Score 0 5  8   Difficult doing work/chores Not difficult at all Not difficult at all Not difficult at all     Data saved with a previous flowsheet row definition       Latest Ref Rng & Units 12/01/2023   11:34 AM 11/04/2022   11:07 AM 06/23/2022   10:19 AM  CMP  Glucose 65 - 99 mg/dL 80  79  84   BUN 7 - 25 mg/dL 13  18  15    Creatinine 0.70 - 1.30 mg/dL 9.00  8.98  8.99   Sodium 135 - 146 mmol/L 141  141  140   Potassium 3.5 - 5.3 mmol/L 3.8  3.6  4.2   Chloride 98 - 110 mmol/L 104  105  101   CO2 20 - 32 mmol/L 30  27  23    Calcium 8.6 - 10.3 mg/dL 89.8  89.7  89.8   Total Protein 6.1 - 8.1 g/dL 7.7  7.1  7.7   Total Bilirubin 0.2 - 1.2 mg/dL 0.4  0.4  0.5   Alkaline Phos 44 - 121 IU/L   117   AST 10 - 35 U/L 20  20  23    ALT 9 - 46 U/L 26  17  19     Lipid Panel     Component Value Date/Time   CHOL 194 12/29/2023 1118  TRIG 261 (H) 12/29/2023 1118   HDL 42 12/29/2023 1118   CHOLHDL 4.6 12/29/2023 1118   CHOLHDL 3.8 09/25/2020 0950   LDLCALC 107 (H) 12/29/2023 1118   LDLCALC 129 (H) 09/25/2020 0950    CBC    Component Value Date/Time   WBC 5.6 12/01/2023 1134   RBC 5.18 12/01/2023 1134   HGB 14.5 12/01/2023 1134   HGB 16.6 06/23/2022 1019   HCT 44.1 12/01/2023 1134   HCT 49.8 06/23/2022 1019   PLT 214 12/01/2023 1134   PLT 170 06/23/2022 1019   MCV 85.1 12/01/2023 1134   MCV 86 06/23/2022 1019   MCH 28.0 12/01/2023 1134   MCHC 32.9 12/01/2023 1134   RDW 14.8 12/01/2023 1134   RDW 13.7  06/23/2022 1019   LYMPHSABS 2.1 06/23/2022 1019   MONOABS 0.6 08/17/2021 1712   EOSABS 0.2 06/23/2022 1019   BASOSABS 0.1 06/23/2022 1019    ASSESSMENT AND PLAN: 1. Primary hypertension (Primary) At goal Continue amlodipine  10 mg daily. - Continue hydrochlorothiazide  12.5 mg daily. - Continue metoprolol  25 mg daily. - hydrochlorothiazide  (HYDRODIURIL ) 12.5 MG tablet; Take 1 tablet (12.5 mg total) by mouth daily.  Dispense: 90 tablet; Refill: 1 - CBC - Lipid panel  2. Substance abuse in remission [facility]) Commended him on this.  He remains in a treatment program with [facility].  3. Asymptomatic HIV infection, with no history of HIV-related illness (HCC) Continue Biktarvy  as prescribed by his infectious disease specialist.  4. Male hypogonadism Patient on testosterone  shots.  His previous PCP was prescribing for him.  Will try to get him in with urology for ongoing evaluation and monitoring.  Advised that cholesterol, CBC, PSA and testosterone  levels need to be monitored. - Ambulatory referral to Urology - PSA - Testosterone ,Free and Total  5. Seasonal allergies Needed RF Zyrtec  - cetirizine  (ZYRTEC  ALLERGY) 10 MG tablet; Take 1 tablet (10 mg total) by mouth daily.  Dispense: 90 tablet; Refill: 1  6. Pacemaker Followed by [facility] cardiology  7. Former smoker Commended him on quitting.  Encouraged him to remain tobacco free.  8. Lung cancer screening declined by patient He qualifies for lung cancer screening and I recommended.  Patient declines  9. Gastroesophageal reflux disease without esophagitis - omeprazole  (PRILOSEC) 20 MG capsule; Take 1 capsule (20 mg total) by mouth daily.  Dispense: 90 capsule; Refill: 1  10. GAD (generalized anxiety disorder) 11. Major depressive disorder, recurrent episode, mild Stable on meds listed above. Plugged in with BH through [facility]  12. Need for immunization against influenza - Flu vaccine trivalent PF, 6mos and  older(Flulaval,Afluria,Fluarix,Fluzone)  HM: has c-scoped scheduled for next wk Addendum 06/30/2024: pt called back stating he needs an updated referral sent to [facility] for the c-scope.  Referral submitted.      Patient was given the opportunity to ask questions.  Patient verbalized understanding of the plan and was able to repeat key elements of the plan.   This documentation was completed using Paediatric nurse.  Any transcriptional errors are unintentional.  Orders Placed This Encounter  Procedures   Flu vaccine trivalent PF, 6mos and older(Flulaval,Afluria,Fluarix,Fluzone)   CBC   Lipid panel   PSA   Testosterone ,Free and Total   Ambulatory referral to Urology     Requested Prescriptions   Signed Prescriptions Disp Refills   cetirizine  (ZYRTEC  ALLERGY) 10 MG tablet 90 tablet 1    Sig: Take 1 tablet (10 mg total) by mouth daily.   hydrochlorothiazide  (HYDRODIURIL ) 12.5 MG  tablet 90 tablet 1    Sig: Take 1 tablet (12.5 mg total) by mouth daily.   omeprazole  (PRILOSEC) 20 MG capsule 90 capsule 1    Sig: Take 1 capsule (20 mg total) by mouth daily.    Return in about 4 months (around 10/28/2024).  Barnie Louder, MD, FACP

## 2024-06-29 NOTE — Telephone Encounter (Unsigned)
 Copied from CRM #[id number]. Topic: Referral - Request for Referral >> Jun 29, 2024  3:17 PM Tiffini S wrote: Did the patient discuss referral with their provider in the last year? Yes (If No - schedule appointment) (If Yes - send message)  Appointment offered? Yes  Type of order/referral and detailed reason for visit: Colonoscopy- patient states that a new referral must be submitted   Preference of office, provider, location:   [facility]   If referral order, have you been seen by this specialty before? No (If Yes, this issue or another issue? When? Where?  Can we respond through MyChart? Yes

## 2024-06-29 NOTE — Telephone Encounter (Signed)
 Duplicate request, refilled 06/29/24.  Requested Prescriptions  Pending Prescriptions Disp Refills   hydrochlorothiazide  (HYDRODIURIL ) 12.5 MG tablet 90 tablet 1    Sig: Take 1 tablet (12.5 mg total) by mouth daily.     Cardiovascular: Diuretics - Thiazide Failed - 06/29/2024 12:21 PM      Failed - Cr in normal range and within 180 days    Creat  Date Value Ref Range Status  12/01/2023 0.99 0.70 - 1.30 mg/dL Final         Failed - K in normal range and within 180 days    Potassium  Date Value Ref Range Status  12/01/2023 3.8 3.5 - 5.3 mmol/L Final         Failed - Na in normal range and within 180 days    Sodium  Date Value Ref Range Status  12/01/2023 141 135 - 146 mmol/L Final  06/23/2022 140 134 - 144 mmol/L Final         Passed - Last BP in normal range    BP Readings from Last 1 Encounters:  06/29/24 128/72         Passed - Valid encounter within last 6 months    Recent Outpatient Visits           Today Primary hypertension   [facility] [place]. [facility] NOVAK, MD   4 months ago Chronic right shoulder pain   [place] [facility] Of [place]. [facility] Barnie NOVAK, MD   6 months ago Primary hypertension   [facility] [place] [facility] Of [place]. [facility] Belvie BRAVO, MD   1 year ago Severe episode of recurrent major depressive disorder, without psychotic features [facility])   [place] [facility] Of [place]. [facility] Brien Belvie BRAVO, MD   1 year ago Primary hypertension    [facility] [place] - [facility] Dept Of [place]. [facility] Brien Belvie BRAVO, MD       Future Appointments             In 1 week Vu, Constance DASEN, MD [facility] - A Dept Of [place]. [facility] [place]

## 2024-06-30 NOTE — Addendum Note (Signed)
 Addended by: VICCI SOBER B on: 06/30/2024 06:43 AM   Modules accepted: Orders

## 2024-06-30 NOTE — Telephone Encounter (Signed)
 Pt is scheduled for colonoscopy 07/02/2024 with [facility]. He needs an updated referral sent.  I have placed the order.

## 2024-07-01 ENCOUNTER — Ambulatory Visit: Payer: Self-pay | Admitting: Internal Medicine

## 2024-07-01 DIAGNOSIS — E291 Testicular hypofunction: Secondary | ICD-10-CM

## 2024-07-01 DIAGNOSIS — D751 Secondary polycythemia: Secondary | ICD-10-CM

## 2024-07-01 LAB — CBC
Hematocrit: 54.6 % — ABNORMAL HIGH (ref 37.5–51.0)
Hemoglobin: 17.5 g/dL (ref 13.0–17.7)
MCH: 26.2 pg — ABNORMAL LOW (ref 26.6–33.0)
MCHC: 32.1 g/dL (ref 31.5–35.7)
MCV: 82 fL (ref 79–97)
Platelets: 179 x10E3/uL (ref 150–450)
RBC: 6.68 x10E6/uL — ABNORMAL HIGH (ref 4.14–5.80)
RDW: 18.6 % — ABNORMAL HIGH (ref 11.6–15.4)
WBC: 5.8 x10E3/uL (ref 3.4–10.8)

## 2024-07-01 LAB — LIPID PANEL
Chol/HDL Ratio: 4.7 ratio (ref 0.0–5.0)
Cholesterol, Total: 178 mg/dL (ref 100–199)
HDL: 38 mg/dL — ABNORMAL LOW (ref >39)
LDL Chol Calc (NIH): 102 mg/dL — ABNORMAL HIGH (ref 0–99)
Triglycerides: 219 mg/dL — ABNORMAL HIGH (ref 0–149)
VLDL Cholesterol Cal: 38 mg/dL (ref 5–40)

## 2024-07-01 LAB — PSA: Prostate Specific Ag, Serum: 0.4 ng/mL (ref 0.0–4.0)

## 2024-07-01 LAB — TESTOSTERONE,FREE AND TOTAL
Testosterone, Free: 40.8 pg/mL — AB (ref 7.2–24.0)
Testosterone: 1433 ng/dL — ABNORMAL HIGH (ref 264–916)

## 2024-07-02 ENCOUNTER — Encounter: Payer: MEDICAID | Admitting: Gastroenterology

## 2024-07-02 ENCOUNTER — Telehealth: Payer: Self-pay | Admitting: Gastroenterology

## 2024-07-02 NOTE — Telephone Encounter (Signed)
 Good Afternoon Dr. San,  I called this patient at 1:10 and he stated he went to the doctor and they said they were going to cancel his  appointment.  In checking notes it looks like he need a referral sent.  He will not be coming for his procedure he did not prep.

## 2024-07-03 ENCOUNTER — Other Ambulatory Visit: Payer: Self-pay | Admitting: Internal Medicine

## 2024-07-03 DIAGNOSIS — I10 Essential (primary) hypertension: Secondary | ICD-10-CM

## 2024-07-03 NOTE — Telephone Encounter (Signed)
 Altamese: please see the updated referral I submitted to GI.

## 2024-07-03 NOTE — Telephone Encounter (Signed)
 Copied from CRM #[id number]. Topic: Clinical - Medication Refill >> Jul 03, 2024  1:47 PM Sophia H wrote: Medication: metoprolol  succinate (TOPROL -XL) 25 MG 24 hr tablet hydrochlorothiazide  (HYDRODIURIL ) 12.5 MG tablet   Has the patient contacted their pharmacy? Yes, need updated RX  This is the patient's preferred pharmacy:   [facility] - [place], [place] - [address] [place] [address] Phone: [phone] Fax: [phone]  Is this the correct pharmacy for this prescription? Yes If no, delete pharmacy and type the correct one.   Has the prescription been filled recently? Yes  Is the patient out of the medication? Yes  Has the patient been seen for an appointment in the last year OR does the patient have an upcoming appointment? Yes, seen December 5   Can we respond through MyChart? Yes  Agent: Please be advised that Rx refills may take up to 3 business days. We ask that you follow-up with your pharmacy.

## 2024-07-03 NOTE — Telephone Encounter (Signed)
 Patient has only had on late cancellation notice then a no-show.  He does not meet the criteria to discharge him at this point. The earlier appointments were cancelled due to the need for cardiac clearance.

## 2024-07-12 ENCOUNTER — Ambulatory Visit: Payer: MEDICAID | Admitting: Internal Medicine

## 2024-07-13 ENCOUNTER — Other Ambulatory Visit: Payer: Self-pay | Admitting: Internal Medicine

## 2024-07-13 ENCOUNTER — Telehealth: Payer: Self-pay

## 2024-07-13 DIAGNOSIS — Z1211 Encounter for screening for malignant neoplasm of colon: Secondary | ICD-10-CM

## 2024-07-13 DIAGNOSIS — I10 Essential (primary) hypertension: Secondary | ICD-10-CM

## 2024-07-13 NOTE — Addendum Note (Signed)
 Addended by: VICCI SOBER B on: 07/13/2024 05:11 PM   Modules accepted: Orders

## 2024-07-13 NOTE — Telephone Encounter (Signed)
 Copied from CRM #[id number]. Topic: Referral - Question >> Jul 13, 2024 11:29 AM Paul Blake wrote: Reason for CRM: patient is requesting that his pcp sends a updated referral to  [facility] - Gastroenterology. States he missed his appt and now they need another referral sent to get scheduled.. please advise.

## 2024-07-13 NOTE — Telephone Encounter (Signed)
 Called but no answer. LVM informing that referral has been sent to the requested office.

## 2024-07-13 NOTE — Telephone Encounter (Signed)
 Copied from CRM #[id number]. Topic: Clinical - Medication Refill >> Jul 13, 2024 11:55 AM Wess RAMAN wrote: Medication: metoprolol  succinate (TOPROL -XL) 25 MG 24 hr tablet   Has the patient contacted their pharmacy? No (Agent: If no, request that the patient contact the pharmacy for the refill. If patient does not wish to contact the pharmacy document the reason why and proceed with request.) (Agent: If yes, when and what did the pharmacy advise?)  This is the patient's preferred pharmacy:   [facility] - [place], [place] - [address] [place] [address] Phone: [phone] Fax: [phone]  Is this the correct pharmacy for this prescription? Yes If no, delete pharmacy and type the correct one.   Has the prescription been filled recently? Yes  Is the patient out of the medication? No  Has the patient been seen for an appointment in the last year OR does the patient have an upcoming appointment? Yes  Can we respond through MyChart? Yes  Agent: Please be advised that Rx refills may take up to 3 business days. We ask that you follow-up with your pharmacy.

## 2024-07-13 NOTE — Telephone Encounter (Signed)
GI referral submitted.

## 2024-07-17 MED ORDER — METOPROLOL SUCCINATE ER 25 MG PO TB24: 25.0000 mg | ORAL_TABLET | Freq: Every day | ORAL | 0 refills | Status: AC

## 2024-07-17 NOTE — Telephone Encounter (Signed)
 Requested by interface surescripts. Future visit 08/23/24.  Requested Prescriptions  Pending Prescriptions Disp Refills   metoprolol  succinate (TOPROL -XL) 25 MG 24 hr tablet 90 tablet 0    Sig: Take 1 tablet (25 mg total) by mouth daily.     Cardiovascular:  Beta Blockers Passed - 07/17/2024  3:06 PM      Passed - Last BP in normal range    BP Readings from Last 1 Encounters:  06/29/24 128/72         Passed - Last Heart Rate in normal range    Pulse Readings from Last 1 Encounters:  06/29/24 72         Passed - Valid encounter within last 6 months    Recent Outpatient Visits           2 weeks ago Primary hypertension   [place] [facility] [place]. [facility] Barn[facility] NOVAK, MD   4 months ago Chronic right shoulder pain   [place] [facility] Dept Of [place]. [facility] Barnie NOVAK, MD   6 months ago Primary hypertension   [place] [facility] [place] - [facility] Dept Of [place]. [facility] Brien Belvie BRAVO, MD   1 year ago Severe episode of recurrent major depressive disorder, without psychotic features [facility])   [place] [facility] - [facility] Dept Of [place]. [facility] Brien Belvie BRAVO, MD   1 year ago Primary hypertension   [place] [facility] - [facility] Dept Of [place]. [facility] Brien Belvie BRAVO, MD       Future Appointments             In 1 month [place], Adine PARAS., MD [facility] at [facility]

## 2024-08-02 ENCOUNTER — Other Ambulatory Visit: Payer: Self-pay | Admitting: Internal Medicine

## 2024-08-02 NOTE — Telephone Encounter (Signed)
 Copied from CRM #[id number]. Topic: Clinical - Medication Refill >> Aug 02, 2024  1:31 PM Rosaria E wrote: Medication: SYRINGE-NEEDLE, DISP, 3 ML (BD PLASTIPAK SYRINGE) 21G X 1 3 ML MISC  testosterone  cypionate (DEPOTESTOTERONE CYPIONATE) 100 MG/ML injection   Has the patient contacted their pharmacy? Yes (Agent: If no, request that the patient contact the pharmacy for the refill. If patient does not wish to contact the pharmacy document the reason why and proceed with request.) (Agent: If yes, when and what did the pharmacy advise?)  This is the patient's preferred pharmacy:   [facility] - [place], [place] - [address] [place] [address] Phone: [phone] Fax: [phone]  Is this the correct pharmacy for this prescription? Yes If no, delete pharmacy and type the correct one.   Has the prescription been filled recently? Yes  Is the patient out of the medication? Yes  Has the patient been seen for an appointment in the last year OR does the patient have an upcoming appointment? Yes  Can we respond through MyChart? Yes  Agent: Please be advised that Rx refills may take up to 3 business days. We ask that you follow-up with your pharmacy.

## 2024-08-03 NOTE — Telephone Encounter (Signed)
 Requested medication (s) are due for refill today: Yes  Requested medication (s) are on the active medication list: Yes  Last refill:  03/21/24  Future visit scheduled:   Notes to clinic:  Manual review.    Requested Prescriptions  Pending Prescriptions Disp Refills   SYRINGE-NEEDLE, DISP, 3 ML (BD PLASTIPAK SYRINGE) 21G X 1 3 ML MISC 50 each 0    Sig: Use to inject 1 ml of testosterone  IM every 7 days     There is no refill protocol information for this order     testosterone  cypionate (DEPOTESTOTERONE CYPIONATE) 100 MG/ML injection 12 mL 1    Sig: Inject 1 mL (100 mg total) into the muscle every 7 (seven) days. For IM use only     Off-Protocol Failed - 08/03/2024 10:52 AM      Failed - Medication not assigned to a protocol, review manually.      Passed - Valid encounter within last 12 months    Recent Outpatient Visits           1 month ago Primary hypertension   [facility] [place] - A Dept Of [place]. [facility] Vicci Barnie NOVAK, MD   5 months ago Chronic right shoulder pain   [facility] [place] - A Dept Of [place]. [facility] Vicci Barnie NOVAK, MD   7 months ago Primary hypertension   [facility] [place] - A Dept Of [place]. [facility] Brien Belvie BRAVO, MD   1 year ago Severe episode of recurrent major depressive disorder, without psychotic features [facility])   [facility] - A Dept Of [place]. [facility] Brien Belvie BRAVO, MD   1 year ago Primary hypertension   [facility] [facility] [place] - A Dept Of [place]. [facility] Brien Belvie BRAVO, MD       Future Appointments             In 1 week Stoneking, Adine PARAS., MD [facility] Urology at [facility]

## 2024-08-04 MED ORDER — "BD PLASTIPAK SYRINGE 21G X 1"" 3 ML MISC": 0 refills | Status: AC

## 2024-08-04 MED ORDER — TESTOSTERONE CYPIONATE 100 MG/ML IM SOLN: 100.0000 mg | INTRAMUSCULAR | 1 refills | Status: DC

## 2024-08-08 ENCOUNTER — Other Ambulatory Visit: Payer: Self-pay | Admitting: Internal Medicine

## 2024-08-08 DIAGNOSIS — M545 Low back pain, unspecified: Secondary | ICD-10-CM

## 2024-08-08 DIAGNOSIS — G8929 Other chronic pain: Secondary | ICD-10-CM

## 2024-08-08 NOTE — Telephone Encounter (Signed)
 Copied from CRM [id number]. Topic: Clinical - Medication Refill >> Aug 08, 2024  3:20 PM Macario HERO wrote: Medication: meloxicam  (MOBIC ) 15 MG tablet [[id number]]  Has the patient contacted their pharmacy? No (Agent: If no, request that the patient contact the pharmacy for the refill. If patient does not wish to contact the pharmacy document the reason why and proceed with request.) (Agent: If yes, when and what did the pharmacy advise?)  This is the patient's preferred pharmacy:   [facility] - [place], [place] - [address] [place] [address] Phone: [phone] Fax: [phone]  Is this the correct pharmacy for this prescription? Yes If no, delete pharmacy and type the correct one.   Has the prescription been filled recently? Yes  Is the patient out of the medication? No, soon  Has the patient been seen for an appointment in the last year OR does the patient have an upcoming appointment? Yes  Can we respond through MyChart? Yes  Agent: Please be advised that Rx refills may take up to 3 business days. We ask that you follow-up with your pharmacy.

## 2024-08-09 MED ORDER — MELOXICAM 15 MG PO TABS: 15.0000 mg | ORAL_TABLET | Freq: Every day | ORAL | 0 refills | Status: AC

## 2024-08-09 NOTE — Telephone Encounter (Signed)
 Requested Prescriptions  Pending Prescriptions Disp Refills   meloxicam  (MOBIC ) 15 MG tablet 30 tablet 0    Sig: Take 1 tablet (15 mg total) by mouth daily.     Analgesics:  COX2 Inhibitors Failed - 08/09/2024  3:45 PM      Failed - Manual Review: Labs are only required if the patient has taken medication for more than 8 weeks.      Failed - HCT in normal range and within 360 days    Hematocrit  Date Value Ref Range Status  06/29/2024 54.6 (H) 37.5 - 51.0 % Final         Failed - eGFR is 30 or above and within 360 days    GFR, Est African American  Date Value Ref Range Status  09/25/2020 118 > OR = 60 mL/min/1.39m2 Final   GFR, Est Non African American  Date Value Ref Range Status  09/25/2020 101 > OR = 60 mL/min/1.58m2 Final   GFR, Estimated  Date Value Ref Range Status  08/17/2021 >60 >60 mL/min Final    Comment:    (NOTE) Calculated using the CKD-EPI Creatinine Equation (2021)    eGFR  Date Value Ref Range Status  11/04/2022 88 > OR = 60 mL/min/1.57m2 Final  06/23/2022 89 >59 mL/min/1.73 Final         Passed - HGB in normal range and within 360 days    Hemoglobin  Date Value Ref Range Status  06/29/2024 17.5 13.0 - 17.7 g/dL Final         Passed - Cr in normal range and within 360 days    Creat  Date Value Ref Range Status  12/01/2023 0.99 0.70 - 1.30 mg/dL Final         Passed - AST in normal range and within 360 days    AST  Date Value Ref Range Status  12/01/2023 20 10 - 35 U/L Final         Passed - ALT in normal range and within 360 days    ALT  Date Value Ref Range Status  12/01/2023 26 9 - 46 U/L Final         Passed - Patient is not pregnant      Passed - Valid encounter within last 12 months    Recent Outpatient Visits           1 month ago Primary hypertension   [facility] - A Dept Of [place]. [facility] Vicci Barnie NOVAK, MD   5 months ago Chronic right shoulder pain   [facility] [facility] [place]  - A Dept Of [place]. [facility] Vicci Barnie NOVAK, MD   7 months ago Primary hypertension   [facility] [place] - A Dept Of [place]. [facility] Brien Belvie BRAVO, MD   1 year ago Severe episode of recurrent major depressive disorder, without psychotic features [facility])   [place] [facility] - A Dept Of [place]. [facility] Brien Belvie BRAVO, MD   1 year ago Primary hypertension   [place] [facility] [place] - A Dept Of [place]. [facility] Brien Belvie BRAVO, MD       Future Appointments             In 1 week Stoneking, Adine PARAS., MD [facility] Urology at [facility]

## 2024-08-16 ENCOUNTER — Encounter: Payer: Self-pay | Admitting: Urology

## 2024-08-16 ENCOUNTER — Ambulatory Visit: Payer: MEDICAID | Admitting: Urology

## 2024-08-16 VITALS — BP 135/76 | HR 68 | Ht 70.0 in | Wt 202.0 lb

## 2024-08-16 DIAGNOSIS — D751 Secondary polycythemia: Secondary | ICD-10-CM | POA: Insufficient documentation

## 2024-08-16 DIAGNOSIS — E291 Testicular hypofunction: Secondary | ICD-10-CM

## 2024-08-16 MED ORDER — TLANDO 112.5 MG PO CAPS: 2.0000 | ORAL_CAPSULE | Freq: Two times a day (BID) | ORAL | 5 refills | Status: AC

## 2024-08-16 NOTE — Progress Notes (Signed)
 "  Assessment: 1. Male hypogonadism   2. Erythrocytosis     Plan: I personally reviewed the patient's chart including provider notes, lab results. I discussed options for management of hypogonadism including topical therapy, oral therapy, short acting injections, long-acting injections, and subcutaneous pellets.  Potential risk and side effects of testosterone  replacement therapy discussed specifically erythrocytosis. I recommended that he consider changing to topical or oral therapy as these treatments have a lower risk of erythrocytosis than IM injections. Following our discussion, he would like to try oral therapy. Rx for Tilando 225 mg BID with meals sent [facility] Will need repeat testosterone  level and CBC after 4-6 weeks  Chief Complaint:  Chief Complaint  Patient presents with   Hypogonadism    History of Present Illness:  Paul Blake is a 59 y.o. male who is seen in consultation from Vicci Barnie NOVAK, MD for evaluation of male hypogonadism. He was diagnosed with hypogonadism in October 2023 and has been managed with testosterone  injections 100 mg weekly.  Testosterone  levels: 10/23 177 10/24 222 12/25 1433  H&H 12/25: 17.5/54.6 PSA 12/25: 0.4  He was recently changed to injections every 2 weeks.  He last took an injection yesterday. Prior to starting testosterone  therapy, he noted a lack of energy, extreme fatigue, and decreased libido.  No problem with erectile dysfunction.  He has seen improvement in his symptoms with the testosterone  injections.    He is not having any lower urinary tract symptoms.  No dysuria or gross hematuria.   Past Medical History:  Past Medical History:  Diagnosis Date   HIV infection (HCC)    Hypertension    Substance abuse (HCC)    Tuberculosis     Past Surgical History:  Past Surgical History:  Procedure Laterality Date   CARDIAC PACEMAKER PLACEMENT Left 12/2021   no past surgery       Allergies:   Allergies[1]  Family History:  Family History  Problem Relation Age of Onset   Colon cancer Neg Hx    Rectal cancer Neg Hx    Stomach cancer Neg Hx    Esophageal cancer Neg Hx     Social History:  Social History[2]  Review of symptoms:  Constitutional:  Negative for unexplained weight loss, night sweats, fever, chills ENT:  Negative for nose bleeds, sinus pain, painful swallowing CV:  Negative for chest pain, shortness of breath, exercise intolerance, palpitations, loss of consciousness Resp:  Negative for cough, wheezing, shortness of breath GI:  Negative for nausea, vomiting, diarrhea, bloody stools GU:  Positives noted in HPI; otherwise negative for gross hematuria, dysuria, urinary incontinence Neuro:  Negative for seizures, poor balance, limb weakness, slurred speech Psych:  Negative for lack of energy, depression, anxiety Endocrine:  Negative for polydipsia, polyuria, symptoms of hypoglycemia (dizziness, hunger, sweating) Hematologic:  Negative for anemia, purpura, petechia, prolonged or excessive bleeding, use of anticoagulants  Allergic:  Negative for difficulty breathing or choking as a result of exposure to anything; no shellfish allergy; no allergic response (rash/itch) to materials, foods  Physical exam: BP 135/76   Pulse 68   Ht 5' 10 (1.778 m)   Wt 202 lb (91.6 kg)   BMI 28.98 kg/m  GENERAL APPEARANCE:  Well appearing, well developed, well nourished, NAD HEENT:  Atraumatic, normocephalic, oropharynx clear NECK:  Supple without lymphadenopathy or thyromegaly ABDOMEN:  Soft, non-tender, no masses EXTREMITIES:  Moves all extremities well, without clubbing, cyanosis, or edema NEUROLOGIC:  Alert and oriented x 3, normal gait,  CN II-XII grossly intact MENTAL STATUS:  appropriate BACK:  Non-tender to palpation, No CVAT SKIN:  Warm, dry, and intact    Results: None     [1] No Known Allergies [2]  Social History Tobacco Use   Smoking status: Former     Current packs/day: 0.50    Types: Cigars, Cigarettes    Passive exposure: Never   Smokeless tobacco: Former   Tobacco comments:    Smokes 1 cigar/day.    Quit smoking cigarettes    Vaping sometimes as of 12/01/23  Vaping Use   Vaping status: Some Days   Substances: Nicotine, Flavoring  Substance Use Topics   Alcohol use: Not Currently    Comment: Last date of use 01.21.2025   Drug use: Not Currently    Types: Crack cocaine    Comment: last  date of use 01.21.2025   "

## 2024-08-17 ENCOUNTER — Telehealth: Payer: Self-pay | Admitting: Internal Medicine

## 2024-08-17 NOTE — Telephone Encounter (Signed)
 1st and 2nd attempt call cannot be comp as dial contacted pt to resch appt due to weather (if pt calls back please resch appt)

## 2024-08-20 ENCOUNTER — Ambulatory Visit: Payer: MEDICAID

## 2024-08-22 ENCOUNTER — Other Ambulatory Visit: Payer: Self-pay | Admitting: Internal Medicine

## 2024-08-22 DIAGNOSIS — E291 Testicular hypofunction: Secondary | ICD-10-CM

## 2024-08-22 NOTE — Telephone Encounter (Unsigned)
 Copied from CRM #[id number]. Topic: Clinical - Prescription Issue >> Aug 22, 2024 12:06 PM Travis F wrote: Reason for CRM: Patient is calling in because he says his medication was sent to the wrong pharmacy. His Testosterone  Undecanoate (TLANDO ) 112.5 MG CAPS [[id number]] is supposed to be sent to [facility] - [place], [place] - [address] [place] [address] Phone: [phone] Fax: [phone] Hours: Not open 24 hours

## 2024-08-23 ENCOUNTER — Ambulatory Visit: Payer: MEDICAID | Admitting: Internal Medicine

## 2024-08-23 NOTE — Telephone Encounter (Signed)
 Requested medications are due for refill today.  No - pt wants rx transferred to a different pharmacy  Requested medications are on the active medications list.  yes  Last refill. 08/16/2024  #120 5 rf  Future visit scheduled.   yes  Notes to clinic.  Please review for trnasfer    Requested Prescriptions  Pending Prescriptions Disp Refills   Testosterone  Undecanoate (TLANDO ) 112.5 MG CAPS 120 capsule 5    Sig: Take 2 tablets by mouth 2 (two) times daily with a meal.     Off-Protocol Failed - 08/23/2024  1:37 PM      Failed - Medication not assigned to a protocol, review manually.      Passed - Valid encounter within last 12 months    Recent Outpatient Visits           1 month ago Primary hypertension   [facility] [place] - A Dept Of [place]. [facility] Vicci Barnie NOVAK, MD   6 months ago Chronic right shoulder pain   [facility] [place] - A Dept Of [place]. [facility] Vicci Barnie NOVAK, MD   7 months ago Primary hypertension   [facility] [facility] [place] - A Dept Of [place]. [facility] Brien Belvie BRAVO, MD   1 year ago Severe episode of recurrent major depressive disorder, without psychotic features [facility])   [facility] [facility] - A Dept Of [place]. [facility] Brien Belvie BRAVO, MD   1 year ago Primary hypertension   [place] [facility] [facility] [place] - A Dept Of [place]. [facility] Brien Belvie BRAVO, MD       Future Appointments             In 1 month Stoneking, Adine PARAS., MD [facility]rology at [facility]

## 2024-08-29 ENCOUNTER — Encounter: Payer: Self-pay | Admitting: Gastroenterology

## 2024-10-04 ENCOUNTER — Ambulatory Visit: Payer: MEDICAID | Admitting: Urology

## 2024-10-29 ENCOUNTER — Ambulatory Visit: Payer: MEDICAID | Admitting: Internal Medicine
